# Patient Record
Sex: Male | Born: 1935 | Race: White | Hispanic: No | Marital: Married | Smoking: Former smoker
Health system: Southern US, Community
[De-identification: ages and names within clinical notes are randomized; demographics above are authoritative.]

## PROBLEM LIST (undated history)

## (undated) DIAGNOSIS — E538 Deficiency of other specified B group vitamins: Secondary | ICD-10-CM

## (undated) DIAGNOSIS — F419 Anxiety disorder, unspecified: Secondary | ICD-10-CM

## (undated) DIAGNOSIS — IMO0001 Reserved for inherently not codable concepts without codable children: Secondary | ICD-10-CM

## (undated) DIAGNOSIS — E785 Hyperlipidemia, unspecified: Secondary | ICD-10-CM

## (undated) DIAGNOSIS — Z992 Dependence on renal dialysis: Secondary | ICD-10-CM

## (undated) DIAGNOSIS — E669 Obesity, unspecified: Secondary | ICD-10-CM

## (undated) DIAGNOSIS — Z9289 Personal history of other medical treatment: Secondary | ICD-10-CM

## (undated) DIAGNOSIS — I10 Essential (primary) hypertension: Secondary | ICD-10-CM

## (undated) DIAGNOSIS — I2510 Atherosclerotic heart disease of native coronary artery without angina pectoris: Secondary | ICD-10-CM

## (undated) DIAGNOSIS — G4733 Obstructive sleep apnea (adult) (pediatric): Secondary | ICD-10-CM

## (undated) DIAGNOSIS — I255 Ischemic cardiomyopathy: Secondary | ICD-10-CM

## (undated) DIAGNOSIS — K219 Gastro-esophageal reflux disease without esophagitis: Secondary | ICD-10-CM

## (undated) DIAGNOSIS — M4800 Spinal stenosis, site unspecified: Secondary | ICD-10-CM

## (undated) DIAGNOSIS — D649 Anemia, unspecified: Secondary | ICD-10-CM

## (undated) DIAGNOSIS — N186 End stage renal disease: Secondary | ICD-10-CM

## (undated) DIAGNOSIS — R251 Tremor, unspecified: Secondary | ICD-10-CM

## (undated) DIAGNOSIS — N200 Calculus of kidney: Secondary | ICD-10-CM

## (undated) DIAGNOSIS — M1990 Unspecified osteoarthritis, unspecified site: Secondary | ICD-10-CM

## (undated) HISTORY — PX: CATARACT EXTRACTION: SUR2

## (undated) HISTORY — DX: Atherosclerotic heart disease of native coronary artery without angina pectoris: I25.10

## (undated) HISTORY — PX: CARDIAC CATHETERIZATION: SHX172

## (undated) HISTORY — PX: EYE SURGERY: SHX253

## (undated) HISTORY — DX: Essential (primary) hypertension: I10

## (undated) HISTORY — DX: Deficiency of other specified B group vitamins: E53.8

## (undated) HISTORY — DX: Dependence on renal dialysis: N18.6

## (undated) HISTORY — DX: Hyperlipidemia, unspecified: E78.5

## (undated) HISTORY — PX: BACK SURGERY: SHX140

## (undated) HISTORY — DX: Calculus of kidney: N20.0

## (undated) HISTORY — DX: Obstructive sleep apnea (adult) (pediatric): G47.33

## (undated) HISTORY — DX: Spinal stenosis, site unspecified: M48.00

## (undated) HISTORY — DX: Personal history of other medical treatment: Z92.89

## (undated) HISTORY — DX: Ischemic cardiomyopathy: I25.5

## (undated) HISTORY — PX: JOINT REPLACEMENT: SHX530

## (undated) HISTORY — DX: End stage renal disease: Z99.2

## (undated) HISTORY — DX: Obesity, unspecified: E66.9

## (undated) HISTORY — PX: INSERTION OF DIALYSIS CATHETER: SHX1324

---

## 1997-08-22 HISTORY — PX: CORONARY ARTERY BYPASS GRAFT: SHX141

## 2005-08-25 ENCOUNTER — Ambulatory Visit: Payer: Self-pay | Admitting: Gastroenterology

## 2005-09-10 ENCOUNTER — Emergency Department: Payer: Self-pay | Admitting: Emergency Medicine

## 2005-09-13 ENCOUNTER — Emergency Department: Payer: Self-pay | Admitting: Emergency Medicine

## 2005-09-14 ENCOUNTER — Ambulatory Visit: Payer: Self-pay | Admitting: Urology

## 2005-09-14 ENCOUNTER — Other Ambulatory Visit: Payer: Self-pay

## 2006-09-05 ENCOUNTER — Ambulatory Visit: Payer: Self-pay | Admitting: Internal Medicine

## 2008-12-20 HISTORY — PX: TOTAL KNEE ARTHROPLASTY: SHX125

## 2008-12-23 ENCOUNTER — Encounter: Payer: Self-pay | Admitting: Cardiology

## 2008-12-29 ENCOUNTER — Ambulatory Visit: Payer: Self-pay | Admitting: General Practice

## 2008-12-31 ENCOUNTER — Encounter: Payer: Self-pay | Admitting: Cardiology

## 2009-01-06 ENCOUNTER — Ambulatory Visit: Payer: Self-pay | Admitting: Internal Medicine

## 2009-01-06 ENCOUNTER — Encounter: Payer: Self-pay | Admitting: Cardiology

## 2009-01-08 ENCOUNTER — Ambulatory Visit: Payer: Self-pay | Admitting: Ophthalmology

## 2009-01-12 ENCOUNTER — Inpatient Hospital Stay: Payer: Self-pay | Admitting: General Practice

## 2009-02-17 ENCOUNTER — Encounter: Payer: Self-pay | Admitting: Cardiology

## 2009-03-11 ENCOUNTER — Encounter: Payer: Self-pay | Admitting: Cardiology

## 2009-03-12 ENCOUNTER — Encounter: Payer: Self-pay | Admitting: Cardiology

## 2009-03-31 ENCOUNTER — Ambulatory Visit: Payer: Self-pay | Admitting: Ophthalmology

## 2009-06-09 ENCOUNTER — Encounter: Payer: Self-pay | Admitting: Cardiology

## 2009-06-22 DIAGNOSIS — Z9289 Personal history of other medical treatment: Secondary | ICD-10-CM

## 2009-06-22 HISTORY — DX: Personal history of other medical treatment: Z92.89

## 2009-07-02 ENCOUNTER — Ambulatory Visit: Payer: Self-pay | Admitting: Cardiology

## 2009-07-02 DIAGNOSIS — E785 Hyperlipidemia, unspecified: Secondary | ICD-10-CM | POA: Insufficient documentation

## 2009-07-02 DIAGNOSIS — I25810 Atherosclerosis of coronary artery bypass graft(s) without angina pectoris: Secondary | ICD-10-CM | POA: Insufficient documentation

## 2009-07-02 DIAGNOSIS — I10 Essential (primary) hypertension: Secondary | ICD-10-CM | POA: Insufficient documentation

## 2009-07-08 LAB — CONVERTED CEMR LAB
BUN: 28 mg/dL — ABNORMAL HIGH (ref 6–23)
Chloride: 104 meq/L (ref 96–112)
Glucose, Bld: 109 mg/dL — ABNORMAL HIGH (ref 70–99)
Potassium: 4.9 meq/L (ref 3.5–5.3)
Sodium: 138 meq/L (ref 135–145)

## 2009-07-09 ENCOUNTER — Ambulatory Visit (HOSPITAL_COMMUNITY): Admission: RE | Admit: 2009-07-09 | Discharge: 2009-07-09 | Payer: Self-pay | Admitting: Cardiology

## 2009-07-09 ENCOUNTER — Ambulatory Visit: Payer: Self-pay | Admitting: Cardiology

## 2009-07-17 ENCOUNTER — Ambulatory Visit: Payer: Self-pay | Admitting: Cardiology

## 2009-07-17 DIAGNOSIS — R0609 Other forms of dyspnea: Secondary | ICD-10-CM

## 2009-07-17 DIAGNOSIS — R0602 Shortness of breath: Secondary | ICD-10-CM

## 2009-07-17 DIAGNOSIS — R0989 Other specified symptoms and signs involving the circulatory and respiratory systems: Secondary | ICD-10-CM

## 2009-07-31 ENCOUNTER — Ambulatory Visit: Payer: Self-pay | Admitting: Cardiology

## 2009-08-02 ENCOUNTER — Encounter: Payer: Self-pay | Admitting: Cardiology

## 2009-08-03 LAB — CONVERTED CEMR LAB
Albumin: 4.2 g/dL (ref 3.5–5.2)
Alkaline Phosphatase: 50 units/L (ref 39–117)
BUN: 24 mg/dL — ABNORMAL HIGH (ref 6–23)
CO2: 24 meq/L (ref 19–32)
Cholesterol: 142 mg/dL (ref 0–200)
Glucose, Bld: 159 mg/dL — ABNORMAL HIGH (ref 70–99)
HDL: 48 mg/dL (ref >39)
LDL Cholesterol: 69 mg/dL (ref 0–99)
Total Bilirubin: 0.5 mg/dL (ref 0.3–1.2)
Triglycerides: 125 mg/dL (ref <150)
VLDL: 25 mg/dL (ref 0–40)

## 2009-08-10 ENCOUNTER — Ambulatory Visit: Payer: Self-pay | Admitting: Cardiovascular Disease

## 2009-08-10 ENCOUNTER — Encounter: Payer: Self-pay | Admitting: Cardiology

## 2009-08-19 ENCOUNTER — Encounter: Payer: Self-pay | Admitting: Cardiology

## 2009-08-19 ENCOUNTER — Ambulatory Visit: Payer: Self-pay | Admitting: Cardiology

## 2009-08-19 ENCOUNTER — Telehealth: Payer: Self-pay | Admitting: Cardiology

## 2009-08-19 DIAGNOSIS — G4733 Obstructive sleep apnea (adult) (pediatric): Secondary | ICD-10-CM | POA: Insufficient documentation

## 2009-08-19 DIAGNOSIS — Z9989 Dependence on other enabling machines and devices: Secondary | ICD-10-CM

## 2009-08-24 LAB — CONVERTED CEMR LAB
BUN: 29 mg/dL — ABNORMAL HIGH (ref 6–23)
Calcium: 8.9 mg/dL (ref 8.4–10.5)
Glucose, Bld: 126 mg/dL — ABNORMAL HIGH (ref 70–99)
Potassium: 5.2 meq/L (ref 3.5–5.3)

## 2009-08-28 ENCOUNTER — Ambulatory Visit: Payer: Self-pay | Admitting: Cardiology

## 2009-09-11 ENCOUNTER — Encounter: Payer: Self-pay | Admitting: Cardiology

## 2009-09-11 ENCOUNTER — Ambulatory Visit: Payer: Self-pay | Admitting: Cardiovascular Disease

## 2009-09-14 LAB — CONVERTED CEMR LAB
Calcium: 8.8 mg/dL (ref 8.4–10.5)
Glucose, Bld: 200 mg/dL — ABNORMAL HIGH (ref 70–99)
Potassium: 5.2 meq/L (ref 3.5–5.3)
Sodium: 138 meq/L (ref 135–145)

## 2009-09-16 LAB — CONVERTED CEMR LAB
BUN: 29 mg/dL — ABNORMAL HIGH (ref 6–23)
Chloride: 100 meq/L (ref 96–112)
Creatinine, Ser: 1.15 mg/dL (ref 0.40–1.50)
Glucose, Bld: 122 mg/dL — ABNORMAL HIGH (ref 70–99)
Potassium: 5.6 meq/L — ABNORMAL HIGH (ref 3.5–5.3)

## 2009-09-25 ENCOUNTER — Ambulatory Visit: Payer: Self-pay | Admitting: Cardiovascular Disease

## 2009-09-25 ENCOUNTER — Encounter: Payer: Self-pay | Admitting: Cardiology

## 2009-10-08 ENCOUNTER — Encounter: Payer: Self-pay | Admitting: Cardiology

## 2009-10-09 ENCOUNTER — Telehealth (INDEPENDENT_AMBULATORY_CARE_PROVIDER_SITE_OTHER): Payer: Self-pay | Admitting: *Deleted

## 2009-10-14 LAB — CONVERTED CEMR LAB
CO2: 22 meq/L (ref 19–32)
Chloride: 106 meq/L (ref 96–112)
Glucose, Bld: 136 mg/dL — ABNORMAL HIGH (ref 70–99)
Potassium: 5.7 meq/L — ABNORMAL HIGH (ref 3.5–5.3)
Sodium: 138 meq/L (ref 135–145)

## 2009-10-16 ENCOUNTER — Ambulatory Visit: Payer: Self-pay | Admitting: Cardiovascular Disease

## 2009-10-16 ENCOUNTER — Encounter: Payer: Self-pay | Admitting: Cardiology

## 2009-10-19 LAB — CONVERTED CEMR LAB
BUN: 25 mg/dL — ABNORMAL HIGH (ref 6–23)
CO2: 21 meq/L (ref 19–32)
Calcium: 9.2 mg/dL (ref 8.4–10.5)
Chloride: 102 meq/L (ref 96–112)
Creatinine, Ser: 1.38 mg/dL (ref 0.40–1.50)
Glucose, Bld: 237 mg/dL — ABNORMAL HIGH (ref 70–99)

## 2010-09-21 NOTE — Assessment & Plan Note (Signed)
Summary: F6W/AMD   Visit Type:  Follow-up Referring Provider:  self Primary Provider:  Dr [facility]  CC:  no cp, some sob, no edema, and blood pressure been up a little.  History of Present Illness: 77 yo with history of CAD s/p CABG, mild ischemic cardiomyopathy, and HTN returns followup.  Patient had CABG in 1999 with LIMA-LAD and SVG-OM1.  Symptoms prior to CABG were fatigue and exertional shortness of breath, no chest pain.  Patient did well for a number of years, but for the last couple of years he has noted some dyspnea on exertion.  He was scheduled for knee replacement this summer, and prior to the procedure, he had a [place] myoview done.  This showed EF 45% with lateral hypokinesis and a mixture of ischemia and infarction in the inferolateral and inferoapical walls.  Left heart cath showed totally occluded SVG-OM1 with patent LIMA-LAD.  There was 99% proximal LAD stenosis and 80% ostial left main stenosis.  The circumflex system had only nonobstructive disease.  The patient had his knee replacement in May with no complications.    I reviewed the left heart cath from [facility].  There is 70-80% ostial left main stenosis that would probably be amenable to stenting.  The LAD is protected by a patent LIMA.  Cardiac MRI showed EF 48% and akinetic mid inferolateral and mid anterolateral walls as well as the apex.  There was > 50% thickness subendocardial delayed enhancement in those walls.  This likely corresponds to circumflex distribution.    Patient is active again after surgery.  He does not get chest pain but did not get chest pain before CABG.  Prior to CABG, he had severe DOE (worse than his current symptoms).   His exercise tolerance is getting better as he exercises more (post-knee replacement).  He has been doing yardwork and is able to cut wood with a chainsaw.  He can walk on flat ground without trouble but has dyspnea with stairs.  He is tired all day.  He falls  asleep easily and per his wife, he snores loudly.  Sleep study showed moderate OSA and he is going to pick up CPAP machine on Monday.   Labs (11/10): creatinine 1.25 Labs (12/10): LDL 69, HDL 48 Labs (1/11): creatinine 1.2, K 5.2  Current Medications (verified): 1)  Aspirin 81 Mg Tbec (Aspirin) .... Take One Tablet By Mouth Daily 2)  Lisinopril 40 Mg Tabs (Lisinopril) .... Take One Tablet By Mouth Daily 3)  Metformin Hcl 1000 Mg Tabs (Metformin Hcl) .Marland Kitchen.. 1 By Mouth Two Times A Day 4)  Metoprolol Succinate 50 Mg Xr24h-Tab (Metoprolol Succinate) .... Take One Tablet By Mouth Daily 5)  Glipizide 5 Mg Tabs (Glipizide) .Marland Kitchen.. 1 By Mouth Two Times A Day 6)  Lovastatin 20 Mg Tabs (Lovastatin) .... Take One Tablet By Mouth Daily At Bedtime 7)  Cyanocobalamin 1000 Mcg/ml Soln (Cyanocobalamin) .Marland Kitchen.. 1 By Mouth Once Daily 8)  Leg Cramps Otc .Marland Kitchen.. 1 By Mouth Once Daily As Needed 9)  Imdur 30 Mg Xr24h-Tab (Isosorbide Mononitrate) .Marland Kitchen.. 1 Tab By Mouth Daily  Allergies (verified): 1)  ! * Beef 2)  ! * Ivp Dye 3)  ! Iodine  Past History:  Past Medical History: 1. DM 2. Hypertension 3. Hyperlipidemia 4. CAD: CABG 1999 in [place] with LIMA-LAD and SVG-OM1.  Lexiscan myoview (5/10) was done pre-op knee surgery.  This showed EF 45%, hypokinetic apex and lateral wall, fixed perfusion defect involving the apex, inferoapex, and  inferolateral wall.  There also was some inferoapical ischemia.  LHC (5/10) showed patent LIMA-LAD and totally occluded SVG-OM1, 99% pLAD, 40% ostial CFX, 40% mCFX, 30% OM1, 20% pRCA.  Cardiac MRI (11/10): EF 48%, mid anterolateral and inferolateral walls, apical lateral wall, apical septal wall, and true apex were all thin and akinetic.  51-75% thickness subendocardial delayed enhancement in the mid anterolateral and inferolateral walls, full thickness enhancement in the apical lateral and apical septal walls and the true apex.   5. Nephrolithiasis 6. Spinal stenosis with history of  back surgery.  7. TKR 5/10 8. B12 deficiency 9. Obesity 10.  Moderate OSA  Family History: Reviewed history from 07/02/2009 and no changes required. Father: deceased 49; heart failure Mother: deceased 26; renal failure  Social History: Reviewed history from 07/02/2009 and no changes required. Tobacco Use - Former.  Alcohol Use - no Retired, lives in [place]  Married, 1 daughter in [place] and 3 grandchildren.  Daughter has a country music band.   Review of Systems       All systems reviewed and negative except as per HPI.   Vital Signs:  Patient profile:   77 year old male Height:      67 inches Weight:      198.50 pounds BMI:     31.20 Pulse rate:   88 / minute Pulse rhythm:   regular BP sitting:   147 / 70  (left arm) Cuff size:   regular  Vitals Entered By: Mercer Pod (August 28, 2009 9:39 AM)  Physical Exam  General:  Well developed, well nourished, in no acute distress. Obese.  Neck:  Neck supple, no JVD. No masses, thyromegaly or abnormal cervical nodes. Lungs:  Clear bilaterally to auscultation and percussion. Heart:  Non-displaced PMI, chest non-tender; regular rate and rhythm, S1, S2 without rubs or gallops. 1/6 HSM at apex.  Carotid upstroke normal, no bruit. 1+ PT pulses bilaterally. No edema, no varicosities. Abdomen:  Bowel sounds positive; abdomen soft and non-tender without masses, organomegaly, or hernias noted. No hepatosplenomegaly. Extremities:  No clubbing or cyanosis. Neurologic:  Alert and oriented x 3. Psych:  Normal affect.   Impression & Recommendations:  Problem # 1:  CORONARY ATHEROSCLEROSIS OF ARTERY BYPASS GRAFT (ICD-414.04) Kenneth [name] was found on recent left heart cath to have an occluded graft to OM1.  His LIMA-LAD was patent.  His circumflex system is now supplied by a left main with 80% stenosis.  It is certainly possible that he is ischemic in the circumflex territory and this could be causing symptoms.  Lexiscan myoview  from 5/10 showed primarily fixed defect but some reversibility in the inferolateral and inferoapical walls.  The possibility of intervention on the left main has been raised to the patient in the past.  Upon reviewing the catheterization, I think this would be feasible, especially as he has a protected LAD (LIMA).  Cardiac MRI showed EF 48% with > 50% thickness subendocardial delayed enhancement in the mid anterolateral, mid inferolateral, apical lateral, and apical septal segments.  These segments would probably be unlikely to show significant function recovery with revascularization.  I am somewhat surprised about the degree of infarction in the circumflex territory as there is good flow at rest down this vessel.  Kenneth [name] has been doing well lately and his exercise tolerance is improving gradually over time as he gets more active after knee surgery.  I am not convinced that he is having ischemic symptoms.  I would hold off  on any intervention at this time.  I think he will feel better after starting CPAP as well given his significant OSA.    Problem # 2:  CARDIOMYOPATHY, ISCHEMIC (ICD-414.8) EF 48% on cardiac MRI, mildly decreased.  He is on Toprol XL and ACEI.   Problem # 3:  SLEEP APNEA (ICD-780.57) Moderate OSA.  He will pick up his CPAP on Monday.  Hopefully this will improve his fatigue/tiredness.   Problem # 4:  HYPERTENSION, UNSPECIFIED (ICD-401.9) BP has been running high, both here and at home.  Add chlorthalidone 12.5 mg daily (will help lower his potassium level as well).  BP check and BMET in 2 weeks, can titrate up to 25 mg daily if BP still elevated.   Problem # 5:  HYPERLIPIDEMIA-MIXED (ICD-272.4) Lipids at goal (LDL < 70) when last checked.   Patient Instructions: 1)  Your physician recommends that you schedule a follow-up appointment in: 4 months 2)  Your physician recommends that you return for lab work in: 2 weeks (BMET) 3)  Your physician has recommended you make the  following change in your medication: start chlorthiadone 12.5 mg daily 4)  Your physician has requested that you regularly monitor and record your blood pressure readings at home.  Please use the same machine at the same time of day to check your readings and record them to bring to your lab visit. Prescriptions: CHLORTHALIDONE 25 MG TABS (CHLORTHALIDONE) Take 1/2 tablet by mouth daily  #30 x 6   Entered by:   Charlena Cross, RN, BSN   Authorized by:   Marca Ancona, MD   Signed by:   Charlena Cross, RN, BSN on 08/28/2009   Method used:   Electronically to        [facility] [phone]* (retail)       [address]       [place], [place]  [address]       Ph: [phone]       Fax: [phone]   RxID:   [id number]

## 2010-09-21 NOTE — Progress Notes (Signed)
  Recieved request form [facility] forwarded to [facility] for processing [facility]  October 09, 2009 8:22 AM

## 2011-03-10 ENCOUNTER — Encounter: Payer: Self-pay | Admitting: Cardiology

## 2011-08-02 ENCOUNTER — Emergency Department: Payer: Self-pay | Admitting: Emergency Medicine

## 2011-11-16 ENCOUNTER — Ambulatory Visit: Payer: Self-pay | Admitting: Urology

## 2011-11-16 LAB — POTASSIUM: Potassium: 4 mmol/L (ref 3.5–5.1)

## 2012-02-26 ENCOUNTER — Emergency Department: Payer: Self-pay | Admitting: Emergency Medicine

## 2012-02-26 LAB — LIPASE, BLOOD: Lipase: 166 U/L (ref 73–393)

## 2012-02-26 LAB — URINALYSIS, COMPLETE
Bilirubin,UR: NEGATIVE
Blood: NEGATIVE
Glucose,UR: 50 mg/dL (ref 0–75)
Specific Gravity: 1.024 (ref 1.003–1.030)
Squamous Epithelial: NONE SEEN

## 2012-02-26 LAB — CBC
HCT: 37.5 % — ABNORMAL LOW (ref 40.0–52.0)
HGB: 12.5 g/dL — ABNORMAL LOW (ref 13.0–18.0)
MCH: 31.5 pg (ref 26.0–34.0)
MCHC: 33.3 g/dL (ref 32.0–36.0)
MCV: 95 fL (ref 80–100)
RBC: 3.97 10*6/uL — ABNORMAL LOW (ref 4.40–5.90)

## 2012-02-26 LAB — COMPREHENSIVE METABOLIC PANEL
Alkaline Phosphatase: 74 U/L (ref 50–136)
BUN: 31 mg/dL — ABNORMAL HIGH (ref 7–18)
Bilirubin,Total: 0.5 mg/dL (ref 0.2–1.0)
Chloride: 105 mmol/L (ref 98–107)
Creatinine: 2.75 mg/dL — ABNORMAL HIGH (ref 0.60–1.30)
SGPT (ALT): 14 U/L
Total Protein: 7.4 g/dL (ref 6.4–8.2)

## 2012-02-28 ENCOUNTER — Ambulatory Visit: Payer: Self-pay | Admitting: Urology

## 2012-02-29 ENCOUNTER — Inpatient Hospital Stay: Payer: Self-pay | Admitting: Urology

## 2012-02-29 LAB — CBC
MCH: 31.2 pg (ref 26.0–34.0)
MCHC: 33 g/dL (ref 32.0–36.0)
Platelet: 221 10*3/uL (ref 150–440)

## 2012-02-29 LAB — BASIC METABOLIC PANEL
Calcium, Total: 8.3 mg/dL — ABNORMAL LOW (ref 8.5–10.1)
Co2: 28 mmol/L (ref 21–32)
Creatinine: 3.19 mg/dL — ABNORMAL HIGH (ref 0.60–1.30)
EGFR (Non-African Amer.): 17 — ABNORMAL LOW
Potassium: 5.4 mmol/L — ABNORMAL HIGH (ref 3.5–5.1)
Sodium: 135 mmol/L — ABNORMAL LOW (ref 136–145)

## 2012-03-01 LAB — BASIC METABOLIC PANEL WITH GFR
Anion Gap: 7
BUN: 30 mg/dL — ABNORMAL HIGH
Calcium, Total: 8 mg/dL — ABNORMAL LOW
Chloride: 104 mmol/L
Co2: 27 mmol/L
Creatinine: 2.74 mg/dL — ABNORMAL HIGH
EGFR (African American): 24 — ABNORMAL LOW
EGFR (Non-African Amer.): 21 — ABNORMAL LOW
Glucose: 140 mg/dL — ABNORMAL HIGH
Osmolality: 284
Potassium: 4.6 mmol/L
Sodium: 138 mmol/L

## 2012-03-02 LAB — CREATININE, SERUM: EGFR (African American): 41 — ABNORMAL LOW

## 2012-03-16 ENCOUNTER — Ambulatory Visit: Payer: Self-pay | Admitting: Urology

## 2012-03-19 DIAGNOSIS — Z85828 Personal history of other malignant neoplasm of skin: Secondary | ICD-10-CM | POA: Insufficient documentation

## 2012-04-25 ENCOUNTER — Ambulatory Visit: Payer: Self-pay | Admitting: Urology

## 2012-04-26 ENCOUNTER — Ambulatory Visit: Payer: Self-pay | Admitting: Urology

## 2012-04-30 ENCOUNTER — Ambulatory Visit: Payer: Self-pay | Admitting: Urology

## 2012-08-06 ENCOUNTER — Ambulatory Visit: Payer: Self-pay | Admitting: Urology

## 2014-02-10 ENCOUNTER — Emergency Department: Payer: Self-pay | Admitting: Emergency Medicine

## 2014-02-10 LAB — CBC
HCT: 38 % — AB (ref 40.0–52.0)
HGB: 12.5 g/dL — AB (ref 13.0–18.0)
MCH: 32.1 pg (ref 26.0–34.0)
MCHC: 32.9 g/dL (ref 32.0–36.0)
MCV: 97 fL (ref 80–100)
PLATELETS: 318 10*3/uL (ref 150–440)
RBC: 3.9 10*6/uL — AB (ref 4.40–5.90)
RDW: 13.7 % (ref 11.5–14.5)
WBC: 9 10*3/uL (ref 3.8–10.6)

## 2014-02-10 LAB — COMPREHENSIVE METABOLIC PANEL
ANION GAP: 9 (ref 7–16)
Albumin: 3.4 g/dL (ref 3.4–5.0)
Alkaline Phosphatase: 54 U/L
BUN: 32 mg/dL — ABNORMAL HIGH (ref 7–18)
Bilirubin,Total: 0.4 mg/dL (ref 0.2–1.0)
CALCIUM: 9.1 mg/dL (ref 8.5–10.1)
Chloride: 102 mmol/L (ref 98–107)
Co2: 25 mmol/L (ref 21–32)
Creatinine: 3.09 mg/dL — ABNORMAL HIGH (ref 0.60–1.30)
EGFR (African American): 20 — ABNORMAL LOW
EGFR (Non-African Amer.): 18 — ABNORMAL LOW
GLUCOSE: 157 mg/dL — AB (ref 65–99)
OSMOLALITY: 282 (ref 275–301)
Potassium: 4.8 mmol/L (ref 3.5–5.1)
SGOT(AST): 17 U/L (ref 15–37)
SGPT (ALT): 11 U/L — ABNORMAL LOW (ref 12–78)
Sodium: 136 mmol/L (ref 136–145)
TOTAL PROTEIN: 7.5 g/dL (ref 6.4–8.2)

## 2014-02-10 LAB — URINALYSIS, COMPLETE
Bacteria: NONE SEEN
Bilirubin,UR: NEGATIVE
KETONE: NEGATIVE
Leukocyte Esterase: NEGATIVE
Nitrite: NEGATIVE
PH: 5 (ref 4.5–8.0)
Specific Gravity: 1.019 (ref 1.003–1.030)
WBC UR: 4 /HPF (ref 0–5)

## 2014-02-10 LAB — BASIC METABOLIC PANEL
ANION GAP: 8 (ref 7–16)
BUN: 38 mg/dL — ABNORMAL HIGH (ref 7–18)
CALCIUM: 9.1 mg/dL (ref 8.5–10.1)
CREATININE: 3.07 mg/dL — AB (ref 0.60–1.30)
Chloride: 103 mmol/L (ref 98–107)
Co2: 25 mmol/L (ref 21–32)
EGFR (African American): 21 — ABNORMAL LOW
EGFR (Non-African Amer.): 18 — ABNORMAL LOW
GLUCOSE: 147 mg/dL — AB (ref 65–99)
OSMOLALITY: 284 (ref 275–301)
POTASSIUM: 5.3 mmol/L — AB (ref 3.5–5.1)
Sodium: 136 mmol/L (ref 136–145)

## 2014-02-22 ENCOUNTER — Emergency Department: Payer: Self-pay | Admitting: Emergency Medicine

## 2014-02-22 LAB — COMPREHENSIVE METABOLIC PANEL
ALT: 10 U/L — AB (ref 12–78)
Albumin: 2.8 g/dL — ABNORMAL LOW (ref 3.4–5.0)
Alkaline Phosphatase: 53 U/L
Anion Gap: 6 — ABNORMAL LOW (ref 7–16)
BUN: 31 mg/dL — AB (ref 7–18)
Bilirubin,Total: 0.3 mg/dL (ref 0.2–1.0)
CREATININE: 3.02 mg/dL — AB (ref 0.60–1.30)
Calcium, Total: 8.3 mg/dL — ABNORMAL LOW (ref 8.5–10.1)
Chloride: 105 mmol/L (ref 98–107)
Co2: 23 mmol/L (ref 21–32)
EGFR (African American): 21 — ABNORMAL LOW
EGFR (Non-African Amer.): 18 — ABNORMAL LOW
GLUCOSE: 159 mg/dL — AB (ref 65–99)
Osmolality: 278 (ref 275–301)
Potassium: 5.4 mmol/L — ABNORMAL HIGH (ref 3.5–5.1)
SGOT(AST): 24 U/L (ref 15–37)
SODIUM: 134 mmol/L — AB (ref 136–145)
TOTAL PROTEIN: 7.1 g/dL (ref 6.4–8.2)

## 2014-02-22 LAB — CBC WITH DIFFERENTIAL/PLATELET
BASOS PCT: 0.7 %
Basophil #: 0.1 10*3/uL (ref 0.0–0.1)
EOS ABS: 0.1 10*3/uL (ref 0.0–0.7)
Eosinophil %: 0.9 %
HCT: 32.1 % — AB (ref 40.0–52.0)
HGB: 10.8 g/dL — AB (ref 13.0–18.0)
LYMPHS PCT: 11 %
Lymphocyte #: 1 10*3/uL (ref 1.0–3.6)
MCH: 32.3 pg (ref 26.0–34.0)
MCHC: 33.6 g/dL (ref 32.0–36.0)
MCV: 96 fL (ref 80–100)
MONOS PCT: 8.5 %
Monocyte #: 0.8 x10 3/mm (ref 0.2–1.0)
NEUTROS PCT: 78.9 %
Neutrophil #: 7.1 10*3/uL — ABNORMAL HIGH (ref 1.4–6.5)
PLATELETS: 379 10*3/uL (ref 150–440)
RBC: 3.35 10*6/uL — AB (ref 4.40–5.90)
RDW: 13.6 % (ref 11.5–14.5)
WBC: 9 10*3/uL (ref 3.8–10.6)

## 2014-02-22 LAB — URINALYSIS, COMPLETE
BILIRUBIN, UR: NEGATIVE
Glucose,UR: 150 mg/dL (ref 0–75)
KETONE: NEGATIVE
LEUKOCYTE ESTERASE: NEGATIVE
NITRITE: NEGATIVE
PH: 5 (ref 4.5–8.0)
Protein: 30
Specific Gravity: 1.016 (ref 1.003–1.030)

## 2014-02-22 LAB — POTASSIUM: Potassium: 5 mmol/L (ref 3.5–5.1)

## 2014-02-22 LAB — TROPONIN I: TROPONIN-I: 0.02 ng/mL

## 2014-03-13 ENCOUNTER — Ambulatory Visit: Payer: Self-pay | Admitting: Gastroenterology

## 2014-03-14 LAB — PATHOLOGY REPORT

## 2014-05-26 DIAGNOSIS — E1122 Type 2 diabetes mellitus with diabetic chronic kidney disease: Secondary | ICD-10-CM | POA: Insufficient documentation

## 2014-10-06 DIAGNOSIS — N529 Male erectile dysfunction, unspecified: Secondary | ICD-10-CM | POA: Insufficient documentation

## 2014-12-09 NOTE — Op Note (Signed)
PATIENT NAME:  Kenneth [name], Cabot T MR#:  [id number] DATE OF BIRTH:  12-21-1927  DATE OF PROCEDURE:  04/26/2012  PREOPERATIVE DIAGNOSIS: Left renal calculi.   POSTOPERATIVE DIAGNOSIS: Left renal calculi.  PROCEDURE: Left ureteropyeloscopy with holmium laser lithotripsy.   SURGEON: Irineo AxonScott Zayed Griffie, MD  ASSISTANT: None.   ANESTHESIA: General.   INDICATIONS: The patient is an 81 year old male with a history of a left proximal ureteral calculi. He underwent prior attempt at ureteroscopic removal; however, the flexible ureteroscope was not able to access the proximal ureter. A ureteral stent was placed. After discussion of treatment options, he has elected an attempt at repeat ureteroscopy.   DESCRIPTION OF PROCEDURE: The patient was taken to the Operating Room where a general anesthetic was administered. He was placed in the low lithotomy position, and his external genitalia were prepped and draped in the usual fashion. A 21-French  cystoscope with 30 degrees lens was lubricated and passed per urethra. The urethra was normal in caliber without stricture. The prostate was remarkable for lateral lobe enlargement. The bladder mucosa was inspected, and no solid or papillary lesions were noted. There was mild mucosal edema at the bladder where the stent exited. A 0.035 guidewire was placed through the cystoscope and into the left ureter, passed up the left renal pelvis under fluoroscopic guidance. The cystoscope was removed and then repassed alongside the wire. The indwelling stent was grasped with forceps and brought out through the urethral meatus. A second 0.025 guidewire was placed through the stent and up into the renal pelvis and the stent was removed. An 8-French flexible digital ureteroscope was then placed over the 0.025 wire and advanced up into the left renal pelvis without difficulty. The ureter showed no stones on passage of the scope. In the left lower pole calyx, the left renal calculi were  identified. A 273 micron holmium laser fiber was placed through the ureteroscope. An initial setting of 0.2 joules and 50 hertz fragmentation of  stone was started. This was subsequently increased to 0.3 joules and 50 hertz. The stones were fragmented into multiple small fragments using the "popcorn settings." At the completion of the procedure, fragments were closely inspected, and no larger fragments were found, and it was felt that these should be passable. Retrograde pyelogram was performed, and no other large fragments were seen on fluoroscopy. The ureteroscope was withdrawn and no stones or larger fragments were seen in the ureter. A 6-French/22 cm Microvasive Contour ureteral stent was placed over the previously placed guidewire. There was good curl seen in the renal pelvis. The cystoscope was repassed, and the distal end of the stent was well positioned in the bladder. The stent was left attached to a string and will be removed at a later date. A B and O suppository was placed per rectum. The patient was taken to the PAC-U in stable condition. There were no complications. Estimated blood loss was minimal.  ____________________________ Verna CzechScott C. Lonna CobbStoioff, MD scs:cbb D: 04/26/2012 10:58:09 ET T: 04/26/2012 11:17:06 ET JOB#: [id number]  cc: Lorin PicketScott C. Lonna CobbStoioff, MD, <Dictator> Riki AltesSCOTT C Rubina Basinski MD ELECTRONICALLY SIGNED 04/27/2012 12:22

## 2014-12-14 NOTE — Op Note (Signed)
PATIENT NAME:  Kenneth Kenneth [name], Kenneth Kenneth [name] MR#:  [id number] DATE OF BIRTH:  04-04-1928  DATE OF PROCEDURE:  03/01/2012  PREOPERATIVE DIAGNOSES:  1. Left ureteral calculi.  2. Left renal colic.   POSTOPERATIVE DIAGNOSES:  1. Left ureteral calculi.  2. Left renal colic.   PROCEDURES:  1. Cystoscopy.  2. Left ureteroscopy.  3. Extraction of calculus within the bladder.  4. Placement of left ureteral stent.   SURGEON: Kenneth Kretz C. Kenneth CobbStoioff, MD   ASSISTANT: None.   ANESTHESIA: General.   INDICATION: The patient is an 81 year old male admitted July 10th with severe left renal colic. CT scan showed two stones in the proximal ureter measuring 4 to 5 mm and a 5 mm stone in the dependent portion of the renal pelvis. He also had a nonobstructing left lower pole stone. Stones could not be visualized on plain film with the exception of the lower pole renal calculus, so lithotripsy could not be performed. He desired an attempt at ureteroscopic removal.   DESCRIPTION OF PROCEDURE: The patient was taken to the Operating Room where a general anesthetic was administered. He was placed in the low lithotomy position, and his external genitalia were prepped and draped in the usual fashion. A timeout was performed per protocol. A 21-French cystoscope was lubricated and passed under direct vision. The urethra was normal in caliber without stricture. The prostate shows moderate lateral lobe enlargement and moderate bladder neck elevation. The bladder mucosa was closely inspected. There was mild trabeculation present. There are no bladder mucosal lesions noted, including solid or papillary lesions. The right ureteral orifice was normal-appearing and clear efflux was noted. No efflux was noted from the left ureter. A 0.035 guidewire was placed through the cystoscope and into the left ureteral orifice. A guidewire was easily passed up into the renal pelvis, and upon entry of the wire into the vicinity of the pelvis marked rush of  urine was noted from the left ureteral orifice which was clear with a small amount of sediment. The cystoscope was removed. A 6-French semirigid ureteroscope was lubricated and passed under direct vision. The left ureteral orifice was engaged without dilation. The scope was advanced proximally. It was placed in the lower proximal ureter and no stone was seen, and the limits of the ureteroscope were reached. The scope was removed, and a longer semirigid ureteroscope was attempted; however, it could not be advanced beyond the mid ureter. A second guidewire was placed and the semirigid ureteroscope was removed. A flexible digital ureteroscope was advanced over the wire in the distal ureter, where the scope would not be advanced beyond the distal ureter secondary to a narrowing. The ureteroscope was removed, and over the wire ureteral dilators were passed up to a 12-French. A ureteral access sheath or the ureteroscope could not be advanced beyond this area. The ureter would not accept a ureteral access sheath. It was elected at this point to place a left ureteral stent. A 6-French/24 cm Microvasive Contour ureteral stent was placed over the previously placed wire. There was good curl seen in the renal pelvis under fluoroscopy. The distal end of the stent was well positioned in the bladder. An approximately 3 mm stone was noted in the bladder upon cystoscope introduction, and this was removed with grasping forceps and will be sent for analysis. A B and S suppository was placed per rectum. He was taken to the PAC-U in stable condition.   ESTIMATED BLOOD LOSS: Minimal.   ____________________________ Kenneth CzechScott C. Ukiah Trawick, MD scs:cbb D:  03/01/2012 17:23:49 ET Kenneth [name]: 03/01/2012 18:36:28 ET JOB#: [id number]  cc: Kenneth Picket C. Kenneth Cobb, MD, <Dictator> Kenneth Altes MD ELECTRONICALLY SIGNED 03/05/2012 9:44

## 2014-12-14 NOTE — Op Note (Signed)
PATIENT NAME:  Kenneth [name], [name] MR#:  [id number] DATE OF BIRTH:  03-Nov-1927  DATE OF PROCEDURE:  11/16/2011  PREOPERATIVE DIAGNOSIS: Right hydrocele.   POSTOPERATIVE DIAGNOSIS: Right hydrocele.   PROCEDURE: Right hydrocelectomy.   SURGEON: Darling Cieslewicz C. Lonna CobbStoioff, MD  ASSISTANT: None.   ANESTHESIA: General.   INDICATIONS: 81 year old male with a long history of a minimally symptomatic right hydrocele. Over the past 12 months he has noted gradual enlargement and the hydrocele has become bothersome. After discussion of treatment option, he has elected to proceed with hydrocelectomy.   DESCRIPTION OF PROCEDURE: Patient was taken to the Operating Room where a general anesthetic was administered. He was placed in supine position and his external genitalia were prepped and draped sterilely. Surgical site was previously marked in the staging area. Timeout was performed. A transverse right hemi-scrotal incision was made, carried through dartos down to the hydrocele sac. Hydrocele sac was separated with blunt dissection and cautery from the dartos and delivered into the operative field. The hydrocele sac was incised anteriorly and approximately 200 mL of straw-colored fluid was aspirated. The hydrocele sac was opened anteriorly with cautery. Testis, epididymis and cord structures were normal in appearance. The hydrocele sac was excised with the Harmonic scalpel leaving approximately 1 cm rim around the testis and cord structures. No bleeding was noted. The testis was placed back into the right hemiscrotum in its anatomic position. A quarter-inch Penrose drain was placed through a separate stab incision in the dependent portion of the scrotum and secured with 3-0 nylon. Skin was anesthetized with 6 mL of 0.5% plain Sensorcaine. The dartos was closed with a running 3-0 chromic in a locking suture. Skin was closed with a 3-0 chromic running horizontal mattress suture. Dressing of Telfa, fluffs and scrotal support was  placed. He was taken to PAC-U in stable condition.   There were no complications. EBL minimal.   ____________________________ Verna CzechScott C. Lonna CobbStoioff, MD scs:cms D: 11/16/2011 11:20:28 ET T: 11/16/2011 12:07:40 ET JOB#: [id number]  cc: Lorin PicketScott C. Lonna CobbStoioff, MD, <Dictator> Riki AltesSCOTT C Leeasia Secrist MD ELECTRONICALLY SIGNED 11/23/2011 11:57

## 2015-08-24 ENCOUNTER — Emergency Department: Payer: Medicare Other

## 2015-08-24 ENCOUNTER — Encounter: Payer: Self-pay | Admitting: Emergency Medicine

## 2015-08-24 ENCOUNTER — Inpatient Hospital Stay
Admission: EM | Admit: 2015-08-24 | Discharge: 2015-09-04 | DRG: 311 | Disposition: A | Discharge status: Skilled Nursing Facility | Payer: Medicare Other | Attending: Internal Medicine | Admitting: Internal Medicine

## 2015-08-24 ENCOUNTER — Other Ambulatory Visit: Payer: Self-pay

## 2015-08-24 ENCOUNTER — Inpatient Hospital Stay (HOSPITAL_COMMUNITY)
Admit: 2015-08-24 | Discharge: 2015-08-24 | Disposition: A | Discharge status: Home / Self Care | Payer: Medicare Other | Attending: Cardiovascular Disease | Admitting: Cardiovascular Disease

## 2015-08-24 DIAGNOSIS — I248 Other forms of acute ischemic heart disease: Secondary | ICD-10-CM | POA: Insufficient documentation

## 2015-08-24 DIAGNOSIS — I25701 Atherosclerosis of coronary artery bypass graft(s), unspecified, with angina pectoris with documented spasm: Secondary | ICD-10-CM

## 2015-08-24 DIAGNOSIS — R0600 Dyspnea, unspecified: Secondary | ICD-10-CM

## 2015-08-24 DIAGNOSIS — J069 Acute upper respiratory infection, unspecified: Secondary | ICD-10-CM | POA: Diagnosis present

## 2015-08-24 DIAGNOSIS — M353 Polymyalgia rheumatica: Secondary | ICD-10-CM | POA: Diagnosis present

## 2015-08-24 DIAGNOSIS — G4733 Obstructive sleep apnea (adult) (pediatric): Secondary | ICD-10-CM | POA: Diagnosis present

## 2015-08-24 DIAGNOSIS — I255 Ischemic cardiomyopathy: Secondary | ICD-10-CM | POA: Diagnosis present

## 2015-08-24 DIAGNOSIS — R05 Cough: Secondary | ICD-10-CM

## 2015-08-24 DIAGNOSIS — R000 Tachycardia, unspecified: Secondary | ICD-10-CM

## 2015-08-24 DIAGNOSIS — E782 Mixed hyperlipidemia: Secondary | ICD-10-CM | POA: Insufficient documentation

## 2015-08-24 DIAGNOSIS — J449 Chronic obstructive pulmonary disease, unspecified: Secondary | ICD-10-CM | POA: Diagnosis present

## 2015-08-24 DIAGNOSIS — G9341 Metabolic encephalopathy: Secondary | ICD-10-CM | POA: Diagnosis present

## 2015-08-24 DIAGNOSIS — R0602 Shortness of breath: Secondary | ICD-10-CM | POA: Diagnosis not present

## 2015-08-24 DIAGNOSIS — I472 Ventricular tachycardia: Secondary | ICD-10-CM | POA: Diagnosis present

## 2015-08-24 DIAGNOSIS — I2510 Atherosclerotic heart disease of native coronary artery without angina pectoris: Secondary | ICD-10-CM | POA: Diagnosis present

## 2015-08-24 DIAGNOSIS — Z794 Long term (current) use of insulin: Secondary | ICD-10-CM

## 2015-08-24 DIAGNOSIS — F0390 Unspecified dementia without behavioral disturbance: Secondary | ICD-10-CM | POA: Diagnosis present

## 2015-08-24 DIAGNOSIS — I5032 Chronic diastolic (congestive) heart failure: Secondary | ICD-10-CM | POA: Diagnosis present

## 2015-08-24 DIAGNOSIS — R079 Chest pain, unspecified: Secondary | ICD-10-CM

## 2015-08-24 DIAGNOSIS — N184 Chronic kidney disease, stage 4 (severe): Secondary | ICD-10-CM

## 2015-08-24 DIAGNOSIS — Z87442 Personal history of urinary calculi: Secondary | ICD-10-CM | POA: Diagnosis not present

## 2015-08-24 DIAGNOSIS — I209 Angina pectoris, unspecified: Secondary | ICD-10-CM

## 2015-08-24 DIAGNOSIS — I130 Hypertensive heart and chronic kidney disease with heart failure and stage 1 through stage 4 chronic kidney disease, or unspecified chronic kidney disease: Secondary | ICD-10-CM | POA: Diagnosis present

## 2015-08-24 DIAGNOSIS — E669 Obesity, unspecified: Secondary | ICD-10-CM | POA: Diagnosis present

## 2015-08-24 DIAGNOSIS — J40 Bronchitis, not specified as acute or chronic: Secondary | ICD-10-CM | POA: Diagnosis not present

## 2015-08-24 DIAGNOSIS — E872 Acidosis: Secondary | ICD-10-CM | POA: Diagnosis present

## 2015-08-24 DIAGNOSIS — N170 Acute kidney failure with tubular necrosis: Secondary | ICD-10-CM | POA: Diagnosis not present

## 2015-08-24 DIAGNOSIS — Z96659 Presence of unspecified artificial knee joint: Secondary | ICD-10-CM | POA: Diagnosis present

## 2015-08-24 DIAGNOSIS — R509 Fever, unspecified: Secondary | ICD-10-CM

## 2015-08-24 DIAGNOSIS — R319 Hematuria, unspecified: Secondary | ICD-10-CM | POA: Diagnosis not present

## 2015-08-24 DIAGNOSIS — E7800 Pure hypercholesterolemia, unspecified: Secondary | ICD-10-CM | POA: Diagnosis present

## 2015-08-24 DIAGNOSIS — I214 Non-ST elevation (NSTEMI) myocardial infarction: Secondary | ICD-10-CM | POA: Diagnosis present

## 2015-08-24 DIAGNOSIS — Z888 Allergy status to other drugs, medicaments and biological substances status: Secondary | ICD-10-CM

## 2015-08-24 DIAGNOSIS — Z91041 Radiographic dye allergy status: Secondary | ICD-10-CM

## 2015-08-24 DIAGNOSIS — H5500 Unspecified nystagmus: Secondary | ICD-10-CM | POA: Diagnosis present

## 2015-08-24 DIAGNOSIS — N19 Unspecified kidney failure: Secondary | ICD-10-CM

## 2015-08-24 DIAGNOSIS — Z79899 Other long term (current) drug therapy: Secondary | ICD-10-CM | POA: Diagnosis not present

## 2015-08-24 DIAGNOSIS — Z87891 Personal history of nicotine dependence: Secondary | ICD-10-CM | POA: Diagnosis not present

## 2015-08-24 DIAGNOSIS — Z8546 Personal history of malignant neoplasm of prostate: Secondary | ICD-10-CM | POA: Diagnosis not present

## 2015-08-24 DIAGNOSIS — Z6829 Body mass index (BMI) 29.0-29.9, adult: Secondary | ICD-10-CM

## 2015-08-24 DIAGNOSIS — E1122 Type 2 diabetes mellitus with diabetic chronic kidney disease: Secondary | ICD-10-CM | POA: Diagnosis present

## 2015-08-24 DIAGNOSIS — E785 Hyperlipidemia, unspecified: Secondary | ICD-10-CM | POA: Diagnosis present

## 2015-08-24 DIAGNOSIS — I2489 Other forms of acute ischemic heart disease: Secondary | ICD-10-CM | POA: Insufficient documentation

## 2015-08-24 DIAGNOSIS — Z951 Presence of aortocoronary bypass graft: Secondary | ICD-10-CM

## 2015-08-24 DIAGNOSIS — J432 Centrilobular emphysema: Secondary | ICD-10-CM

## 2015-08-24 DIAGNOSIS — Z7984 Long term (current) use of oral hypoglycemic drugs: Secondary | ICD-10-CM | POA: Diagnosis not present

## 2015-08-24 DIAGNOSIS — I25810 Atherosclerosis of coronary artery bypass graft(s) without angina pectoris: Secondary | ICD-10-CM | POA: Insufficient documentation

## 2015-08-24 DIAGNOSIS — R059 Cough, unspecified: Secondary | ICD-10-CM

## 2015-08-24 DIAGNOSIS — H8112 Benign paroxysmal vertigo, left ear: Secondary | ICD-10-CM | POA: Diagnosis not present

## 2015-08-24 DIAGNOSIS — E538 Deficiency of other specified B group vitamins: Secondary | ICD-10-CM | POA: Diagnosis present

## 2015-08-24 DIAGNOSIS — R7989 Other specified abnormal findings of blood chemistry: Secondary | ICD-10-CM | POA: Diagnosis not present

## 2015-08-24 DIAGNOSIS — Z91018 Allergy to other foods: Secondary | ICD-10-CM

## 2015-08-24 DIAGNOSIS — Z7982 Long term (current) use of aspirin: Secondary | ICD-10-CM | POA: Diagnosis not present

## 2015-08-24 DIAGNOSIS — R270 Ataxia, unspecified: Secondary | ICD-10-CM

## 2015-08-24 DIAGNOSIS — D631 Anemia in chronic kidney disease: Secondary | ICD-10-CM | POA: Diagnosis present

## 2015-08-24 DIAGNOSIS — Z9989 Dependence on other enabling machines and devices: Secondary | ICD-10-CM

## 2015-08-24 LAB — APTT: APTT: 33 s (ref 24–36)

## 2015-08-24 LAB — URINALYSIS COMPLETE WITH MICROSCOPIC (ARMC ONLY)
Bilirubin Urine: NEGATIVE
GLUCOSE, UA: 50 mg/dL — AB
Ketones, ur: NEGATIVE mg/dL
Leukocytes, UA: NEGATIVE
Nitrite: NEGATIVE
Protein, ur: 100 mg/dL — AB
Specific Gravity, Urine: 1.018 (ref 1.005–1.030)
pH: 5 (ref 5.0–8.0)

## 2015-08-24 LAB — COMPREHENSIVE METABOLIC PANEL
ALT: 10 U/L — AB (ref 17–63)
AST: 23 U/L (ref 15–41)
Albumin: 3.7 g/dL (ref 3.5–5.0)
Alkaline Phosphatase: 62 U/L (ref 38–126)
Anion gap: 7 (ref 5–15)
BILIRUBIN TOTAL: 0.7 mg/dL (ref 0.3–1.2)
BUN: 36 mg/dL — AB (ref 6–20)
CALCIUM: 9 mg/dL (ref 8.9–10.3)
CO2: 26 mmol/L (ref 22–32)
CREATININE: 2.25 mg/dL — AB (ref 0.61–1.24)
Chloride: 104 mmol/L (ref 101–111)
GFR calc Af Amer: 29 mL/min — ABNORMAL LOW (ref >60)
GFR, EST NON AFRICAN AMERICAN: 25 mL/min — AB (ref >60)
Glucose, Bld: 177 mg/dL — ABNORMAL HIGH (ref 65–99)
Potassium: 4.6 mmol/L (ref 3.5–5.1)
Sodium: 137 mmol/L (ref 135–145)
TOTAL PROTEIN: 7.5 g/dL (ref 6.5–8.1)

## 2015-08-24 LAB — CBC
HEMATOCRIT: 36.5 % — AB (ref 40.0–52.0)
Hemoglobin: 12.1 g/dL — ABNORMAL LOW (ref 13.0–18.0)
MCH: 31.2 pg (ref 26.0–34.0)
MCHC: 33.2 g/dL (ref 32.0–36.0)
MCV: 93.8 fL (ref 80.0–100.0)
Platelets: 296 10*3/uL (ref 150–440)
RBC: 3.89 MIL/uL — AB (ref 4.40–5.90)
RDW: 13.6 % (ref 11.5–14.5)
WBC: 10.4 10*3/uL (ref 3.8–10.6)

## 2015-08-24 LAB — TROPONIN I
TROPONIN I: 0.18 ng/mL — AB (ref <0.031)
TROPONIN I: 0.17 ng/mL — AB (ref <0.031)

## 2015-08-24 LAB — GLUCOSE, CAPILLARY
GLUCOSE-CAPILLARY: 222 mg/dL — AB (ref 65–99)
Glucose-Capillary: 162 mg/dL — ABNORMAL HIGH (ref 65–99)

## 2015-08-24 LAB — LIPID PANEL
CHOLESTEROL: 113 mg/dL (ref 0–200)
HDL: 52 mg/dL (ref >40)
LDL CALC: 45 mg/dL (ref 0–99)
TRIGLYCERIDES: 81 mg/dL (ref <150)
Total CHOL/HDL Ratio: 2.2 RATIO
VLDL: 16 mg/dL (ref 0–40)

## 2015-08-24 LAB — PROTIME-INR
INR: 1.17
PROTHROMBIN TIME: 15.1 s — AB (ref 11.4–15.0)

## 2015-08-24 LAB — HEPARIN LEVEL (UNFRACTIONATED): Heparin Unfractionated: 0.19 IU/mL — ABNORMAL LOW (ref 0.30–0.70)

## 2015-08-24 MED ORDER — LISINOPRIL 20 MG PO TABS
20.0000 mg | ORAL_TABLET | Freq: Every day | ORAL | Status: DC
  Administered 2015-08-25 – 2015-08-26 (×2): 20 mg via ORAL
  Filled 2015-08-24 (×3): qty 1

## 2015-08-24 MED ORDER — SODIUM CHLORIDE 0.9 % IJ SOLN
3.0000 mL | Freq: Two times a day (BID) | INTRAMUSCULAR | Status: DC
  Administered 2015-08-25 – 2015-09-04 (×14): 3 mL via INTRAVENOUS

## 2015-08-24 MED ORDER — MORPHINE SULFATE (PF) 2 MG/ML IV SOLN: 1.0000 mg | INTRAVENOUS | Status: DC | PRN

## 2015-08-24 MED ORDER — HEPARIN BOLUS VIA INFUSION
2400.0000 [IU] | Freq: Once | INTRAVENOUS | Status: AC
  Administered 2015-08-25: 2400 [IU] via INTRAVENOUS
  Filled 2015-08-24: qty 2400

## 2015-08-24 MED ORDER — SODIUM CHLORIDE 0.9 % IJ SOLN
3.0000 mL | INTRAMUSCULAR | Status: DC | PRN
  Administered 2015-09-03: 3 mL via INTRAVENOUS
  Filled 2015-08-24: qty 10

## 2015-08-24 MED ORDER — ACETAMINOPHEN 325 MG PO TABS
650.0000 mg | ORAL_TABLET | Freq: Four times a day (QID) | ORAL | Status: DC | PRN
  Administered 2015-08-26 (×2): 650 mg via ORAL
  Filled 2015-08-24 (×2): qty 2

## 2015-08-24 MED ORDER — PANTOPRAZOLE SODIUM 40 MG PO TBEC
40.0000 mg | DELAYED_RELEASE_TABLET | Freq: Every day | ORAL | Status: DC
  Administered 2015-08-24 – 2015-09-04 (×11): 40 mg via ORAL
  Filled 2015-08-24 (×11): qty 1

## 2015-08-24 MED ORDER — ACETAMINOPHEN 650 MG RE SUPP: 650.0000 mg | Freq: Four times a day (QID) | RECTAL | Status: DC | PRN

## 2015-08-24 MED ORDER — SENNOSIDES-DOCUSATE SODIUM 8.6-50 MG PO TABS: 1.0000 | ORAL_TABLET | Freq: Every evening | ORAL | Status: DC | PRN

## 2015-08-24 MED ORDER — GLIPIZIDE 10 MG PO TABS
5.0000 mg | ORAL_TABLET | Freq: Every day | ORAL | Status: DC
  Administered 2015-08-25 – 2015-08-28 (×4): 5 mg via ORAL
  Filled 2015-08-24 (×5): qty 1

## 2015-08-24 MED ORDER — HEPARIN BOLUS VIA INFUSION
4000.0000 [IU] | Freq: Once | INTRAVENOUS | Status: AC
  Administered 2015-08-24: 4000 [IU] via INTRAVENOUS
  Filled 2015-08-24: qty 4000

## 2015-08-24 MED ORDER — SODIUM CHLORIDE 0.9 % IV SOLN
250.0000 mL | INTRAVENOUS | Status: DC | PRN
  Administered 2015-09-01: 1000 mL via INTRAVENOUS

## 2015-08-24 MED ORDER — METOPROLOL SUCCINATE ER 50 MG PO TB24
50.0000 mg | ORAL_TABLET | Freq: Every day | ORAL | Status: DC
  Administered 2015-08-25 – 2015-09-03 (×9): 50 mg via ORAL
  Filled 2015-08-24 (×10): qty 1

## 2015-08-24 MED ORDER — INSULIN ASPART 100 UNIT/ML ~~LOC~~ SOLN
0.0000 [IU] | Freq: Three times a day (TID) | SUBCUTANEOUS | Status: DC
  Administered 2015-08-24: 5 [IU] via SUBCUTANEOUS
  Administered 2015-08-25 – 2015-08-26 (×5): 3 [IU] via SUBCUTANEOUS
  Administered 2015-08-26: 5 [IU] via SUBCUTANEOUS
  Administered 2015-08-27: 3 [IU] via SUBCUTANEOUS
  Administered 2015-08-27 (×2): 2 [IU] via SUBCUTANEOUS
  Administered 2015-08-28 – 2015-08-30 (×8): 3 [IU] via SUBCUTANEOUS
  Administered 2015-08-30 – 2015-08-31 (×2): 2 [IU] via SUBCUTANEOUS
  Administered 2015-08-31: 3 [IU] via SUBCUTANEOUS
  Administered 2015-09-01: 5 [IU] via SUBCUTANEOUS
  Administered 2015-09-02 (×2): 3 [IU] via SUBCUTANEOUS
  Administered 2015-09-03: 5 [IU] via SUBCUTANEOUS
  Administered 2015-09-03: 3 [IU] via SUBCUTANEOUS
  Administered 2015-09-03: 2 [IU] via SUBCUTANEOUS
  Administered 2015-09-04: 3 [IU] via SUBCUTANEOUS
  Administered 2015-09-04: 2 [IU] via SUBCUTANEOUS
  Administered 2015-09-04: 5 [IU] via SUBCUTANEOUS
  Filled 2015-08-24 (×3): qty 3
  Filled 2015-08-24 (×3): qty 2
  Filled 2015-08-24: qty 5
  Filled 2015-08-24: qty 3
  Filled 2015-08-24: qty 5
  Filled 2015-08-24 (×4): qty 3
  Filled 2015-08-24: qty 2
  Filled 2015-08-24: qty 3
  Filled 2015-08-24 (×2): qty 5
  Filled 2015-08-24: qty 1
  Filled 2015-08-24: qty 2
  Filled 2015-08-24: qty 5
  Filled 2015-08-24 (×4): qty 3
  Filled 2015-08-24: qty 2
  Filled 2015-08-24 (×4): qty 3
  Filled 2015-08-24: qty 2

## 2015-08-24 MED ORDER — HEPARIN (PORCINE) IN NACL 100-0.45 UNIT/ML-% IJ SOLN
1250.0000 [IU]/h | INTRAMUSCULAR | Status: DC
  Administered 2015-08-25 (×2): 1250 [IU]/h via INTRAVENOUS
  Filled 2015-08-24 (×2): qty 250

## 2015-08-24 MED ORDER — PRAVASTATIN SODIUM 20 MG PO TABS
10.0000 mg | ORAL_TABLET | Freq: Every day | ORAL | Status: DC
  Administered 2015-08-24 – 2015-08-31 (×8): 10 mg via ORAL
  Filled 2015-08-24 (×9): qty 1

## 2015-08-24 MED ORDER — ASPIRIN EC 81 MG PO TBEC
81.0000 mg | DELAYED_RELEASE_TABLET | Freq: Every day | ORAL | Status: DC
  Administered 2015-08-25: 81 mg via ORAL
  Filled 2015-08-24 (×2): qty 1

## 2015-08-24 MED ORDER — INFLUENZA VAC SPLIT QUAD 0.5 ML IM SUSY
0.5000 mL | PREFILLED_SYRINGE | INTRAMUSCULAR | Status: DC
  Filled 2015-08-24: qty 0.5

## 2015-08-24 MED ORDER — HEPARIN (PORCINE) IN NACL 100-0.45 UNIT/ML-% IJ SOLN
12.0000 [IU]/kg/h | INTRAMUSCULAR | Status: DC
Start: 2015-08-24 — End: 2015-08-24
  Administered 2015-08-24: 12 [IU]/kg/h via INTRAVENOUS
  Filled 2015-08-24 (×2): qty 250

## 2015-08-24 NOTE — Progress Notes (Signed)
Patient alert and oriented x4. Oriented to room, unit, and call bell. Admission completed. No complaints at this time. Will cont to assess. Skin assessment verified by [name], RN. Telemetry box verified. Kenneth [name]

## 2015-08-24 NOTE — ED Provider Notes (Signed)
[facility] Emergency Department Provider Note  ____________________________________________  Time seen: On arrival  I have reviewed the triage vital signs and the nursing notes.   HISTORY  Chief Complaint Chest Pain    HPI Kenneth [name] is a 82 y.o. male who presents with complaints of chest tightness and weakness. He reports last night he felt chills and was shaking and felt so weak that he was unable to get out of bed. This morning he was able to ambulate with assistance but does describe some dizziness and nausea and continued chest tightness.. He has had a cough for a few days as well. He denies shortness of breath. No pleurisy. No recent travel. No calf pain or swelling.He does have a history of a CABG and has diabetes hypertension high cholesterol     Past Medical History  Diagnosis Date  . Diabetes mellitus   . Hyperlipidemia   . Hypertension   . Coronary artery disease     Lexiscan myoview (5/10) showed EF 45%, hypokinetic apex and lateral wall, fixed perfusion defect involving apex, inferoapex, and inferolateral wall, also some inferoapical ischemia; LHC (5/10) showed patent LIMA-LAD and totally occluded SVG-OM1, 99% pLAD, 40% ostial CFX, 40% mCFX, 30% OM1, 20% pRCA  . History of MRI of chest 11/10    Cardiac; EF 48%, mid anterolateral and inferolateral walls, apical lateral wall, apical septal wall, and true apex were all think and akinetic; 51-75% thickness subendocardial delayed enhancement in mid anterolateral and inferolateral walls, full thickness enhancement in apical laterla and apical septal walls and true apex  . Nephrolithiasis   . Spinal stenosis   . B12 deficiency   . Obesity   . OSA (obstructive sleep apnea)     Moderate    Patient Active Problem List   Diagnosis Date Noted  . SLEEP APNEA 08/19/2009  . DYSPNEA 07/17/2009  . SNORING 07/17/2009  . HYPERLIPIDEMIA-MIXED 07/02/2009  . HYPERTENSION, UNSPECIFIED 07/02/2009  .  CORONARY ATHEROSCLEROSIS OF ARTERY BYPASS GRAFT 07/02/2009  . CARDIOMYOPATHY, ISCHEMIC 07/02/2009    Past Surgical History  Procedure Laterality Date  . Back surgery      Multiple, with spinal stenosis  . Cataract extraction    . Coronary artery bypass graft  1999    In [place] with LIMA-LAD and SVG-OM1  . Total knee arthroplasty  5/10    Current Outpatient Rx  Name  Route  Sig  Dispense  Refill  . aspirin 81 MG EC tablet   Oral   Take 81 mg by mouth daily.           . chlorthalidone (HYGROTON) 25 MG tablet   Oral   Take 12.5 mg by mouth daily.           . cyanocobalamin (,VITAMIN B-12,) 1000 MCG/ML injection   Intramuscular   Inject 1,000 mcg into the muscle daily.           Marland Kitchen glipiZIDE (GLUCOTROL) 5 MG tablet   Oral   Take 5 mg by mouth 2 (two) times daily.           . Homeopathic Products ([employer] LEG CRAMPS PAIN RELIEF PO)   Oral   Take 1 tablet by mouth daily as needed.           . isosorbide mononitrate (IMDUR) 30 MG 24 hr tablet   Oral   Take 30 mg by mouth daily.           Marland Kitchen lisinopril (PRINIVIL,ZESTRIL) 40 MG tablet  Oral   Take 40 mg by mouth daily.           Marland Kitchen. lovastatin (MEVACOR) 20 MG tablet   Oral   Take 20 mg by mouth at bedtime.           . metFORMIN (GLUCOPHAGE) 1000 MG tablet   Oral   Take 1,000 mg by mouth 2 (two) times daily.           . metoprolol (TOPROL-XL) 50 MG 24 hr tablet   Oral   Take 50 mg by mouth daily.             Allergies Iodine and Ivp dye  Family History  Problem Relation Age of Onset  . Kidney failure Mother   . Heart failure Father     Social History Social History  Substance Use Topics  . Smoking status: Former Games developermoker  . Smokeless tobacco: None  . Alcohol Use: No    Review of Systems  Constitutional: Negative for fever. Positive for chills Eyes: Negative for visual changes. ENT: Negative for sore throat Cardiovascular: Positive for chest tightness Respiratory: Negative for  shortness of breath. Positive for cough Gastrointestinal: Negative for abdominal pain, vomiting and diarrhea. Genitourinary: Negative for dysuria. Musculoskeletal: Negative for back pain. Skin: Negative for rash. Neurological: Negative for headaches  Psychiatric: No anxiety    ____________________________________________   PHYSICAL EXAM:  VITAL SIGNS: ED Triage Vitals  Enc Vitals Group     BP 08/24/15 1038 124/55 mmHg     Pulse Rate 08/24/15 1038 91     Resp 08/24/15 1130 24     Temp 08/24/15 1038 97.5 F (36.4 C)     Temp Source 08/24/15 1038 Oral     SpO2 08/24/15 1038 93 %     Weight 08/24/15 1038 185 lb (83.915 kg)     Height 08/24/15 1038 5\' 6"  (1.676 m)     Head Cir --      Peak Flow --      Pain Score 08/24/15 1041 5     Pain Loc --      Pain Edu? --      Excl. in GC? --      Constitutional: Alert and oriented. Well appearing and in no distress. Eyes: Conjunctivae are normal.  ENT   Head: Normocephalic and atraumatic.   Mouth/Throat: Mucous membranes are moist. Cardiovascular: Normal rate, regular rhythm. Normal and symmetric distal pulses are present in all extremities. No murmurs Respiratory: Normal respiratory effort without tachypnea nor retractions. Breath sounds are clear and equal bilaterally.  Gastrointestinal: Soft and non-tender in all quadrants. No distention. There is no CVA tenderness. Genitourinary: deferred Musculoskeletal: Nontender with normal range of motion in all extremities. No lower extremity tenderness nor edema. Neurologic:  Normal speech and language. No gross focal neurologic deficits are appreciated. Skin:  Skin is warm, dry and intact. No rash noted. Psychiatric: Mood and affect are normal. Patient exhibits appropriate insight and judgment.  ____________________________________________    LABS (pertinent positives/negatives)  Labs Reviewed  CBC - Abnormal; Notable for the following:    RBC 3.89 (*)    Hemoglobin 12.1  (*)    HCT 36.5 (*)    All other components within normal limits  COMPREHENSIVE METABOLIC PANEL - Abnormal; Notable for the following:    Glucose, Bld 177 (*)    BUN 36 (*)    Creatinine, Ser 2.25 (*)    ALT 10 (*)    GFR calc non Af Amer 25 (*)  GFR calc Af Amer 29 (*)    All other components within normal limits  TROPONIN I - Abnormal; Notable for the following:    Troponin I 0.18 (*)    All other components within normal limits  URINALYSIS COMPLETEWITH MICROSCOPIC ([facility] ONLY) - Abnormal; Notable for the following:    Color, Urine YELLOW (*)    APPearance CLEAR (*)    Glucose, UA 50 (*)    Hgb urine dipstick 3+ (*)    Protein, ur 100 (*)    Bacteria, UA RARE (*)    Squamous Epithelial / LPF 0-5 (*)    All other components within normal limits    ____________________________________________   EKG  ED ECG REPORT I, Jene Every, the attending physician, personally viewed and interpreted this ECG.   Date: 08/24/2015  EKG Time: 10:45 AM  Rate: 92  Rhythm: normal sinus rhythm, 1st degree AV block  Axis: Normal axis  Intervals:none  ST&T Change: Nonspecific changes   ____________________________________________    RADIOLOGY I have personally reviewed any xrays that were ordered on this patient: Chest x-ray no acute distress  ____________________________________________   PROCEDURES  Procedure(s) performed: none  Critical Care performed: yes  CRITICAL CARE Performed by: Jene Every   Total critical care time: 30 minutes  Critical care time was exclusive of separately billable procedures and treating other patients.  Critical care was necessary to treat or prevent imminent or life-threatening deterioration.  Critical care was time spent personally by me on the following activities: development of treatment plan with patient and/or surrogate as well as nursing, discussions with consultants, evaluation of patient's response to treatment, examination  of patient, obtaining history from patient or surrogate, ordering and performing treatments and interventions, ordering and review of laboratory studies, ordering and review of radiographic studies, pulse oximetry and re-evaluation of patient's condition.   ____________________________________________   INITIAL IMPRESSION / ASSESSMENT AND PLAN / ED COURSE  Pertinent labs & imaging results that were available during my care of the patient were reviewed by me and considered in my medical decision making (see chart for details).  Patient presents with chest tightness, cough and apparently chills. Certainly there is concern for pneumonia although he also has a coronary history. We will check labs, chest x-ray and reevaluate.  Patient with elevated troponin of 0.18. I'll start heparin and admit him to the hospitalist service for concern for possible  NSTEMI  ____________________________________________   FINAL CLINICAL IMPRESSION(S) / ED DIAGNOSES  Final diagnoses:  NSTEMI (non-ST elevated myocardial infarction) (HCC)     Jene Every, MD 08/24/15 1325

## 2015-08-24 NOTE — ED Notes (Signed)
Pt to triage with 1 day hx of vomiting, fever, chills, cough and chest tightness. Also has vertego x 3 weeks and being treated by Dr. Jenne CampusMcQueen. Still dizzy.

## 2015-08-24 NOTE — ED Notes (Signed)
Per patient wife, yesterday patient had fever and chills, per wife for about 6 hours he "lost control of his body", patient could not walk or stand. Patient states that he has been experiencing vertigo like symptoms for the past 3 weeks, he has been seen twice for this not has not gotten any relief.

## 2015-08-24 NOTE — H&P (Signed)
[facility] [name] [facility]   PATIENT NAME: Kenneth [name]    MR#:  [id number]  DATE OF BIRTH:  06-04-1928  DATE OF ADMISSION:  08/24/2015  PRIMARY CARE PHYSICIAN: Barbette ReichmannHANDE,VISHWANATH, MD   REQUESTING/REFERRING PHYSICIAN: Dr Cyril LoosenKinner  CHIEF COMPLAINT:   Chest pain today HISTORY OF PRESENT ILLNESS:  Kenneth CalixHollan Wagar  is a [age] y.o. male with a known history of diabetes, hypertension, coronary disease status post CABG in the past comes to the emergency room with complaints of chest pain nonradiating with some nausea. Denies any diaphoresis or shortness of breath. Patient has been having some cough and congestion. Some chest tightness came to the emergency room was found to have a troponin of 0.18.  -Currently he is chest pain-free. He is being admitted for acute non-ST MI. He is being started on heparin drip given his chest pain and strong history of coronary artery disease in the past.  PAST MEDICAL HISTORY:   Past Medical History  Diagnosis Date  . Diabetes mellitus   . Hyperlipidemia   . Hypertension   . Coronary artery disease     Lexiscan myoview (5/10) showed EF 45%, hypokinetic apex and lateral wall, fixed perfusion defect involving apex, inferoapex, and inferolateral wall, also some inferoapical ischemia; LHC (5/10) showed patent LIMA-LAD and totally occluded SVG-OM1, 99% pLAD, 40% ostial CFX, 40% mCFX, 30% OM1, 20% pRCA  . History of MRI of chest 11/10    Cardiac; EF 48%, mid anterolateral and inferolateral walls, apical lateral wall, apical septal wall, and true apex were all think and akinetic; 51-75% thickness subendocardial delayed enhancement in mid anterolateral and inferolateral walls, full thickness enhancement in apical laterla and apical septal walls and true apex  . Nephrolithiasis   . Spinal stenosis   . B12 deficiency   . Obesity   . OSA (obstructive sleep apnea)     Moderate    PAST SURGICAL HISTOIRY:   Past Surgical History   Procedure Laterality Date  . Back surgery      Multiple, with spinal stenosis  . Cataract extraction    . Coronary artery bypass graft  1999    In [place] with LIMA-LAD and SVG-OM1  . Total knee arthroplasty  5/10    SOCIAL HISTORY:   Social History  Substance Use Topics  . Smoking status: Former Games developermoker  . Smokeless tobacco: Not on file  . Alcohol Use: No    FAMILY HISTORY:   Family History  Problem Relation Age of Onset  . Kidney failure Mother   . Heart failure Father     DRUG ALLERGIES:   Allergies  Allergen Reactions  . Beef-Derived Products Hives  . Iodine Hives  . Ivp Dye [Iodinated Diagnostic Agents] Hives    REVIEW OF SYSTEMS:  Review of Systems  Constitutional: Negative for fever, chills and weight loss.  HENT: Positive for congestion. Negative for ear discharge, ear pain and nosebleeds.   Eyes: Negative for blurred vision, pain and discharge.  Respiratory: Negative for sputum production, shortness of breath, wheezing and stridor.   Cardiovascular: Positive for chest pain. Negative for palpitations, orthopnea and PND.  Gastrointestinal: Negative for nausea, vomiting, abdominal pain and diarrhea.  Genitourinary: Negative for urgency and frequency.  Musculoskeletal: Negative for back pain and joint pain.  Neurological: Positive for dizziness. Negative for sensory change, speech change, focal weakness and weakness.  Psychiatric/Behavioral: Negative for depression and hallucinations. The patient is not nervous/anxious.   All other systems reviewed and are negative.  MEDICATIONS AT HOME:   Prior to Admission medications   Medication Sig Start Date End Date Taking? Authorizing Provider  aspirin 81 MG EC tablet Take 81 mg by mouth daily.     Yes Historical Provider, MD  glipiZIDE (GLUCOTROL) 5 MG tablet Take 5 mg by mouth daily.    Yes Historical Provider, MD  lisinopril (PRINIVIL,ZESTRIL) 20 MG tablet Take 20 mg by mouth daily.   Yes Historical  Provider, MD  lovastatin (MEVACOR) 20 MG tablet Take 20 mg by mouth at bedtime.     Yes Historical Provider, MD  metFORMIN (GLUCOPHAGE) 1000 MG tablet Take 1,000 mg by mouth 2 (two) times daily.     Yes Historical Provider, MD  metoprolol (TOPROL-XL) 50 MG 24 hr tablet Take 50 mg by mouth daily.     Yes Historical Provider, MD      VITAL SIGNS:  Blood pressure 134/62, pulse 88, temperature 97.5 F (36.4 C), temperature source Oral, resp. rate 27, height 5\' 6"  (1.676 m), weight 83.915 kg (185 lb), SpO2 96 %.  PHYSICAL EXAMINATION:  GENERAL:  82 y.o.-year-old patient lying in the bed with no acute distress. Obese EYES: Pupils equal, round, reactive to light and accommodation. No scleral icterus. Extraocular muscles intact.  HEENT: Head atraumatic, normocephalic. Oropharynx and nasopharynx clear.  NECK:  Supple, no jugular venous distention. No thyroid enlargement, no tenderness.  LUNGS: Normal breath sounds bilaterally, no wheezing, rales,rhonchi or crepitation. No use of accessory muscles of respiration.  CARDIOVASCULAR: S1, S2 normal. No murmurs, rubs, or gallops.  ABDOMEN: Soft, nontender, nondistended. Bowel sounds present. No organomegaly or mass.  EXTREMITIES: No pedal edema, cyanosis, or clubbing.  NEUROLOGIC: Cranial nerves II through XII are intact. Muscle strength 5/5 in all extremities. Sensation intact. Gait not checked.  PSYCHIATRIC: The patient is alert and oriented x 3.  SKIN: No obvious rash, lesion, or ulcer.   LABORATORY PANEL:   CBC  Recent Labs Lab 08/24/15 1144  WBC 10.4  HGB 12.1*  HCT 36.5*  PLT 296   ------------------------------------------------------------------------------------------------------------------  Chemistries   Recent Labs Lab 08/24/15 1144  NA 137  K 4.6  CL 104  CO2 26  GLUCOSE 177*  BUN 36*  CREATININE 2.25*  CALCIUM 9.0  AST 23  ALT 10*  ALKPHOS 62  BILITOT 0.7    ------------------------------------------------------------------------------------------------------------------  Cardiac Enzymes  Recent Labs Lab 08/24/15 1144  TROPONINI 0.18*   ------------------------------------------------------------------------------------------------------------------  RADIOLOGY:  Dg Chest Portable 1 View  08/24/2015  CLINICAL DATA:  82 year old male with cough, fever and chills for 1 day. EXAM: PORTABLE CHEST 1 VIEW COMPARISON:  None. FINDINGS: Cardiomegaly and CABG changes identified. There is no evidence of focal airspace disease, pulmonary edema, suspicious pulmonary nodule/mass, pleural effusion, or pneumothorax. No acute bony abnormalities are identified. IMPRESSION: Cardiomegaly without acute cardiopulmonary disease. Electronically Signed   By: Harmon Pier M.D.   On: 08/24/2015 12:25    EKG:   NSR. No acute ST elevation or depression IMPRESSION AND PLAN:  Kord Monette  is a 82 y.o. male with a known history of diabetes, hypertension, coronary disease status post CABG in the past comes to the emergency room with complaints of chest pain nonradiating with some nausea.  1. Acute NST EMI with history of coronary artery disease status post CABG in the remote past. Admit to telemetry IV heparin drip Cardiology consultation Cycle cardiac enzymes 3 Continue aspirin, nitroglycerin, Beta blockers and Ace inhibitors  2. CKD-IV Avoid nephrotoxins Patient advised to follow up with nephrology as  outpatient  3. Hypertension continue lisinopril  4. Type 2 diabetes We'll continue glipizide. I'll hold off on metformin just in case patient needs cardiac catheter which is less likely given his chronic kidney disease. Once decision is made with resume his metformin  5. Sinus congestion with symptoms of dizziness and mild vertigo Consider when necessary meclizine  6. DVT prophylaxis already on IV heparin drip   D/w dr Mariah Milling  All the records are  reviewed and case discussed with ED provider. Management plans discussed with the patient, family and they are in agreement.  CODE STATUS: full  TOTAL TIME TAKING CARE OF THIS PATIENT:45minutes.    [name] M.D on 08/24/2015 at 2:33 PM  Between 7am to 6pm - Pager - [phone]  After 6pm go to [email or web address] - password [email or web address] [facility]  [facility]  Office  [phone]  CC: Primary care physician; Barbette Reichmann, MD

## 2015-08-24 NOTE — ED Notes (Signed)
Pt presents with nausea, vomiting, dizziness x 2 days. Pt has been treated for vertigo, and states it is not helping. Family concerned that he is not getting better after treatment for vertigo. Pt has hx of diabetes, htn, high cholesterol. NAD noted.

## 2015-08-24 NOTE — Consult Note (Addendum)
.   Cardiology Consultation Note  Patient ID: Kenneth [name] T Grimes, MRN: [id number], DOB/AGE: 11/30/1927 82 y.o. Admit date: 08/24/2015   Date of Consult: 08/24/2015 Primary Physician: Barbette ReichmannHANDE,VISHWANATH, MD Primary Cardiologist: Mclean,dalton  Chief Complaint: SOB, malaise, vomiting, weakness, dizzy Reason for Consult: elevated cardiac enz, known CAD  HPI: 82 y.o. male with  history of CAD s/p CABG, mild ischemic cardiomyopathy, COPD, long smoking hx, OSA, and HTN, who presents with symptoms of weakness, vomiting, malaise, vertigo , chest tightness .   Vertigo started approximately 3 weeks ago, seen by ENT, started on meclizine, has had vestibular therapy with no improvement.  In the past day or so with profound weakness, particularly in the legs, wife found him on the ground in the bedroom, felt very cold, cough. This morning with some chest tightness. 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 Walks only short distances, no regular exercise.  Lost to follow-up for approximately 5 years, has not seen cardiology. Denies any significant symptoms during that time, managed by primary care Previous stents to the ostial left main was not performed as he was not particularly symptomatic.  Past medical history   CABG in 1999 with LIMA-LAD and SVG-OM1. Symptoms prior to CABG were fatigue and exertional shortness of breath, no chest pain.    Lexiscan myoview   showed EF 45% with lateral hypokinesis and a mixture of ischemia and infarction in the inferolateral and inferoapical walls.   Left heart cath showed totally occluded SVG-OM1 with patent LIMA-LAD. There was 99% proximal LAD stenosis and 80% ostial left main stenosis. The circumflex system had only nonobstructive disease. The patient had his knee replacement in May 2010 with no complications.    Cardiac MRI showed EF 48% and  akinetic mid inferolateral and mid anterolateral walls as well as the apex. There was > 50% thickness subendocardial delayed enhancement in those walls. This likely corresponds to circumflex distribution.    Past Medical History  Diagnosis Date  . Diabetes mellitus   . Hyperlipidemia   . Hypertension   . Coronary artery disease     Lexiscan myoview (5/10) showed EF 45%, hypokinetic apex and lateral wall, fixed perfusion defect involving apex, inferoapex, and inferolateral wall, also some inferoapical ischemia; LHC (5/10) showed patent LIMA-LAD and totally occluded SVG-OM1, 99% pLAD, 40% ostial CFX, 40% mCFX, 30% OM1, 20% pRCA  . History of MRI of chest 11/10    Cardiac; EF 48%, mid anterolateral and inferolateral walls, apical lateral wall, apical septal wall, and true apex were all think and akinetic; 51-75% thickness subendocardial delayed enhancement in mid anterolateral and inferolateral walls, full thickness enhancement in apical laterla and apical septal walls and true apex  . Nephrolithiasis   . Spinal stenosis   . B12 deficiency   . Obesity   . OSA (obstructive sleep apnea)     Moderate      Most Recent Cardiac Studies: Echo pending   Surgical History:  Past Surgical History  Procedure Laterality Date  . Back surgery      Multiple, with spinal stenosis  . Cataract extraction    . Coronary artery bypass graft  1999    In [place] with LIMA-LAD and SVG-OM1  . Total knee arthroplasty  5/10     Home Meds: Prior to Admission medications   Medication Sig Start Date End Date Taking? Authorizing Provider  aspirin 81 MG EC tablet  Take 81 mg by mouth daily.     Yes Historical Provider, MD  glipiZIDE (GLUCOTROL) 5 MG tablet Take 5 mg by mouth daily.    Yes Historical Provider, MD  lisinopril (PRINIVIL,ZESTRIL) 20 MG tablet Take 20 mg by mouth daily.   Yes Historical Provider, MD  lovastatin (MEVACOR) 20 MG tablet Take 20 mg by mouth at bedtime.     Yes Historical Provider,  MD  metFORMIN (GLUCOPHAGE) 1000 MG tablet Take 1,000 mg by mouth 2 (two) times daily.     Yes Historical Provider, MD  metoprolol (TOPROL-XL) 50 MG 24 hr tablet Take 50 mg by mouth daily.     Yes Historical Provider, MD    Inpatient Medications:    . heparin 12 Units/kg/hr (08/24/15 1356)    Allergies:  Allergies  Allergen Reactions  . Beef-Derived Products Hives  . Iodine Hives  . Ivp Dye [Iodinated Diagnostic Agents] Hives    Social History   Social History  . Marital Status: Married    Spouse Name: N/A  . Number of Children: 1  . Years of Education: N/A   Occupational History  . Retired    Social History Main Topics  . Smoking status: Former Games developer  . Smokeless tobacco: Not on file  . Alcohol Use: No  . Drug Use: Not on file  . Sexual Activity: Not on file   Other Topics Concern  . Not on file   Social History Narrative   Lives in [place].   Has 1 daughter in [place] and 3 grandchildren.   Daughter has a country music band.     Family History  Problem Relation Age of Onset  . Kidney failure Mother   . Heart failure Father      Review of Systems: Review of Systems  Constitutional: Positive for malaise/fatigue.  Respiratory: Positive for shortness of breath.   Cardiovascular: Negative.   Gastrointestinal: Positive for vomiting.  Musculoskeletal: Negative.   Neurological: Positive for dizziness and weakness.  Psychiatric/Behavioral: Negative.   All other systems reviewed and are negative.  Labs  Recent Labs  08/24/15 1144  TROPONINI 0.18*   Lab Results  Component Value Date   WBC 10.4 08/24/2015   HGB 12.1* 08/24/2015   HCT 36.5* 08/24/2015   MCV 93.8 08/24/2015   PLT 296 08/24/2015    Recent Labs Lab 08/24/15 1144  NA 137  K 4.6  CL 104  CO2 26  BUN 36*  CREATININE 2.25*  CALCIUM 9.0  PROT 7.5  BILITOT 0.7  ALKPHOS 62  ALT 10*  AST 23  GLUCOSE 177*   Lab Results  Component Value Date   CHOL 113 08/24/2015   HDL 52  08/24/2015   LDLCALC 45 08/24/2015   TRIG 81 08/24/2015   No results found for: DDIMER  Radiology/Studies:  Dg Chest Portable 1 View  08/24/2015  CLINICAL DATA:  82 year old male with cough, fever and chills for 1 day. EXAM: PORTABLE CHEST 1 VIEW COMPARISON:  None. FINDINGS: Cardiomegaly and CABG changes identified. There is no evidence of focal airspace disease, pulmonary edema, suspicious pulmonary nodule/mass, pleural effusion, or pneumothorax. No acute bony abnormalities are identified. IMPRESSION: Cardiomegaly without acute cardiopulmonary disease. Electronically Signed   By: Harmon Pier M.D.   On: 08/24/2015 12:25    EKG: Normal sinus rhythm with nonspecific ST and T-wave abnormality  Weights: Filed Weights   08/24/15 1038  Weight: 185 lb (83.915 kg)     Physical Exam:tele with nsr Blood pressure 137/60, pulse  97, temperature 97.5 F (36.4 C), temperature source Oral, resp. rate 27, height 5\' 6"  (1.676 m), weight 185 lb (83.915 kg), SpO2 91 %. Body mass index is 29.87 kg/(m^2). General: Well developed, well nourished, in no acute distress. Head: Normocephalic, atraumatic, sclera non-icteric, no xanthomas, nares are without discharge.  Neck: Negative for carotid bruits. JVD not elevated. Lungs: Clear bilaterally to auscultation without wheezes, rales, or rhonchi. Breathing is unlabored. Heart: RRR with S1 S2. No murmurs, rubs, or gallops appreciated. Abdomen: Soft, non-tender, non-distended with normoactive bowel sounds. No hepatomegaly. No rebound/guarding. No obvious abdominal masses. Msk:  Strength and tone appear normal for age. Extremities: No clubbing or cyanosis. No edema.  Distal pedal pulses are 2+ and equal bilaterally. Neuro: Alert and oriented X 3. No facial asymmetry. No focal deficit. Moves all extremities spontaneously. Psych:  Responds to questions appropriately with a normal affect.    Assessment and Plan:  1) chest pain/elevated cardiac enzymes Atypical in  nature, in the setting of recent cough, chills, weakness Vomiting yesterday, black material per the wife Suspect demand ischemia, wife reports he fell down on the ground, possibly from hypotension in the setting of illness Feeling somewhat better now Poor candidate for cardiac catheterization given severe kidney disease For now we'll continue to trend cardiac enzymes, Known severe coronary disease, Last seen more than 5 years ago with patent lima graft to the LAD, occluded vein graft to the OM, severe ostial left main disease, decision made to treat this medically --- Currently no plans for intervention If cardiac enzymes trend up significantly, may need to reconsider course of treatment --=--Could discontinue heparin once enzymes 3 are stable  2) vertigo Has been seen by ENT, symptoms over the past 3 weeks Taking meclizine at home  3) malaise, weakness, vomiting Suspect possible URI, Concerning for GI bleed? Has wife reported he vomited black emesis yesterday Continue to monitor closely as he is on heparin  4) cough, shortness of breath Unable to exclude chronic diastolic CHF. Echocardiogram pending to evaluate ejection fraction, right heart pressures  5)CAD, cabg Severe ostial left main disease in 2010, [facility] to [facility] for left main stenting. Scheduled follow-up in clinic every 4 months or so, has not been seen in 5 years. Denies any symptoms concerning for angina. We'll treat conservatively at this time Aspirin, statin, beta blocker  6) chronic renal insufficiency Previous creatinine of 3,  now more than 2 not a good  candidate for cardiac catheterization at this time  7) OSA: Will ask if he is on CPAP  8) COPD: Decreased lung sounds at baseline May benefit from inhalers  9) Long smoking hx 40+ years, stopped   Signed, Dossie Arbour, MD [facility] 08/24/2015, 3:41 PM

## 2015-08-24 NOTE — Progress Notes (Signed)
ANTICOAGULATION CONSULT NOTE - Initial Consult  Pharmacy Consult for Heparin Drip Indication: chest pain/ACS  Allergies  Allergen Reactions  . Beef-Derived Products Hives  . Iodine Hives  . Ivp Dye [Iodinated Diagnostic Agents] Hives    Patient Measurements: Height: 5\' 6"  (167.6 cm) Weight: 185 lb (83.915 kg) IBW/kg (Calculated) : 63.8 Heparin Dosing Weight: 81 kg  Vital Signs: Temp: 97.5 F (36.4 C) (01/02 1038) Temp Source: Oral (01/02 1038) BP: 134/62 mmHg (01/02 1200) Pulse Rate: 88 (01/02 1200)  Labs:  Recent Labs  08/24/15 1144  HGB 12.1*  HCT 36.5*  PLT 296  APTT 33  LABPROT 15.1*  INR 1.17  CREATININE 2.25*  TROPONINI 0.18*    Estimated Creatinine Clearance: 24.4 mL/min (by C-G formula based on Cr of 2.25).   Medical History: Past Medical History  Diagnosis Date  . Diabetes mellitus   . Hyperlipidemia   . Hypertension   . Coronary artery disease     Lexiscan myoview (5/10) showed EF 45%, hypokinetic apex and lateral wall, fixed perfusion defect involving apex, inferoapex, and inferolateral wall, also some inferoapical ischemia; LHC (5/10) showed patent LIMA-LAD and totally occluded SVG-OM1, 99% pLAD, 40% ostial CFX, 40% mCFX, 30% OM1, 20% pRCA  . History of MRI of chest 11/10    Cardiac; EF 48%, mid anterolateral and inferolateral walls, apical lateral wall, apical septal wall, and true apex were all think and akinetic; 51-75% thickness subendocardial delayed enhancement in mid anterolateral and inferolateral walls, full thickness enhancement in apical laterla and apical septal walls and true apex  . Nephrolithiasis   . Spinal stenosis   . B12 deficiency   . Obesity   . OSA (obstructive sleep apnea)     Moderate    Medications:  Scheduled:   Infusions:  . heparin 12 Units/kg/hr (08/24/15 1356)    Assessment: Pharmacy consulted to dose and titrate heparin drip in an 82 yo male with acute NSTEMI.    Est CrCl~24 mL/min,   Baseline labs:  INR: 1.17, Hgb: 12.1, Plts: 296, aPTT: 33  Goal of Therapy:  Heparin level 0.3-0.7 units/ml Monitor platelets by anticoagulation protocol: Yes   Plan:  Give 4000 units bolus x 1 Start heparin infusion at 1000 units/hr Check anti-Xa level in 8 hours and daily while on heparin Continue to monitor H&H and platelets  Clarisa Schoolsrystal Necie Wilcoxson, PharmD Clinical Pharmacist 08/24/2015

## 2015-08-24 NOTE — Progress Notes (Signed)
*  PRELIMINARY RESULTS* Echocardiogram 2D Echocardiogram has been performed.  Kenneth [name] 08/24/2015, 7:14 PM

## 2015-08-25 ENCOUNTER — Inpatient Hospital Stay: Payer: Medicare Other

## 2015-08-25 DIAGNOSIS — I248 Other forms of acute ischemic heart disease: Secondary | ICD-10-CM | POA: Insufficient documentation

## 2015-08-25 DIAGNOSIS — R7989 Other specified abnormal findings of blood chemistry: Secondary | ICD-10-CM

## 2015-08-25 DIAGNOSIS — I25810 Atherosclerosis of coronary artery bypass graft(s) without angina pectoris: Secondary | ICD-10-CM

## 2015-08-25 DIAGNOSIS — I209 Angina pectoris, unspecified: Secondary | ICD-10-CM | POA: Insufficient documentation

## 2015-08-25 LAB — BASIC METABOLIC PANEL
ANION GAP: 10 (ref 5–15)
BUN: 37 mg/dL — ABNORMAL HIGH (ref 6–20)
CHLORIDE: 105 mmol/L (ref 101–111)
CO2: 22 mmol/L (ref 22–32)
CREATININE: 2.14 mg/dL — AB (ref 0.61–1.24)
Calcium: 8.5 mg/dL — ABNORMAL LOW (ref 8.9–10.3)
GFR calc non Af Amer: 26 mL/min — ABNORMAL LOW (ref >60)
GFR, EST AFRICAN AMERICAN: 31 mL/min — AB (ref >60)
GLUCOSE: 203 mg/dL — AB (ref 65–99)
Potassium: 4.4 mmol/L (ref 3.5–5.1)
Sodium: 137 mmol/L (ref 135–145)

## 2015-08-25 LAB — CBC
HEMATOCRIT: 31.5 % — AB (ref 40.0–52.0)
Hemoglobin: 10.6 g/dL — ABNORMAL LOW (ref 13.0–18.0)
MCH: 31.1 pg (ref 26.0–34.0)
MCHC: 33.6 g/dL (ref 32.0–36.0)
MCV: 92.5 fL (ref 80.0–100.0)
PLATELETS: 253 10*3/uL (ref 150–440)
RBC: 3.4 MIL/uL — ABNORMAL LOW (ref 4.40–5.90)
RDW: 13.6 % (ref 11.5–14.5)
WBC: 8.1 10*3/uL (ref 3.8–10.6)

## 2015-08-25 LAB — GLUCOSE, CAPILLARY
GLUCOSE-CAPILLARY: 185 mg/dL — AB (ref 65–99)
Glucose-Capillary: 165 mg/dL — ABNORMAL HIGH (ref 65–99)
Glucose-Capillary: 143 mg/dL — ABNORMAL HIGH (ref 65–99)
Glucose-Capillary: 216 mg/dL — ABNORMAL HIGH (ref 65–99)

## 2015-08-25 LAB — TROPONIN I
TROPONIN I: 0.17 ng/mL — AB (ref <0.031)
Troponin I: 0.15 ng/mL — ABNORMAL HIGH (ref <0.031)

## 2015-08-25 LAB — HEMOGLOBIN A1C: Hgb A1c MFr Bld: 7.6 % — ABNORMAL HIGH (ref 4.0–6.0)

## 2015-08-25 MED ORDER — IPRATROPIUM-ALBUTEROL 0.5-2.5 (3) MG/3ML IN SOLN
3.0000 mL | RESPIRATORY_TRACT | Status: DC
  Administered 2015-08-25 – 2015-08-26 (×7): 3 mL via RESPIRATORY_TRACT
  Filled 2015-08-25 (×9): qty 3

## 2015-08-25 MED ORDER — MECLIZINE HCL 25 MG PO TABS: 25.0000 mg | ORAL_TABLET | Freq: Three times a day (TID) | ORAL | Status: DC | PRN

## 2015-08-25 MED ORDER — DIAZEPAM 2 MG PO TABS
2.0000 mg | ORAL_TABLET | Freq: Three times a day (TID) | ORAL | Status: DC
  Administered 2015-08-25 (×2): 2 mg via ORAL
  Filled 2015-08-25 (×3): qty 1

## 2015-08-25 MED ORDER — HEPARIN SODIUM (PORCINE) 5000 UNIT/ML IJ SOLN
5000.0000 [IU] | Freq: Three times a day (TID) | INTRAMUSCULAR | Status: DC
  Administered 2015-08-25 – 2015-09-04 (×30): 5000 [IU] via SUBCUTANEOUS
  Filled 2015-08-25 (×30): qty 1

## 2015-08-25 NOTE — NC FL2 (Signed)
[place] MEDICAID [name] LEVEL OF CARE SCREENING TOOL     IDENTIFICATION  Patient Name: Kenneth [name] Birthdate: 1928/03/21 Sex: male Admission Date (Current Location): 08/24/2015  [place] Number:  Chiropodist and Address:  [facility], [address] [place], [place] [address]      Provider Number: [id number]  Attending Physician Name and Address:  Enid Baas, MD  Relative Name and Phone Number:       Current Level of Care: Hospital Recommended Level of Care: Skilled Nursing Facility Prior Approval Number:    Date Approved/Denied:   PASRR Number:  [id number])  Discharge Plan: SNF    Current Diagnoses: Patient Active Problem List   Diagnosis Date Noted  . Elevated troponin   . Coronary artery disease involving coronary bypass graft of native heart without angina pectoris   . Ischemic chest pain (HCC)   . Acute non-ST elevation myocardial infarction (NSTEMI) (HCC) 08/24/2015  . NSTEMI (non-ST elevated myocardial infarction) (HCC)   . Angina pectoris (HCC)   . SOB (shortness of breath)   . Chronic diastolic CHF (congestive heart failure) (HCC)   . Centrilobular emphysema (HCC)   . Coronary artery disease involving coronary bypass graft of native heart with angina pectoris with documented spasm (HCC)   . Hyperlipidemia   . SLEEP APNEA 08/19/2009  . DYSPNEA 07/17/2009  . SNORING 07/17/2009  . HYPERLIPIDEMIA-MIXED 07/02/2009  . HYPERTENSION, UNSPECIFIED 07/02/2009  . CORONARY ATHEROSCLEROSIS OF ARTERY BYPASS GRAFT 07/02/2009  . CARDIOMYOPATHY, ISCHEMIC 07/02/2009    Orientation RESPIRATION BLADDER Height & Weight    Self, Time, Situation, Place  O2 (2 Liters Oxygen ) Continent 5\' 6"  (167.6 cm) 178 lbs.  BEHAVIORAL SYMPTOMS/MOOD NEUROLOGICAL BOWEL NUTRITION STATUS   (none )  (none ) Continent Diet (Diet: Heart Healthy/ Carb Modified. )  AMBULATORY STATUS COMMUNICATION OF NEEDS Skin   Extensive Assist  Verbally Normal                       Personal Care Assistance Level of Assistance  Bathing, Feeding, Dressing Bathing Assistance: Limited assistance Feeding assistance: Independent Dressing Assistance: Limited assistance     Functional Limitations Info  Sight, Hearing, Speech Sight Info: Adequate Hearing Info: Adequate Speech Info: Adequate    SPECIAL CARE FACTORS FREQUENCY  PT (By licensed PT)     PT Frequency:  (5)              Contractures      Additional Factors Info  Code Status, Allergies Code Status Info:  (Full Code. ) Allergies Info:  (Beef-derived Products, Iodine, Ivp Dye Iodinated Diagnostic Agents)           Current Medications (08/25/2015):  This is the current hospital active medication list Current Facility-Administered Medications  Medication Dose Route Frequency Provider Last Rate Last Dose  . 0.9 %  sodium chloride infusion  250 mL Intravenous PRN Enedina Finner, MD      . acetaminophen (TYLENOL) tablet 650 mg  650 mg Oral Q6H PRN Enedina Finner, MD       Or  . acetaminophen (TYLENOL) suppository 650 mg  650 mg Rectal Q6H PRN Enedina Finner, MD      . aspirin EC tablet 81 mg  81 mg Oral Daily Enedina Finner, MD   81 mg at 08/25/15 0753  . glipiZIDE (GLUCOTROL) tablet 5 mg  5 mg Oral Daily Enedina Finner, MD   5 mg at 08/25/15 0754  . Influenza  vac split quadrivalent PF (FLUARIX) injection 0.5 mL  0.5 mL Intramuscular Tomorrow-1000 Enedina FinnerSona Patel, MD      . insulin aspart (novoLOG) injection 0-15 Units  0-15 Units Subcutaneous TID WC Enedina FinnerSona Patel, MD   3 Units at 08/25/15 1136  . ipratropium-albuterol (DUONEB) 0.5-2.5 (3) MG/3ML nebulizer solution 3 mL  3 mL Nebulization Q4H Enid Baasadhika Kalisetti, MD   3 mL at 08/25/15 1125  . lisinopril (PRINIVIL,ZESTRIL) tablet 20 mg  20 mg Oral Daily Enedina FinnerSona Patel, MD   20 mg at 08/25/15 0750  . metoprolol succinate (TOPROL-XL) 24 hr tablet 50 mg  50 mg Oral Daily Enedina FinnerSona Patel, MD   50 mg at 08/25/15 0754  . morphine 2 MG/ML injection 1 mg   1 mg Intravenous Q4H PRN Enedina FinnerSona Patel, MD      . pantoprazole (PROTONIX) EC tablet 40 mg  40 mg Oral Daily Enedina FinnerSona Patel, MD   40 mg at 08/25/15 0750  . pravastatin (PRAVACHOL) tablet 10 mg  10 mg Oral q1800 Enedina FinnerSona Patel, MD   10 mg at 08/24/15 1722  . senna-docusate (Senokot-S) tablet 1 tablet  1 tablet Oral QHS PRN Enedina FinnerSona Patel, MD      . sodium chloride 0.9 % injection 3 mL  3 mL Intravenous Q12H Enedina FinnerSona Patel, MD   3 mL at 08/24/15 2200  . sodium chloride 0.9 % injection 3 mL  3 mL Intravenous PRN Enedina FinnerSona Patel, MD         Discharge Medications: Please see discharge summary for a list of discharge medications.  Relevant Imaging Results:  Relevant Lab Results:   Additional Information  (SSN: [id number])  Haig ProphetMorgan, Bailey G, LCSW

## 2015-08-25 NOTE — Progress Notes (Signed)
Initial Nutrition Assessment    INTERVENTION:   Meals and Snacks: Cater to patient preferences Education: pt verbalizes understanding of heart healthy/carb modified diet, briefly discussed basics with pt and family; declines further education at this time   NUTRITION DIAGNOSIS:   Reassess on follow-up  GOAL:   Patient will meet greater than or equal to 90% of their needs  MONITOR:    (Energy Intake, Anthropometrics, Digestive System, Electrolyte/Renal Profile)  REASON FOR ASSESSMENT:   Consult  (poor dietary habits)  ASSESSMENT:    Pt admitted with acute NSTEMI  Past Medical History  Diagnosis Date  . Diabetes mellitus   . Hyperlipidemia   . Hypertension   . Coronary artery disease     Lexiscan myoview (5/10) showed EF 45%, hypokinetic apex and lateral wall, fixed perfusion defect involving apex, inferoapex, and inferolateral wall, also some inferoapical ischemia; LHC (5/10) showed patent LIMA-LAD and totally occluded SVG-OM1, 99% pLAD, 40% ostial CFX, 40% mCFX, 30% OM1, 20% pRCA  . History of MRI of chest 11/10    Cardiac; EF 48%, mid anterolateral and inferolateral walls, apical lateral wall, apical septal wall, and true apex were all think and akinetic; 51-75% thickness subendocardial delayed enhancement in mid anterolateral and inferolateral walls, full thickness enhancement in apical laterla and apical septal walls and true apex  . Nephrolithiasis   . Spinal stenosis   . B12 deficiency   . Obesity   . OSA (obstructive sleep apnea)     Moderate     Diet Order:  Diet heart healthy/carb modified Room service appropriate?: Yes; Fluid consistency:: Thin   Energy Intake: recorded po intake 25% at breakfast, ate some lunch today but [place] was too tough; Clinical research associatewriter called down for tilapia and coleslaw for pt   Food and Nutrition Related History: pt reports good appetite at home  Electrolyte and Renal Profile:  Recent Labs Lab 08/24/15 1144 08/25/15 0412  BUN 36*  37*  CREATININE 2.25* 2.14*  NA 137 137  K 4.6 4.4   Glucose Profile:   Recent Labs  08/24/15 2113 08/25/15 0723 08/25/15 1304  GLUCAP 162* 165* 143*   Meds: ss novolog, glucotrol  Height:   Ht Readings from Last 1 Encounters:  08/24/15 5\' 6"  (1.676 m)    Weight:   Wt Readings from Last 1 Encounters:  08/24/15 178 lb 8 oz (80.967 kg)    Filed Weights   08/24/15 1038 08/24/15 1547  Weight: 185 lb (83.915 kg) 178 lb 8 oz (80.967 kg)    BMI:  Body mass index is 28.82 kg/(m^2).  LOW Care Level  Romelle StarcherCate Twanna Resh MS, [place]RD, LDN [phone] Pager  [phone] Weekend/On-Call Pager

## 2015-08-25 NOTE — Evaluation (Signed)
Physical Therapy Evaluation Patient Details Name: Kenneth [name] MRN: [id number] DOB: 08-Mar-1928 Today's Date: 08/25/2015   History of Present Illness  presented to ER secondary to chest pain, nausea, LE weakness status post fall in home environment; admitted with possible NSTEMI (troponin peak 0.17), initially managed with heparin drip (now discontinued).  Of note, patient recently being treated for vertigo by ENT (x3 weeks) without improvement in symptoms.  Clinical Impression  Upon evaluation, patient alert and oriented to basic information; inconsistently follows simple commands (75%), but requires increased time for processing and task initiation.  Bilat UE/LE strength and ROM grossly symmetrical and at least 4/5; mild tremor L > R UE noted with finger to nose.  Denies sensory deficit.  Attempted gross visual screening, but patient with difficulty following tasks for accurate assessment.  Questionable deficit in saccades, but no significant resting nystagmus noted.  Currently requiring mod/max assist for all bed mobility; mod assist for unsupported sitting balance with very poor righting reactions noted.  Absent functional reach, as patient unable to maintain static sitting indep at this time.  As a result, unable to complete any upright mobility without extensive physical assist (mod assist) +2.  Patient very high fall risk and unsafe to return home with wife at this time due to risk of fall/injury. Question benefit of neuro consult at this time given marked deficits in sitting/standing balance and overall functional ability; RN informed/aware. Would benefit from skilled PT to address above deficits and promote optimal return to PLOF; recommend transition to STR upon discharge from acute hospitalization.     Follow Up Recommendations SNF    Equipment Recommendations       Recommendations for Other Services  (Neuro consult)     Precautions / Restrictions Precautions Precautions:  Fall Restrictions Weight Bearing Restrictions: No      Mobility  Bed Mobility Overal bed mobility: Needs Assistance Bed Mobility: Supine to Sit     Supine to sit: Mod assist;Max assist     General bed mobility comments: constant min/mod assist +1 for sitting balance once upright (due to posterior and R lateral trunk lean)  Transfers Overall transfer level: Needs assistance Equipment used: Rolling walker (2 wheeled) Transfers: Sit to/from Stand Sit to Stand: Mod assist            Ambulation/Gait Ambulation/Gait assistance: Mod assist;+2 physical assistance Ambulation Distance (Feet): 5 Feet Assistive device: Rolling walker (2 wheeled)       General Gait Details: very short, shuffling (near festinating-like) gait pattern.  Poor balance; very high fall risk.  Stairs            Wheelchair Mobility    Modified Rankin (Stroke Patients Only)       Balance Overall balance assessment: Needs assistance Sitting-balance support: No upper extremity supported;Feet supported Sitting balance-Leahy Scale: Poor Sitting balance - Comments: consistent R lateral and posterior trunk lean with absent spontaneous righting reactions; constant min/mod assist from therapist for correction   Standing balance support: Bilateral upper extremity supported Standing balance-Leahy Scale: Poor Standing balance comment: mod assist +2; however, decreased R lateral lean in standing vs. sitting                             Pertinent Vitals/Pain Pain Assessment: No/denies pain    Home Living Family/patient expects to be discharged to:: Private residence Living Arrangements: Spouse/significant other Available Help at Discharge: Family Type of Home: House Home Access: Level entry  Home Layout: One level        Prior Function Level of Independence: Independent with assistive device(s)         Comments: Intermittent use of SPC at times; patient/wife deny recent  fall history (outside of this event)     Hand Dominance        Extremity/Trunk Assessment   Upper Extremity Assessment: Overall WFL for tasks assessed (grossly at least 4/5 bilat.  Mild tremor with open chain activities.  Denies sensory deficit)           Lower Extremity Assessment: Overall WFL for tasks assessed (grossly at least 4/5 bilat.  Denies sensory deficit.  No clonus or babinski noted.)         Communication   Communication: HOH  Cognition Arousal/Alertness: Awake/alert Behavior During Therapy: WFL for tasks assessed/performed Overall Cognitive Status:  (inconsistent ability to follow simple commands (75%), worsened with fatigue; delayed processing)                      General Comments      Exercises Other Exercises Other Exercises: Unsuportted sitting edge of bed, participated with L UE dynamic reaching to promote L anterior/lateral weight shift, min/mod assist throughout      Assessment/Plan    PT Assessment Patient needs continued PT services  PT Diagnosis Difficulty walking;Abnormality of gait;Generalized weakness   PT Problem List Decreased strength;Decreased range of motion;Decreased activity tolerance;Decreased balance;Decreased mobility;Decreased coordination;Decreased cognition;Decreased knowledge of use of DME;Decreased safety awareness;Decreased knowledge of precautions  PT Treatment Interventions DME instruction;Gait training;Stair training;Functional mobility training;Therapeutic activities;Therapeutic exercise;Balance training;Neuromuscular re-education;Patient/family education;Cognitive remediation   PT Goals (Current goals can be found in the Care Plan section) Acute Rehab PT Goals Patient Stated Goal: patient unable to verbalize; per wife, to consider rehab prior to return home PT Goal Formulation: With patient/family Time For Goal Achievement: 09/08/15 Potential to Achieve Goals: Good    Frequency Min 2X/week   Barriers to  discharge Decreased caregiver support      Co-evaluation               End of Session Equipment Utilized During Treatment: Gait belt Activity Tolerance: Patient tolerated treatment well Patient left: in chair;with call bell/phone within reach;with chair alarm set;with family/visitor present Nurse Communication: Mobility status         Time: 1349-1425 PT Time Calculation (min) (ACUTE ONLY): 36 min   Charges:   PT Evaluation $PT Eval Moderate Complexity: 1 Procedure PT Treatments $Neuromuscular Re-education: 8-22 mins   PT G Codes:        Kristen H. Manson PasseyBrown, PT, DPT, NCS 08/25/2015, 4:58 PM [phone]

## 2015-08-25 NOTE — Progress Notes (Signed)
Patient: Kenneth [name] / Admit Date: 08/24/2015 / Date of Encounter: 08/25/2015, 10:00 AM   Subjective: Feels better, did not sleep well, Family reports that he is not walking well, May need PT (home?) Weakness and malaise is recent, possible infection  Review of Systems: Review of Systems  Respiratory: Negative.   Cardiovascular: Negative.   Gastrointestinal: Negative.   Musculoskeletal:       Leg weakness  Neurological: Positive for weakness.  Psychiatric/Behavioral: Negative.   All other systems reviewed and are negative.   Objective: Telemetry: tele with NSR Physical Exam: Blood pressure 136/65, pulse 108, temperature 97.9 F (36.6 C), temperature source Oral, resp. rate 18, height 5\' 6"  (1.676 m), weight 178 lb 8 oz (80.967 kg), SpO2 98 %. Body mass index is 28.82 kg/(m^2). General: Well developed, well nourished, in no acute distress. Head: Normocephalic, atraumatic, sclera non-icteric, no xanthomas, nares are without discharge. Neck: Negative for carotid bruits. JVP not elevated. Lungs: Clear bilaterally to auscultation without wheezes, rales, or rhonchi. Breathing is unlabored. Heart: RRR S1 S2 without murmurs, rubs, or gallops.  Abdomen: Soft, non-tender, non-distended with normoactive bowel sounds. No rebound/guarding. Extremities: No clubbing or cyanosis. No edema. Distal pedal pulses are 2+ and equal bilaterally. Neuro: Alert and oriented X 3. Moves all extremities spontaneously. Psych:  Responds to questions appropriately with a normal affect.   Intake/Output Summary (Last 24 hours) at 08/25/15 1000 Last data filed at 08/25/15 0958  Gross per 24 hour  Intake 170.17 ml  Output   1225 ml  Net -1054.83 ml    Inpatient Medications:  . aspirin EC  81 mg Oral Daily  . glipiZIDE  5 mg Oral Daily  . Influenza vac split quadrivalent PF  0.5 mL Intramuscular Tomorrow-1000  . insulin aspart  0-15 Units Subcutaneous TID WC  . ipratropium-albuterol  3 mL  Nebulization Q4H  . lisinopril  20 mg Oral Daily  . metoprolol succinate  50 mg Oral Daily  . pantoprazole  40 mg Oral Daily  . pravastatin  10 mg Oral q1800  . sodium chloride  3 mL Intravenous Q12H   Infusions:    Labs:  Recent Labs  08/24/15 1144  NA 137  K 4.6  CL 104  CO2 26  GLUCOSE 177*  BUN 36*  CREATININE 2.25*  CALCIUM 9.0    Recent Labs  08/24/15 1144  AST 23  ALT 10*  ALKPHOS 62  BILITOT 0.7  PROT 7.5  ALBUMIN 3.7    Recent Labs  08/24/15 1144 08/25/15 0412  WBC 10.4 8.1  HGB 12.1* 10.6*  HCT 36.5* 31.5*  MCV 93.8 92.5  PLT 296 253    Recent Labs  08/24/15 1144 08/24/15 1725 08/24/15 2242 08/25/15 0412  TROPONINI 0.18* 0.17* 0.17* 0.15*   Invalid input(s): POCBNP  Recent Labs  08/24/15 1725  HGBA1C 7.6*     Weights: Filed Weights   08/24/15 1038 08/24/15 1547  Weight: 185 lb (83.915 kg) 178 lb 8 oz (80.967 kg)     Radiology/Studies:  Dg Chest Portable 1 View  08/24/2015  CLINICAL DATA:  82 year old male with cough, fever and chills for 1 day. EXAM: PORTABLE CHEST 1 VIEW COMPARISON:  None. FINDINGS: Cardiomegaly and CABG changes identified. There is no evidence of focal airspace disease, pulmonary edema, suspicious pulmonary nodule/mass, pleural effusion, or pneumothorax. No acute bony abnormalities are identified. IMPRESSION: Cardiomegaly without acute cardiopulmonary disease. Electronically Signed   By: Harmon PierJeffrey  Hu M.D.   On: 08/24/2015 12:25  Assessment and Plan  82 y.o. male  1) chest pain/elevated cardiac enzymes Suspect demand ischemiain the setting of recent cough, chills, weakness, Vomiting  Feeling better  Known severe coronary disease, Last seen more than 5 years ago with patent lima graft to the LAD, occluded vein graft to the OM, severe ostial left main disease, decision made to treat this medically --no plans for inpt stress or cath Follow up as an outpt  2) vertigo Has been seen by ENT, symptoms over the  past 3 weeks Taking meclizine at home  3) malaise, weakness, vomiting Suspect possible URI, Concerning for GI bleed? Has wife reported he vomited black emesis yesterday Continue to monitor closely as he is on heparin  4) cough, shortness of breath Unable to exclude chronic diastolic CHF. Echocardiogram with normal EF and normal RVSP May need lasix 20 mg to take only PRN for SOB  5)CAD, cabg Severe ostial left main disease in 2010, Sent to [facility] for evaluation for left main stenting. Scheduled follow-up in clinic every 4 months or so, has not been seen in 5 years. Denies any symptoms concerning for angina. We'll treat conservatively at this time Aspirin, statin, beta blocker  6) chronic renal insufficiency Previous creatinine of 3,  now more than 2 not a good candidate for cardiac catheterization at this time  7) OSA:  on CPAP  8) COPD: Decreased lung sounds at baseline May benefit from inhalers  9) Long smoking hx 40+ years, stopped   Signed, Dossie Arbour, MD [facility] 08/25/2015, 10:00 AM

## 2015-08-25 NOTE — Clinical Social Work Note (Signed)
Clinical Social Work Assessment  Patient Details  Name: Kenneth [name] MRN: [id number] Date of Birth: [birth year]  Date of referral:  08/25/15               Reason for consult:  Facility Placement                Permission sought to share information with:  Chartered certified accountant granted to share information::  Yes, Verbal Permission Granted  Name::      Kenn[name]::   [name]   Relationship::     Contact Information:     Housing/Transportation Living arrangements for the past 2 months:  [name] of Information:  Patient, Spouse Patient Interpreter Needed:  None Criminal Activity/Legal Involvement Pertinent to Current Situation/Hospitalization:  No - Comment as needed Significant Relationships:  Adult Children, Spouse Lives with:  Spouse, Pets Do you feel safe going back to the place where you live?  Yes Need for family participation in patient care:  Yes (Comment)  Care giving concerns: Patient lives in [name] with his wife [name].    Social Worker assessment / plan: [name] received verbal consult from PT that recommendation is SNF. [name] met with patient and his wife Kenneth [name] was at bedside. Patient was alert and oriented and sitting up in the bed. [name] introduced self and explained role of [name] department. Patient reported that he lives in [name] with his wife [name] and his dog. Wife reported that patient has a C-pap machine of his own that he wears at night and does not have oxygen at home. Patient's nephew Kenneth [name] walked in room during assessment. Wife and patient reported that we could proceed with assessment with nephew present. [name] explained that PT is recommending SNF. Wife and patient are agreeable to SNF search and prefer [facility] [name] or [name].   [name] complete and faxed out.   Employment status:  Retired Nurse, adult PT Recommendations:  [name] / Referral to community resources:  [name]  Patient/Family's Response to care: Patient and wife are agreeable to [name].   Patient/Family's Understanding of and Emotional Response to Diagnosis, Current Treatment, and Prognosis:  Patient and wife were pleasant throughout assessment.   Emotional Assessment Appearance:  Appears stated age Attitude/Demeanor/Rapport:    Affect (typically observed):  Accepting, Adaptable, Pleasant Orientation:  Oriented to Self, Oriented to Place, Oriented to Situation, Oriented to  Time Alcohol / Substance use:  Not Applicable Psych involvement (Current and /or in the community):  No (Comment)  Discharge Needs  Concerns to be addressed:  Discharge Planning Concerns Readmission within the last 30 days:  No Current discharge risk:  Dependent with Mobility Barriers to Discharge:  Continued Medical Work up   Kenn[place] [name] 08/25/2015, 3:48 PM

## 2015-08-25 NOTE — Clinical Social Work Placement (Signed)
   CLINICAL SOCIAL WORK PLACEMENT  NOTE  Date:  08/25/2015  Patient Details  Name: Kenneth [name] MRN: [id number] Date of Birth: 12-Jan-1928  Clinical Social Work is seeking post-discharge placement for this patient at the Skilled  Nursing Facility level of care (*[name] will initial, date and re-position this form in  chart as items are completed):  Yes   Patient/family provided with [place] Clinical Social Work Department's list of facilities offering this level of care within the geographic area requested by the patient (or if unable, by the patient's family).  Yes   Patient/family informed of their freedom to choose among providers that offer the needed level of care, that participate in Medicare, Medicaid or managed care program needed by the patient, have an available bed and are willing to accept the patient.  Yes   Patient/family informed of [facility] ownership interest [facility] and [facility], as well as of the fact that they are under no obligation to receive care at these facilities.  PASRR submitted to EDS on 08/25/15     PASRR number received on 08/25/15     Existing PASRR number confirmed on       [name] transmitted to all facilities in geographic area requested by pt/family on 08/25/15     [name] transmitted to all facilities within larger geographic area on       Patient informed that his/her managed care company has contracts with or will negotiate with certain facilities, including the following:            Patient/family informed of bed offers received.  Patient chooses bed at       Physician recommends and patient chooses bed at      Patient to be transferred to   on  .  Patient to be transferred to facility by       Patient family notified on   of transfer.  Name of family member notified:        PHYSICIAN       Additional Comment:    _______________________________________________ Haig ProphetMorgan, Dorothey Oetken G, LCSW 08/25/2015, 3:47 PM

## 2015-08-25 NOTE — Progress Notes (Signed)
[facility] Physicians - [place] at [facility]   PATIENT NAME: Kenneth [name]    MR#:  [id number]  DATE OF BIRTH:  April 09, 1928  SUBJECTIVE:  CHIEF COMPLAINT:   Chief Complaint  Patient presents with  . Chest Pain   -Patient with recent worsening of his dizziness. Also noted to have chest pain and weakness generalized yesterday. -Troponins have been stabilized. Off heparin drip today.  REVIEW OF SYSTEMS:  Review of Systems  Constitutional: Negative for fever and chills.  HENT: Negative for ear pain, nosebleeds and tinnitus.   Eyes: Negative for blurred vision.  Respiratory: Negative for cough, shortness of breath and wheezing.   Cardiovascular: Negative for chest pain and palpitations.  Gastrointestinal: Negative for nausea, vomiting, abdominal pain, diarrhea and constipation.  Genitourinary: Negative for dysuria, urgency and frequency.  Musculoskeletal: Negative for myalgias, back pain and neck pain.  Neurological: Positive for dizziness and weakness. Negative for tingling, tremors, sensory change, focal weakness, seizures and headaches.    DRUG ALLERGIES:   Allergies  Allergen Reactions  . Beef-Derived Products Hives  . Iodine Hives  . Ivp Dye [Iodinated Diagnostic Agents] Hives    VITALS:  Blood pressure 142/72, pulse 75, temperature 97.8 F (36.6 C), temperature source Oral, resp. rate 19, height 5\' 6"  (1.676 m), weight 80.967 kg (178 lb 8 oz), SpO2 95 %.  PHYSICAL EXAMINATION:  Physical Exam  GENERAL:  82 y.o.-year-old patient lying in the bed with no acute distress.  EYES: Pupils equal, round, reactive to light and accommodation. No scleral icterus. Extraocular muscles intact.  HEENT: Head atraumatic, normocephalic. Oropharynx and nasopharynx clear.  NECK:  Supple, no jugular venous distention. No thyroid enlargement, no tenderness.  LUNGS: Normal breath sounds bilaterally, no wheezing, rales,rhonchi or crepitation. No use of accessory muscles of  respiration. Degrees Y basilar breath sounds. CARDIOVASCULAR: S1, S2 normal. No rubs, or gallops. 3/6 systolic murmur is present ABDOMEN: Soft, nontender, nondistended. Bowel sounds present. No organomegaly or mass.  EXTREMITIES: No pedal edema, cyanosis, or clubbing.  NEUROLOGIC: Cranial nerves II through XII are intact. Muscle strength 5/5 in all extremities. Sensation intact. Gait not checked.  PSYCHIATRIC: The patient is alert and oriented x 3.  SKIN: No obvious rash, lesion, or ulcer.    LABORATORY PANEL:   CBC  Recent Labs Lab 08/25/15 0412  WBC 8.1  HGB 10.6*  HCT 31.5*  PLT 253   ------------------------------------------------------------------------------------------------------------------  Chemistries   Recent Labs Lab 08/24/15 1144 08/25/15 0412  NA 137 137  K 4.6 4.4  CL 104 105  CO2 26 22  GLUCOSE 177* 203*  BUN 36* 37*  CREATININE 2.25* 2.14*  CALCIUM 9.0 8.5*  AST 23  --   ALT 10*  --   ALKPHOS 62  --   BILITOT 0.7  --    ------------------------------------------------------------------------------------------------------------------  Cardiac Enzymes  Recent Labs Lab 08/25/15 0412  TROPONINI 0.15*   ------------------------------------------------------------------------------------------------------------------  RADIOLOGY:  Mr Maxine Glenn Head Wo Contrast  08/25/2015  CLINICAL DATA:  Ataxia EXAM: MRA HEAD WITHOUT CONTRAST TECHNIQUE: Angiographic images of the Circle of Willis were obtained using MRA technique without intravenous contrast. COMPARISON:  MR head 08/25/2015 FINDINGS: Mild to moderate stenosis distal left vertebral artery. Right vertebral artery widely patent. Basilar widely patent. Superior cerebellar and posterior cerebral arteries patent bilaterally. Internal carotid artery widely patent bilaterally without significant stenosis. Hypoplastic right A1 segment. Both anterior cerebral arteries are patent and supplied from the left. Both  middle cerebral arteries patent without significant stenosis. Negative for aneurysm  or vascular malformation. IMPRESSION: Mild to moderate stenosis distal left vertebral artery. Otherwise no significant intracranial stenosis. Electronically Signed   By: Marlan Palau M.D.   On: 08/25/2015 13:58   Mr Angiogram Neck Wo Contrast  08/25/2015  CLINICAL DATA:  Ataxia EXAM: MRA NECK WITHOUT CONTRAST TECHNIQUE: Angiographic images of the neck were obtained using MRA technique without intravenous contrast. Carotid stenosis measurements (when applicable) are obtained utilizing NASCET criteria, using the distal internal carotid diameter as the denominator. COMPARISON:  MR head 08/25/2015 FINDINGS: MRA NECK FINDINGS Image quality degraded by mild motion and lack of intravenous contrast. Carotid bifurcation widely patent bilaterally. Tortuosity of the cervical internal carotid artery bilaterally. Negative for stenosis or dissection Both vertebral arteries patent to the basilar. Mild stenosis distal left vertebral artery. IMPRESSION: No significant carotid stenosis. Mild stenosis distal left vertebral artery. Electronically Signed   By: Marlan Palau M.D.   On: 08/25/2015 13:15   Mr Brain Wo Contrast  08/25/2015  CLINICAL DATA:  Ataxia EXAM: MRI HEAD WITHOUT CONTRAST TECHNIQUE: Multiplanar, multiecho pulse sequences of the brain and surrounding structures were obtained without intravenous contrast. COMPARISON:  CT 08/02/2011 FINDINGS: Negative for acute infarct Moderate atrophy. Moderate chronic microvascular ischemic change in the cerebral white matter. Mild chronic ischemia in the basal ganglia and pons. Chronic infarct high left frontal lobe. Negative for intracranial hemorrhage.  Negative for fluid collection Negative for mass or edema.  No shift of the midline structures. Paranasal sinuses clear.  Negative orbit.  Pituitary not enlarged. IMPRESSION: Atrophy and chronic ischemia.  No acute abnormality. Electronically  Signed   By: Marlan Palau M.D.   On: 08/25/2015 12:56   Dg Chest Portable 1 View  08/24/2015  CLINICAL DATA:  82 year old male with cough, fever and chills for 1 day. EXAM: PORTABLE CHEST 1 VIEW COMPARISON:  None. FINDINGS: Cardiomegaly and CABG changes identified. There is no evidence of focal airspace disease, pulmonary edema, suspicious pulmonary nodule/mass, pleural effusion, or pneumothorax. No acute bony abnormalities are identified. IMPRESSION: Cardiomegaly without acute cardiopulmonary disease. Electronically Signed   By: Harmon Pier M.D.   On: 08/24/2015 12:25    EKG:   Orders placed or performed during the hospital encounter of 08/24/15  . ED EKG  . ED EKG    ASSESSMENT AND PLAN:   Kenneth [name] is a 83 y.o. male with a known history of diabetes, hypertension, coronary disease status post CABG in the past comes to the emergency room with complaints of chest pain nonradiating with some nausea.  1. Elevated troponin with history of coronary artery disease status post CABG in the remote past. Likely demand ischemia. Troponins are stable. -Discontinue IV heparin drip. Appreciate Cardiology consultation Continue cardiac medications.  2. Dizziness-subacute. Seen by ENT as an outpatient. Started on meclizine with minimal help. -We'll check orthostatic hypotension changes. -Physical therapy consult. MRI and MRA of the brain to rule out any intracranial pathology. -Started meclizine when necessary. Also started on Valium 3 times a day  3. CKD-IV Avoid nephrotoxins Patient advised to follow up with nephrology as outpatient  4. Hypertension continue lisinopril  5. Type 2 diabetes We'll continue glipizide. -Hold metformin given his CKD. On SSI  6. DVT prophylaxis-heparin drip discontinued. Start DVT prophylaxis with subcutaneous heparin    All the records are reviewed and case discussed with Care Management/Social Workerr. Management plans discussed with the patient, family  and they are in agreement.  CODE STATUS: Full code  TOTAL TIME TAKING CARE OF THIS PATIENT: 37  minutes.   POSSIBLE D/C IN 1-2 DAYS, DEPENDING ON CLINICAL CONDITION.   Enid BaasKALISETTI,Wyatt Galvan M.D on 08/25/2015 at 4:41 PM  Between 7am to 6pm - Pager - [phone]  After 6pm go to [email or web address] - password [email or web address] [facility]  [facility]  Office  [phone]  CC: Primary care physician; Barbette ReichmannHANDE,VISHWANATH, MD

## 2015-08-25 NOTE — Progress Notes (Signed)
ANTICOAGULATION CONSULT NOTE - Initial Consult  Pharmacy Consult for Heparin Drip Indication: chest pain/ACS  Allergies  Allergen Reactions  . Beef-Derived Products Hives  . Iodine Hives  . Ivp Dye [Iodinated Diagnostic Agents] Hives    Patient Measurements: Height: 5\' 6"  (167.6 cm) Weight: 178 lb 8 oz (80.967 kg) IBW/kg (Calculated) : 63.8 Heparin Dosing Weight: 81 kg  Vital Signs: Temp: 98.3 F (36.8 C) (01/02 2112) Temp Source: Oral (01/02 2112) BP: 150/62 mmHg (01/02 2112) Pulse Rate: 110 (01/02 2147)  Labs:  Recent Labs  08/24/15 1144 08/24/15 1725 08/24/15 2242  HGB 12.1*  --   --   HCT 36.5*  --   --   PLT 296  --   --   APTT 33  --   --   LABPROT 15.1*  --   --   INR 1.17  --   --   HEPARINUNFRC  --   --  0.19*  CREATININE 2.25*  --   --   TROPONINI 0.18* 0.17*  --     Estimated Creatinine Clearance: 24 mL/min (by C-G formula based on Cr of 2.25).   Medical History: Past Medical History  Diagnosis Date  . Diabetes mellitus   . Hyperlipidemia   . Hypertension   . Coronary artery disease     Lexiscan myoview (5/10) showed EF 45%, hypokinetic apex and lateral wall, fixed perfusion defect involving apex, inferoapex, and inferolateral wall, also some inferoapical ischemia; LHC (5/10) showed patent LIMA-LAD and totally occluded SVG-OM1, 99% pLAD, 40% ostial CFX, 40% mCFX, 30% OM1, 20% pRCA  . History of MRI of chest 11/10    Cardiac; EF 48%, mid anterolateral and inferolateral walls, apical lateral wall, apical septal wall, and true apex were all think and akinetic; 51-75% thickness subendocardial delayed enhancement in mid anterolateral and inferolateral walls, full thickness enhancement in apical laterla and apical septal walls and true apex  . Nephrolithiasis   . Spinal stenosis   . B12 deficiency   . Obesity   . OSA (obstructive sleep apnea)     Moderate    Medications:  Scheduled:  . aspirin EC  81 mg Oral Daily  . glipiZIDE  5 mg Oral Daily   . heparin  2,400 Units Intravenous Once  . Influenza vac split quadrivalent PF  0.5 mL Intramuscular Tomorrow-1000  . insulin aspart  0-15 Units Subcutaneous TID WC  . lisinopril  20 mg Oral Daily  . metoprolol succinate  50 mg Oral Daily  . pantoprazole  40 mg Oral Daily  . pravastatin  10 mg Oral q1800  . sodium chloride  3 mL Intravenous Q12H   Infusions:  . heparin      Assessment: Pharmacy consulted to dose and titrate heparin drip in an 82 yo male with acute NSTEMI.    Est CrCl~24 mL/min,   Baseline labs: INR: 1.17, Hgb: 12.1, Plts: 296, aPTT: 33  Goal of Therapy:  Heparin level 0.3-0.7 units/ml Monitor platelets by anticoagulation protocol: Yes   Plan:  Heparin level subtherapeutic, bolus 2400 units IV x 1 and increase rate to 1250 units/hr. Will recheck level in 8 hours.  Jalaila Caradonna A. Vanleerookson, VermontPharm.D., BCPS Clinical Pharmacist 08/25/2015

## 2015-08-25 NOTE — Progress Notes (Signed)
Pt in bed AAOX3 no distress noted. PT in room

## 2015-08-26 ENCOUNTER — Encounter: Payer: Self-pay | Admitting: Student

## 2015-08-26 ENCOUNTER — Ambulatory Visit: Payer: Medicare Other

## 2015-08-26 ENCOUNTER — Inpatient Hospital Stay: Payer: Medicare Other

## 2015-08-26 DIAGNOSIS — I248 Other forms of acute ischemic heart disease: Principal | ICD-10-CM

## 2015-08-26 DIAGNOSIS — N184 Chronic kidney disease, stage 4 (severe): Secondary | ICD-10-CM

## 2015-08-26 DIAGNOSIS — N189 Chronic kidney disease, unspecified: Secondary | ICD-10-CM

## 2015-08-26 DIAGNOSIS — N289 Disorder of kidney and ureter, unspecified: Secondary | ICD-10-CM

## 2015-08-26 DIAGNOSIS — D631 Anemia in chronic kidney disease: Secondary | ICD-10-CM

## 2015-08-26 DIAGNOSIS — H8112 Benign paroxysmal vertigo, left ear: Secondary | ICD-10-CM

## 2015-08-26 DIAGNOSIS — R000 Tachycardia, unspecified: Secondary | ICD-10-CM

## 2015-08-26 LAB — BASIC METABOLIC PANEL
ANION GAP: 9 (ref 5–15)
BUN: 43 mg/dL — ABNORMAL HIGH (ref 6–20)
CO2: 25 mmol/L (ref 22–32)
Calcium: 8.3 mg/dL — ABNORMAL LOW (ref 8.9–10.3)
Chloride: 102 mmol/L (ref 101–111)
Creatinine, Ser: 2.7 mg/dL — ABNORMAL HIGH (ref 0.61–1.24)
GFR calc Af Amer: 23 mL/min — ABNORMAL LOW (ref >60)
GFR calc non Af Amer: 20 mL/min — ABNORMAL LOW (ref >60)
GLUCOSE: 227 mg/dL — AB (ref 65–99)
POTASSIUM: 4.4 mmol/L (ref 3.5–5.1)
Sodium: 136 mmol/L (ref 135–145)

## 2015-08-26 LAB — GLUCOSE, CAPILLARY
GLUCOSE-CAPILLARY: 228 mg/dL — AB (ref 65–99)
GLUCOSE-CAPILLARY: 187 mg/dL — AB (ref 65–99)
GLUCOSE-CAPILLARY: 197 mg/dL — AB (ref 65–99)
Glucose-Capillary: 262 mg/dL — ABNORMAL HIGH (ref 65–99)

## 2015-08-26 LAB — FIBRIN DERIVATIVES D-DIMER (ARMC ONLY): Fibrin derivatives D-dimer (ARMC): 1918.85 — ABNORMAL HIGH (ref 0–499)

## 2015-08-26 LAB — CBC
HCT: 31.3 % — ABNORMAL LOW (ref 40.0–52.0)
HEMOGLOBIN: 10.4 g/dL — AB (ref 13.0–18.0)
MCH: 30.8 pg (ref 26.0–34.0)
MCHC: 33.3 g/dL (ref 32.0–36.0)
MCV: 92.6 fL (ref 80.0–100.0)
PLATELETS: 271 10*3/uL (ref 150–440)
RBC: 3.38 MIL/uL — AB (ref 4.40–5.90)
RDW: 13.5 % (ref 11.5–14.5)
WBC: 5.3 10*3/uL (ref 3.8–10.6)

## 2015-08-26 LAB — SEDIMENTATION RATE

## 2015-08-26 LAB — TSH: TSH: 1.19 u[IU]/mL (ref 0.350–4.500)

## 2015-08-26 LAB — VITAMIN B12: Vitamin B-12: 327 pg/mL (ref 180–914)

## 2015-08-26 MED ORDER — INSULIN GLARGINE 100 UNIT/ML ~~LOC~~ SOLN
10.0000 [IU] | Freq: Every day | SUBCUTANEOUS | Status: DC
  Administered 2015-08-26 – 2015-09-01 (×7): 10 [IU] via SUBCUTANEOUS
  Filled 2015-08-26 (×8): qty 0.1

## 2015-08-26 MED ORDER — POLYETHYLENE GLYCOL 3350 17 G PO PACK
17.0000 g | PACK | Freq: Every day | ORAL | Status: DC | PRN
  Filled 2015-08-26: qty 1

## 2015-08-26 MED ORDER — SENNOSIDES-DOCUSATE SODIUM 8.6-50 MG PO TABS
2.0000 | ORAL_TABLET | Freq: Every day | ORAL | Status: DC
  Administered 2015-08-26 – 2015-09-04 (×9): 2 via ORAL
  Filled 2015-08-26 (×10): qty 2

## 2015-08-26 MED ORDER — TECHNETIUM TC 99M DIETHYLENETRIAME-PENTAACETIC ACID
31.9200 | Freq: Once | INTRAVENOUS | Status: AC | PRN
  Administered 2015-08-26: 31.92 via INTRAVENOUS

## 2015-08-26 MED ORDER — INSULIN GLARGINE 100 UNIT/ML ~~LOC~~ SOLN
12.0000 [IU] | Freq: Every day | SUBCUTANEOUS | Status: DC
  Filled 2015-08-26: qty 0.12

## 2015-08-26 MED ORDER — SODIUM CHLORIDE 0.9 % IV SOLN
INTRAVENOUS | Status: DC
  Administered 2015-08-26 – 2015-08-29 (×5): via INTRAVENOUS

## 2015-08-26 MED ORDER — VANCOMYCIN HCL IN DEXTROSE 1-5 GM/200ML-% IV SOLN
1000.0000 mg | INTRAVENOUS | Status: DC
  Administered 2015-08-27: 1000 mg via INTRAVENOUS
  Filled 2015-08-26: qty 200

## 2015-08-26 MED ORDER — ASPIRIN EC 81 MG PO TBEC: 81.0000 mg | DELAYED_RELEASE_TABLET | Freq: Every day | ORAL | Status: DC

## 2015-08-26 MED ORDER — TECHNETIUM TO 99M ALBUMIN AGGREGATED
4.3400 | Freq: Once | INTRAVENOUS | Status: AC | PRN
  Administered 2015-08-26: 4.34 via INTRAVENOUS

## 2015-08-26 MED ORDER — PIPERACILLIN-TAZOBACTAM 3.375 G IVPB
3.3750 g | Freq: Three times a day (TID) | INTRAVENOUS | Status: DC
  Administered 2015-08-26 – 2015-08-27 (×3): 3.375 g via INTRAVENOUS
  Filled 2015-08-26 (×5): qty 50

## 2015-08-26 MED ORDER — MECLIZINE HCL 25 MG PO TABS
12.5000 mg | ORAL_TABLET | Freq: Three times a day (TID) | ORAL | Status: DC
  Administered 2015-08-26 – 2015-09-04 (×27): 12.5 mg via ORAL
  Filled 2015-08-26 (×28): qty 1

## 2015-08-26 MED ORDER — VANCOMYCIN HCL IN DEXTROSE 1-5 GM/200ML-% IV SOLN
1000.0000 mg | Freq: Once | INTRAVENOUS | Status: AC
Start: 2015-08-26 — End: 2015-08-26
  Administered 2015-08-26: 1000 mg via INTRAVENOUS
  Filled 2015-08-26: qty 200

## 2015-08-26 MED ORDER — SODIUM CHLORIDE 0.9 % IV BOLUS (SEPSIS)
250.0000 mL | Freq: Once | INTRAVENOUS | Status: AC
  Administered 2015-08-26: 250 mL via INTRAVENOUS

## 2015-08-26 MED ORDER — ASPIRIN EC 81 MG PO TBEC
81.0000 mg | DELAYED_RELEASE_TABLET | Freq: Every day | ORAL | Status: DC
  Administered 2015-08-26 – 2015-09-04 (×9): 81 mg via ORAL
  Filled 2015-08-26 (×9): qty 1

## 2015-08-26 MED ORDER — IPRATROPIUM-ALBUTEROL 0.5-2.5 (3) MG/3ML IN SOLN
3.0000 mL | RESPIRATORY_TRACT | Status: DC | PRN
  Administered 2015-08-28 – 2015-08-31 (×3): 3 mL via RESPIRATORY_TRACT
  Filled 2015-08-26 (×3): qty 3

## 2015-08-26 NOTE — Progress Notes (Signed)
Clinical Child psychotherapistocial Worker ([name]) presented bed offers to patient, who was alone at bedside. Patient reported that he would discuss offers [facility]. [name] left bed offers in room. [name] will check back after wife has arrived to get SNF choice.  Jetta LoutBailey Morgan, LCSWA [phone]

## 2015-08-26 NOTE — Progress Notes (Signed)
[facility] Physicians - [place] at [facility]   PATIENT NAME: Kenneth [name]    MR#:  [id number]  DATE OF BIRTH:  1928/07/22  SUBJECTIVE:  CHIEF COMPLAINT:   Chief Complaint  Patient presents with  . Chest Pain   - dizziness and weakness persists. MRI of the brain and MRA without any acute findings - appears tachypneic today, remains on 2l o2, no chest pain - HR elevated -Heparin drip discontinued last evening. -Wife at bedside  REVIEW OF SYSTEMS:  Review of Systems  Constitutional: Negative for fever and chills.  HENT: Negative for ear pain, nosebleeds and tinnitus.   Eyes: Negative for blurred vision.  Respiratory: Positive for cough and shortness of breath. Negative for wheezing.   Cardiovascular: Negative for chest pain and palpitations.  Gastrointestinal: Negative for nausea, vomiting, abdominal pain, diarrhea and constipation.  Genitourinary: Negative for dysuria, urgency and frequency.  Musculoskeletal: Negative for myalgias, back pain and neck pain.  Neurological: Positive for dizziness and weakness. Negative for tingling, tremors, sensory change, focal weakness, seizures and headaches.    DRUG ALLERGIES:   Allergies  Allergen Reactions  . Beef-Derived Products Hives  . Iodine Hives  . Ivp Dye [Iodinated Diagnostic Agents] Hives    VITALS:  Blood pressure 141/61, pulse 117, temperature 98.8 F (37.1 C), temperature source Oral, resp. rate 18, height 5\' 6"  (1.676 m), weight 80.967 kg (178 lb 8 oz), SpO2 94 %.  PHYSICAL EXAMINATION:  Physical Exam  GENERAL:  82 y.o.-year-old patient lying in the bed and appears to be in mild respiratory distress.  EYES: Pupils equal, round, reactive to light and accommodation. No scleral icterus. Extraocular muscles intact.  HEENT: Head atraumatic, normocephalic. Oropharynx and nasopharynx clear.  NECK:  Supple, no jugular venous distention. No thyroid enlargement, no tenderness.  LUNGS: Normal breath sounds  bilaterally, no wheezing, rales,rhonchi or crepitation. Minimal use of accessory muscles on exertion. Decreased basilar breath sounds. Scattered rhonchi heard at the bases posteriorly CARDIOVASCULAR: S1, S2 normal. No rubs, or gallops. 3/6 systolic murmur is present ABDOMEN: Soft, nontender, nondistended. Bowel sounds present. No organomegaly or mass.  EXTREMITIES: No pedal edema, cyanosis, or clubbing.  NEUROLOGIC: Cranial nerves II through XII are intact. Muscle strength 4/5 in all extremities. Her lites weakness is present Sensation intact. Gait not checked.  PSYCHIATRIC: The patient is alert and oriented x 3. Slow to respond and also process information SKIN: No obvious rash, lesion, or ulcer.    LABORATORY PANEL:   CBC  Recent Labs Lab 08/26/15 0908  WBC 5.3  HGB 10.4*  HCT 31.3*  PLT 271   ------------------------------------------------------------------------------------------------------------------  Chemistries   Recent Labs Lab 08/24/15 1144  08/26/15 0438  NA 137  < > 136  K 4.6  < > 4.4  CL 104  < > 102  CO2 26  < > 25  GLUCOSE 177*  < > 227*  BUN 36*  < > 43*  CREATININE 2.25*  < > 2.70*  CALCIUM 9.0  < > 8.3*  AST 23  --   --   ALT 10*  --   --   ALKPHOS 62  --   --   BILITOT 0.7  --   --   < > = values in this interval not displayed. ------------------------------------------------------------------------------------------------------------------  Cardiac Enzymes  Recent Labs Lab 08/25/15 0412  TROPONINI 0.15*   ------------------------------------------------------------------------------------------------------------------  RADIOLOGY:  Mr Shirlee Latch Wo Contrast  08/25/2015  CLINICAL DATA:  Ataxia EXAM: MRA HEAD WITHOUT CONTRAST TECHNIQUE: Angiographic  images of the Circle of Willis were obtained using MRA technique without intravenous contrast. COMPARISON:  MR head 08/25/2015 FINDINGS: Mild to moderate stenosis distal left vertebral artery. Right  vertebral artery widely patent. Basilar widely patent. Superior cerebellar and posterior cerebral arteries patent bilaterally. Internal carotid artery widely patent bilaterally without significant stenosis. Hypoplastic right A1 segment. Both anterior cerebral arteries are patent and supplied from the left. Both middle cerebral arteries patent without significant stenosis. Negative for aneurysm or vascular malformation. IMPRESSION: Mild to moderate stenosis distal left vertebral artery. Otherwise no significant intracranial stenosis. Electronically Signed   By: Marlan Palauharles  Clark M.D.   On: 08/25/2015 13:58   Mr Angiogram Neck Wo Contrast  08/25/2015  CLINICAL DATA:  Ataxia EXAM: MRA NECK WITHOUT CONTRAST TECHNIQUE: Angiographic images of the neck were obtained using MRA technique without intravenous contrast. Carotid stenosis measurements (when applicable) are obtained utilizing NASCET criteria, using the distal internal carotid diameter as the denominator. COMPARISON:  MR head 08/25/2015 FINDINGS: MRA NECK FINDINGS Image quality degraded by mild motion and lack of intravenous contrast. Carotid bifurcation widely patent bilaterally. Tortuosity of the cervical internal carotid artery bilaterally. Negative for stenosis or dissection Both vertebral arteries patent to the basilar. Mild stenosis distal left vertebral artery. IMPRESSION: No significant carotid stenosis. Mild stenosis distal left vertebral artery. Electronically Signed   By: Marlan Palauharles  Clark M.D.   On: 08/25/2015 13:15   Mr Brain Wo Contrast  08/25/2015  CLINICAL DATA:  Ataxia EXAM: MRI HEAD WITHOUT CONTRAST TECHNIQUE: Multiplanar, multiecho pulse sequences of the brain and surrounding structures were obtained without intravenous contrast. COMPARISON:  CT 08/02/2011 FINDINGS: Negative for acute infarct Moderate atrophy. Moderate chronic microvascular ischemic change in the cerebral white matter. Mild chronic ischemia in the basal ganglia and pons. Chronic  infarct high left frontal lobe. Negative for intracranial hemorrhage.  Negative for fluid collection Negative for mass or edema.  No shift of the midline structures. Paranasal sinuses clear.  Negative orbit.  Pituitary not enlarged. IMPRESSION: Atrophy and chronic ischemia.  No acute abnormality. Electronically Signed   By: Marlan Palauharles  Clark M.D.   On: 08/25/2015 12:56    EKG:   Orders placed or performed during the hospital encounter of 08/24/15  . ED EKG  . ED EKG    ASSESSMENT AND PLAN:   Arlington CalixHollan Freas is a 82 y.o. male with a known history of diabetes, hypertension, coronary disease status post CABG in the past comes to the emergency room with complaints of chest pain nonradiating with some nausea.  1. Elevated troponin with history of coronary artery disease status post CABG in the remote past. Likely demand ischemia. Troponins are stable. -Discontinue IV heparin drip. Appreciate Cardiology consultation Continue cardiac medications.  2. Altered mental status-likely metabolic encephalopathy. Discontinue Valium. -Fever this afternoon. Sent for blood cultures. Likely could be a respiratory source is also noted to have tachypnea and cough. -Check a chest x-ray. -Empirically on Vanco and Zosyn. -For his tachypnea, elevated d-dimer so cardiology ordered a VQ scan. However patient was just off of heparin drip since yesterday and also started on DVT prophylaxis.  3. Dizziness-subacute. Seen by ENT as an outpatient. Started on meclizine with minimal help. -Discontinue Valium due to mental status changes from it. -MRI of the brain and MRA of the head and neck without any acute findings -Physical therapy consult appreciated. Appreciate neurology consult. -exercises and therapy recommended. -Continue to monitor orthostatic blood pressure changes  4. CKD-IV: Baseline creatinine around 2.,  Avoid nephrotoxins-hold lisinopril -Gentle  hydration today Patient advised to follow up with  nephrology as outpatient  5. Hypertension- hold lisinopril for now given renal failure  6. Type 2 diabetes- sugars are elevated -continue glipizide. -Hold metformin given his CKD. On SSI -Added Lantus for tonight  7. DVT Prophylaxis- on subcutaneous heparin    All the records are reviewed and case discussed with Care Management/Social Workerr. Management plans discussed with the patient, family and they are in agreement.  CODE STATUS: Full code  TOTAL CRITICAL CARE TIME spent in TAKING CARE OF THIS PATIENT: 40 minutes.   POSSIBLE D/C IN 1-2 DAYS, DEPENDING ON CLINICAL CONDITION.   Enid Baas M.D on 08/26/2015 at 1:25 PM  Between 7am to 6pm - Pager - [phone]  After 6pm go to [email or web address] - password [email or web address] [facility]  [place] [facility]  Office  [phone]  CC: Primary care physician; Barbette Reichmann, MD

## 2015-08-26 NOTE — Care Management (Signed)
[facility]

## 2015-08-26 NOTE — Progress Notes (Signed)
PT Cancellation Note  Patient Details Name: Kenneth [name] MRN: [id number] DOB: November 20, 1927   Cancelled Treatment:    Reason Eval/Treat Not Completed: Medical issues which prohibited therapy (Patient noted with order for VQ scan to rule out PE; will hold at this time until results received and patient cleared for activity.)   Johan Creveling H. Manson PasseyBrown, PT, DPT, NCS 08/26/2015, 1:36 PM [phone]

## 2015-08-26 NOTE — Progress Notes (Signed)
Patient Name: Kenneth [name] Date of Encounter: 08/26/2015  Active Problems:   OSA on CPAP   Demand ischemia (HCC)   Coronary artery disease involving coronary bypass graft of native heart without angina pectoris   Ischemic chest pain (HCC)   CKD (chronic kidney disease) stage 4, GFR 15-29 ml/min (HCC)   Sinus tachycardia (HCC)    Primary Cardiologist: Dr. Shirlee Latch (Back in 2010) Patient Profile: 82 y.o. male w/ PMH of CAD (s/p CABG in 1999 w/ LIMA-LAD and SVG-OM1, SVG-OM1 known to be occluded), mild ischemic cardiomyopathy, COPD, OSA, and HTN, who presented with weakness, vomiting, malaise, vertigo and chest tightness on 08/24/2015.  SUBJECTIVE: Reports his breathing is worse (on 2L [place]). Denies any chest pain or palpitations. HR gradually increasing this admission, from 80-90's to 120's.   OBJECTIVE Filed Vitals:   08/26/15 0036 08/26/15 0350 08/26/15 0459 08/26/15 0843  BP:   135/78 134/78  Pulse:   111 122  Temp:   97.7 F (36.5 C)   TempSrc:   Oral   Resp:   20   Height:      Weight:      SpO2: 95% 96% 100% 95%    Intake/Output Summary (Last 24 hours) at 08/26/15 0911 Last data filed at 08/26/15 0616  Gross per 24 hour  Intake    360 ml  Output    900 ml  Net   -540 ml   Filed Weights   08/24/15 1038 08/24/15 1547  Weight: 185 lb (83.915 kg) 178 lb 8 oz (80.967 kg)    PHYSICAL EXAM General: Well developed, well nourished, male in no acute distress. On 2L . Head: Normocephalic, atraumatic.  Neck: Supple without bruits, JVD not elevated. Lungs:  Resp regular and unlabored, CTA without wheezing or rales. Heart: Regular rhythm, tachycardiac rate, S1, S2, no S3, S4, or murmur; no rub. Abdomen: Soft, non-tender, non-distended with normoactive bowel sounds. No hepatomegaly. No rebound/guarding. No obvious abdominal masses. Extremities: No clubbing, cyanosis, or edema. Distal pedal pulses are 2+ bilaterally. Neuro: Alert and oriented X 3. Moves all extremities  spontaneously. Psych: Normal affect.   LABS: CBC:  Recent Labs  08/24/15 1144 08/25/15 0412  WBC 10.4 8.1  HGB 12.1* 10.6*  HCT 36.5* 31.5*  MCV 93.8 92.5  PLT 296 253   INR:  Recent Labs  08/24/15 1144  INR 1.17   Basic Metabolic Panel:  Recent Labs  16/10/96 0412 08/26/15 0438  NA 137 136  K 4.4 4.4  CL 105 102  CO2 22 25  GLUCOSE 203* 227*  BUN 37* 43*  CREATININE 2.14* 2.70*  CALCIUM 8.5* 8.3*   Liver Function Tests:  Recent Labs  08/24/15 1144  AST 23  ALT 10*  ALKPHOS 62  BILITOT 0.7  PROT 7.5  ALBUMIN 3.7   Cardiac Enzymes:  Recent Labs  08/24/15 1725 08/24/15 2242 08/25/15 0412  TROPONINI 0.17* 0.17* 0.15*   Hemoglobin A1C:  Recent Labs  08/24/15 1725  HGBA1C 7.6*   Fasting Lipid Panel:  Recent Labs  08/24/15 1202  CHOL 113  HDL 52  LDLCALC 45  TRIG 81  CHOLHDL 2.2    TELE:   Sinus tachycardia with rate in 110's - 120's. 3 beats asymptomatic NSVT overnight.      ECHO: 08/24/2015 Study Conclusions - Left ventricle: The cavity size was normal. Systolic function was normal. The estimated ejection fraction was in the range of 55% to 60%. Regional wall motion abnormalities cannot be excluded.  Left ventricular diastolic function parameters were normal. - Left atrium: The atrium was mildly dilated. - Right ventricle: Systolic function was normal. - Pulmonary arteries: Systolic pressure was within the normal range. - Inferior vena cava: The vessel was normal in size. The respirophasic diameter changes were in the normal range (>= 50%), consistent with normal central venous pressure.  Impressions: - Challenging image quality.  Radiology/Studies: Mr Shirlee Latch OZ Contrast: 08/25/2015  CLINICAL DATA:  Ataxia EXAM: MRA HEAD WITHOUT CONTRAST TECHNIQUE: Angiographic images of the Circle of Willis were obtained using MRA technique without intravenous contrast. COMPARISON:  MR head 08/25/2015 FINDINGS: Mild to moderate  stenosis distal left vertebral artery. Right vertebral artery widely patent. Basilar widely patent. Superior cerebellar and posterior cerebral arteries patent bilaterally. Internal carotid artery widely patent bilaterally without significant stenosis. Hypoplastic right A1 segment. Both anterior cerebral arteries are patent and supplied from the left. Both middle cerebral arteries patent without significant stenosis. Negative for aneurysm or vascular malformation. IMPRESSION: Mild to moderate stenosis distal left vertebral artery. Otherwise no significant intracranial stenosis. Electronically Signed   By: Marlan Palau M.D.   On: 08/25/2015 13:58   Mr Angiogram Neck Wo Contrast: 08/25/2015  CLINICAL DATA:  Ataxia EXAM: MRA NECK WITHOUT CONTRAST TECHNIQUE: Angiographic images of the neck were obtained using MRA technique without intravenous contrast. Carotid stenosis measurements (when applicable) are obtained utilizing NASCET criteria, using the distal internal carotid diameter as the denominator. COMPARISON:  MR head 08/25/2015 FINDINGS: MRA NECK FINDINGS Image quality degraded by mild motion and lack of intravenous contrast. Carotid bifurcation widely patent bilaterally. Tortuosity of the cervical internal carotid artery bilaterally. Negative for stenosis or dissection Both vertebral arteries patent to the basilar. Mild stenosis distal left vertebral artery. IMPRESSION: No significant carotid stenosis. Mild stenosis distal left vertebral artery. Electronically Signed   By: Marlan Palau M.D.   On: 08/25/2015 13:15   Mr Brain Wo Contrast: 08/25/2015  CLINICAL DATA:  Ataxia EXAM: MRI HEAD WITHOUT CONTRAST TECHNIQUE: Multiplanar, multiecho pulse sequences of the brain and surrounding structures were obtained without intravenous contrast. COMPARISON:  CT 08/02/2011 FINDINGS: Negative for acute infarct Moderate atrophy. Moderate chronic microvascular ischemic change in the cerebral white matter. Mild chronic ischemia  in the basal ganglia and pons. Chronic infarct high left frontal lobe. Negative for intracranial hemorrhage.  Negative for fluid collection Negative for mass or edema.  No shift of the midline structures. Paranasal sinuses clear.  Negative orbit.  Pituitary not enlarged. IMPRESSION: Atrophy and chronic ischemia.  No acute abnormality. Electronically Signed   By: Marlan Palau M.D.   On: 08/25/2015 12:56   Dg Chest Portable 1 View: 08/24/2015  CLINICAL DATA:  82 year old male with cough, fever and chills for 1 day. EXAM: PORTABLE CHEST 1 VIEW COMPARISON:  None. FINDINGS: Cardiomegaly and CABG changes identified. There is no evidence of focal airspace disease, pulmonary edema, suspicious pulmonary nodule/mass, pleural effusion, or pneumothorax. No acute bony abnormalities are identified. IMPRESSION: Cardiomegaly without acute cardiopulmonary disease. Electronically Signed   By: Harmon Pier M.D.   On: 08/24/2015 12:25     Current Medications:  . aspirin EC  81 mg Oral Daily  . diazepam  2 mg Oral TID  . glipiZIDE  5 mg Oral Daily  . heparin subcutaneous  5,000 Units Subcutaneous 3 times per day  . Influenza vac split quadrivalent PF  0.5 mL Intramuscular Tomorrow-1000  . insulin aspart  0-15 Units Subcutaneous TID WC  . ipratropium-albuterol  3 mL Nebulization Q4H  .  lisinopril  20 mg Oral Daily  . metoprolol succinate  50 mg Oral Daily  . pantoprazole  40 mg Oral Daily  . pravastatin  10 mg Oral q1800  . sodium chloride  250 mL Intravenous Once  . sodium chloride  3 mL Intravenous Q12H      ASSESSMENT AND PLAN:  1. Atypical Chest pain/ Demand Ischemia - history of CAD (s/p CABG in 1999 w/ LIMA-LAD and SVG-OM1, SVG-OM1 known to be occluded by cath in 2010). Has not followed up in 5+ years. - Suspect demand ischemia in the setting of recent cough, chills, weakness, and vomiting. - peaked at 0.18. Trending down to 0.15. - no plans for inpatient NST or cath. Will follow-up as an outpatient -  continue statin and BB.Will restart PTA 81mg  ASA. Discontinue ACE-I in setting of CKD.  2. Vertigo - Has been seen by ENT, symptoms over the past 3 weeks - Taking meclizine at home - per IM  3. Malaise, Weakness, Vomiting, Cough - Suspect possible URI. Reports improvement in his symptoms. - per IM  4. Cough, shortness of breath - Per Dr. Mariah Milling, unable to exclude chronic diastolic CHF, echo with preserved EF. - Consider addition of Lasix 20 mg daily PRN for SOB  5. Stage 4 CKD - baseline 2.7 - 3.0 - creatinine of 2.70 on 08/25/2014, increased from 2.14 yesterday. - possibly dehydrated. Will administer NS bolus over 2 hours.  6. OSA - on CPAP  7. COPD/ History of tobacco abuse - 40+ pack year history - per IM  8. Sinus tachycardia  - has gradually increased from 80-90's to consistent 120's. - does report being anxious this morning (patient's nurse reports it has been worse since being started on Diazepam yesterday) - checking d-dimer with increased worsening shortness of breath. If elevated, will need CTA to rule out PE.  - checking CBC with worsening anemia (down on 10.6 on 08/25/2015)  Signed, Ellsworth Lennox , PA-C 9:11 AM 08/26/2015 Pager: [phone]   I have seen, examined and evaluated the patient this morning along with Randall An, PA-C.  After reviewing all the available data and chart,  I agree with her findings, examination as well as impression recommendations.  Very pleasant 82 year old gentleman with long-standing COPD. He is known occlusion of his vein graft to OM with left main disease. Doesn't patent LIMA to LAD but for the last 5 years and treated medically. Now presents with main concern of vertigo and dyspnea. He is felt to likely have the potential of diastolic heart failure Has his EF is preserved.  His blood pressure is relatively well-controlled, however his heart rate this morning is in the 120s. He does note a little bit more dyspneic  today than yesterday. He is also very anxious. At this point we need to exclude PE, infection, pain, anxiety as etiologies. He also could be dehydrated as he is not out 1 L and renal function is worsening. Could consider gentle rehydration with IV normal saline. We are checking a d-dimer to exclude PE. Must also consider anemia with hemoglobin dropping based on recent labs. Will check hemoglobin this morning as well.  Relatively flat troponin levels with worsening renal function would make me less concerned with possible non-STEMI. Probably most consistent with demand ischemia with baseline coronary disease. Would not have any plans for invasive or noninvasive evaluation at this point as he is relatively stable. With worsening renal function, Would not be an option at this time.  We'll  continue to follow. Defer to primary team, but consider holding ACE inhibitor with worsening renal function. He within needs something else for blood pressure.  Plan focus now is to find the underlying cause and treat. He is very anxious about vertigo which may be driving some tachycardia.   Marykay LexHARDING, [place] Branch W, M.D., M.S. Interventional Cardiologist   Pager # [phone]

## 2015-08-26 NOTE — Progress Notes (Addendum)
Clinical Child psychotherapistocial Worker ([name]) followed up with patient's wife Corrie DandyMary to get SNF choice. [facility]. [name] is aware of accepted bed offer. Per MD patient is not medically stable for D/C today. [name] is aware of above.   Jetta LoutBailey Morgan, LCSWA [phone]

## 2015-08-26 NOTE — Progress Notes (Signed)
Patient found to have axillary temp, warm and flushed. Dr. Mack HookKallisetti notified, ordered to give tylenol. No other complaints at this time. Spoke with family about patient condition and need for antibiotics and blood draws. No other questions at this time. Trudee KusterBrandi R [place]

## 2015-08-26 NOTE — Consult Note (Signed)
Reason for Consult:Vertigo Referring Physician: Kalisetti  CC: Dizziness  HPI: Kenneth [name] is an 82 y.o. male with complaints of dizziness.  History obtained from the wife.  It appears that the patient has had vertigo for the past 3 weeks. He has had associated nausea but no vomiting.  He is unable to transfer and walk unassisted due to the dizziness.  Has has several Epley maneuvers performed without improvement.  Has per the wife's report been on Meclizine as well without improvement.   Wife also reports problems with memory that have worsened recently.    Past Medical History  Diagnosis Date  . Diabetes mellitus   . Hyperlipidemia   . Hypertension   . Coronary artery disease     Lexiscan myoview (5/10) showed EF 45%, hypokinetic apex and lateral wall, fixed perfusion defect involving apex, inferoapex, and inferolateral wall, also some inferoapical ischemia; LHC (5/10) showed patent LIMA-LAD and totally occluded SVG-OM1, 99% pLAD, 40% ostial CFX, 40% mCFX, 30% OM1, 20% pRCA  . History of MRI of chest 11/10    Cardiac; EF 48%, mid anterolateral and inferolateral walls, apical lateral wall, apical septal wall, and true apex were all think and akinetic; 51-75% thickness subendocardial delayed enhancement in mid anterolateral and inferolateral walls, full thickness enhancement in apical laterla and apical septal walls and true apex  . Nephrolithiasis   . Spinal stenosis   . B12 deficiency   . Obesity   . OSA (obstructive sleep apnea)     Moderate    Past Surgical History  Procedure Laterality Date  . Back surgery      Multiple, with spinal stenosis  . Cataract extraction    . Coronary artery bypass graft  1999    In [place] with LIMA-LAD and SVG-OM1  . Total knee arthroplasty  5/10    Family History  Problem Relation Age of Onset  . Kidney failure Mother   . Heart failure Father     Social History:  reports that he has quit smoking. He does not have any smokeless  tobacco history on file. He reports that he does not drink alcohol. His drug history is not on file.  Allergies  Allergen Reactions  . Beef-Derived Products Hives  . Iodine Hives  . Ivp Dye [Iodinated Diagnostic Agents] Hives    Medications:  I have reviewed the patient's current medications. Prior to Admission:  Prescriptions prior to admission  Medication Sig Dispense Refill Last Dose  . aspirin 81 MG EC tablet Take 81 mg by mouth daily.     unknown at unknown  . glipiZIDE (GLUCOTROL) 5 MG tablet Take 5 mg by mouth daily.    unknown at unknown  . lisinopril (PRINIVIL,ZESTRIL) 20 MG tablet Take 20 mg by mouth daily.   unknown at unknown  . lovastatin (MEVACOR) 20 MG tablet Take 20 mg by mouth at bedtime.     unknown at unknown  . metFORMIN (GLUCOPHAGE) 1000 MG tablet Take 1,000 mg by mouth 2 (two) times daily.     unknown at unknown  . metoprolol (TOPROL-XL) 50 MG 24 hr tablet Take 50 mg by mouth daily.     unknown at unknown   Scheduled: . aspirin EC  81 mg Oral Daily  . glipiZIDE  5 mg Oral Daily  . heparin subcutaneous  5,000 Units Subcutaneous 3 times per day  . Influenza vac split quadrivalent PF  0.5 mL Intramuscular Tomorrow-1000  . insulin aspart  0-15 Units Subcutaneous TID WC  .   metoprolol succinate  50 mg Oral Daily  . pantoprazole  40 mg Oral Daily  . pravastatin  10 mg Oral q1800  . sodium chloride  3 mL Intravenous Q12H    ROS: History obtained from the patient  General ROS: negative for - chills, fatigue, fever, night sweats, weight gain or weight loss Psychological ROS: as noted in HPI Ophthalmic ROS: negative for - blurry vision, double vision, eye pain or loss of vision ENT ROS: as noted in HPI Allergy and Immunology ROS: negative for - hives or itchy/watery eyes Hematological and Lymphatic ROS: negative for - bleeding problems, bruising or swollen lymph nodes Endocrine ROS: negative for - galactorrhea, hair pattern changes, polydipsia/polyuria or  temperature intolerance Respiratory ROS: negative for - cough, hemoptysis, shortness of breath or wheezing Cardiovascular ROS: negative for - chest pain, dyspnea on exertion, edema or irregular heartbeat Gastrointestinal ROS: negative for - abdominal pain, diarrhea, hematemesis, nausea/vomiting or stool incontinence Genito-Urinary ROS: negative for - dysuria, hematuria, incontinence or urinary frequency/urgency Musculoskeletal ROS: negative for - joint swelling or muscular weakness Neurological ROS: as noted in HPI Dermatological ROS: negative for rash and skin lesion changes  Physical Examination: Blood pressure 134/78, pulse 122, temperature 97.7 F (36.5 C), temperature source Oral, resp. rate 20, height 5' 6" (1.676 m), weight 80.967 kg (178 lb 8 oz), SpO2 95 %.  GEN: NAD HEENT-  Normocephalic, no lesions, without obvious abnormality.  Normal external eye and conjunctiva.  Normal TM's bilaterally.  Normal auditory canals and external ears. Normal external nose, mucus membranes and septum.  Normal pharynx. Cardiovascular- S1, S2 normal, pulses palpable throughout   Lungs- chest clear, no wheezing, rales, normal symmetric air entry Abdomen- soft, non-tender; bowel sounds normal; no masses,  no organomegaly Extremities- no edema Lymph-no adenopathy palpable Musculoskeletal-no joint tenderness, deformity or swelling Skin-warm and dry, no hyperpigmentation, vitiligo, or suspicious lesions  Neurological Examination Mental Status: Alert, oriented to person, place and time.  Knows current Software engineer.  Reports that Tawni Pummel is the Medical illustrator.  Speech fluent without evidence of aphasia.  Requires reinforcement for 3 step commands. Cranial Nerves: II: Discs flat bilaterally; Visual fields grossly normal, pupils equal, round, reactive to light and accommodation III,IV, VI: ptosis not present, extra-ocular motions intact with nystagmus on left lateral gaze.  Patient symptomatic at that time as  well.   V,VII: smile symmetric, facial light touch sensation normal bilaterally VIII: hearing normal bilaterally IX,X: gag reflex present XI: bilateral shoulder shrug XII: midline tongue extension Motor: Right : Upper extremity   5/5    Left:     Upper extremity   5/5  Lower extremity   5/5     Lower extremity   5/5 Tone and bulk:normal tone throughout; no atrophy noted Sensory: Pinprick and light touch exam inconsistent Deep Tendon Reflexes: 2+ and symmetric with absent AJ's bilaterally Plantars: Right: downgoing   Left: downgoing Cerebellar: normal finger-to-nose and normal heel-to-shin testing bilaterally with UE tremor noted Gait: not tested due to safety concerns   Laboratory Studies:   Basic Metabolic Panel:  Recent Labs Lab 08/24/15 1144 08/25/15 0412 08/26/15 0438  NA 137 137 136  K 4.6 4.4 4.4  CL 104 105 102  CO2 _0 GLUCOSE 177* 203* 227*  BUN 36* 37* 43*  CREATININE 2.25* 2.14* 2.70*  CALCIUM 9.0 8.5* 8.3*    Liver Function Tests:  Recent Labs Lab 08/24/15 1144  AST 23  ALT 10*  ALKPHOS 62  BILITOT 0.7  PROT 7.5  ALBUMIN 3.7   No results for input(s): LIPASE, AMYLASE in the last 168 hours. No results for input(s): AMMONIA in the last 168 hours.  CBC:  Recent Labs Lab 08/24/15 1144 08/25/15 0412 08/26/15 0908  WBC 10.4 8.1 5.3  HGB 12.1* 10.6* 10.4*  HCT 36.5* 31.5* 31.3*  MCV 93.8 92.5 92.6  PLT 296 253 271    Cardiac Enzymes:  Recent Labs Lab 08/24/15 1144 08/24/15 1725 08/24/15 2242 08/25/15 0412  TROPONINI 0.18* 0.17* 0.17* 0.15*    BNP: Invalid input(s): POCBNP  CBG:  Recent Labs Lab 08/24/15 2113 08/25/15 0723 08/25/15 1304 08/25/15 1613 08/25/15 2053  GLUCAP 162* 165* 143* 185* 216*    Microbiology: No results found for this or any previous visit.  Coagulation Studies:  Recent Labs  08/24/15 1144  LABPROT 15.1*  INR 1.17    Urinalysis:  Recent Labs Lab 08/24/15 1144  COLORURINE YELLOW*   LABSPEC 1.018  PHURINE 5.0  GLUCOSEU 50*  HGBUR 3+*  BILIRUBINUR NEGATIVE  KETONESUR NEGATIVE  PROTEINUR 100*  NITRITE NEGATIVE  LEUKOCYTESUR NEGATIVE    Lipid Panel:     Component Value Date/Time   CHOL 113 08/24/2015 1202   TRIG 81 08/24/2015 1202   HDL 52 08/24/2015 1202   CHOLHDL 2.2 08/24/2015 1202   VLDL 16 08/24/2015 1202   LDLCALC 45 08/24/2015 1202    HgbA1C:  Lab Results  Component Value Date   HGBA1C 7.6* 08/24/2015    Urine Drug Screen:  No results found for: LABOPIA, COCAINSCRNUR, LABBENZ, AMPHETMU, THCU, LABBARB  Alcohol Level: No results for input(s): ETH in the last 168 hours.  Other results: EKG: sinus rhythm at 92 bpm, 1st degree AV block.  Imaging: Mr Mra Head Wo Contrast  08/25/2015  CLINICAL DATA:  Ataxia EXAM: MRA HEAD WITHOUT CONTRAST TECHNIQUE: Angiographic images of the Circle of Willis were obtained using MRA technique without intravenous contrast. COMPARISON:  MR head 08/25/2015 FINDINGS: Mild to moderate stenosis distal left vertebral artery. Right vertebral artery widely patent. Basilar widely patent. Superior cerebellar and posterior cerebral arteries patent bilaterally. Internal carotid artery widely patent bilaterally without significant stenosis. Hypoplastic right A1 segment. Both anterior cerebral arteries are patent and supplied from the left. Both middle cerebral arteries patent without significant stenosis. Negative for aneurysm or vascular malformation. IMPRESSION: Mild to moderate stenosis distal left vertebral artery. Otherwise no significant intracranial stenosis. Electronically Signed   By: Charles  Clark M.D.   On: 08/25/2015 13:58   Mr Angiogram Neck Wo Contrast  08/25/2015  CLINICAL DATA:  Ataxia EXAM: MRA NECK WITHOUT CONTRAST TECHNIQUE: Angiographic images of the neck were obtained using MRA technique without intravenous contrast. Carotid stenosis measurements (when applicable) are obtained utilizing NASCET criteria, using the  distal internal carotid diameter as the denominator. COMPARISON:  MR head 08/25/2015 FINDINGS: MRA NECK FINDINGS Image quality degraded by mild motion and lack of intravenous contrast. Carotid bifurcation widely patent bilaterally. Tortuosity of the cervical internal carotid artery bilaterally. Negative for stenosis or dissection Both vertebral arteries patent to the basilar. Mild stenosis distal left vertebral artery. IMPRESSION: No significant carotid stenosis. Mild stenosis distal left vertebral artery. Electronically Signed   By: Charles  Clark M.D.   On: 08/25/2015 13:15   Mr Brain Wo Contrast  08/25/2015  CLINICAL DATA:  Ataxia EXAM: MRI HEAD WITHOUT CONTRAST TECHNIQUE: Multiplanar, multiecho pulse sequences of the brain and surrounding structures were obtained without intravenous contrast. COMPARISON:  CT 08/02/2011 FINDINGS: Negative for acute infarct Moderate atrophy. Moderate chronic microvascular ischemic   change in the cerebral white matter. Mild chronic ischemia in the basal ganglia and pons. Chronic infarct high left frontal lobe. Negative for intracranial hemorrhage.  Negative for fluid collection Negative for mass or edema.  No shift of the midline structures. Paranasal sinuses clear.  Negative orbit.  Pituitary not enlarged. IMPRESSION: Atrophy and chronic ischemia.  No acute abnormality. Electronically Signed   By: Charles  Clark M.D.   On: 08/25/2015 12:56   Dg Chest Portable 1 View  08/24/2015  CLINICAL DATA:  82-year-old male with cough, fever and chills for 1 day. EXAM: PORTABLE CHEST 1 VIEW COMPARISON:  None. FINDINGS: Cardiomegaly and CABG changes identified. There is no evidence of focal airspace disease, pulmonary edema, suspicious pulmonary nodule/mass, pleural effusion, or pneumothorax. No acute bony abnormalities are identified. IMPRESSION: Cardiomegaly without acute cardiopulmonary disease. Electronically Signed   By: Jeffrey  Hu M.D.   On: 08/24/2015 12:25      Assessment/Plan: 82 year old male with complaints of persistent vertigo.  Neurological examination only significant for some cognitive issues and tremor, along with positional nystagmus.  Dure to persistence of symptoms, MRI of the brain has been performed. This has been personally reviewed and shows no acute changes.  There is significant periventricular small vessel disease.  Some mild distal left vertebra disease is noted as well.  Although patient's with extensive small vessel disease can have intractable vertigo, would not have expected this acute onset.  No evidence of acute CVA.  Unclear what patient was on at home.  Has not had the chance to use Meclizine here in the hospital.  Do suspect that symptoms may be exacerbated by worsening renal function and elevated blood sugars.    Recommendations: 1.  Agree with addressing blood sugars and renal function 2.  Vestibular exercises explained to wife and patient, to be performed 3 times per day. 3.  Change Meclizine to TID dosing and not prn.  Would start at 12.5 and increase to 25mg TID if tolerated.  If not responsive would try Valium at small doses.   4.  TSH, ESR, RPR, B12, ANA 5.  Continue ASA daily   , MD [facility] [phone] 08/26/2015, 11:54 AM       

## 2015-08-26 NOTE — Progress Notes (Signed)
ANTIBIOTIC CONSULT NOTE - INITIAL  Pharmacy Consult for Zosyn/Vancomycin Dosing Indication: rule out sepsis  Allergies  Allergen Reactions  . Beef-Derived Products Hives  . Iodine Hives  . Ivp Dye [Iodinated Diagnostic Agents] Hives    Patient Measurements: Height: 5\' 6"  (167.6 cm) Weight: 178 lb 8 oz (80.967 kg) IBW/kg (Calculated) : 63.8 Adjusted Body Weight: 70.7 kg  Vital Signs: Temp: 98.8 F (37.1 C) (01/04 1204) Temp Source: Oral (01/04 1204) BP: 141/61 mmHg (01/04 1203) Pulse Rate: 117 (01/04 1204) Intake/Output from previous day: 01/03 0701 - 01/04 0700 In: 360 [P.O.:360] Out: 900 [Urine:900] Intake/Output from this shift: Total I/O In: 240 [P.O.:240] Out: 420 [Urine:420]  Labs:  Recent Labs  08/24/15 1144 08/25/15 0412 08/26/15 0438 08/26/15 0908  WBC 10.4 8.1  --  5.3  HGB 12.1* 10.6*  --  10.4*  PLT 296 253  --  271  CREATININE 2.25* 2.14* 2.70*  --    Estimated Creatinine Clearance: 20 mL/min (by C-G formula based on Cr of 2.7). No results for input(s): VANCOTROUGH, VANCOPEAK, VANCORANDOM, GENTTROUGH, GENTPEAK, GENTRANDOM, TOBRATROUGH, TOBRAPEAK, TOBRARND, AMIKACINPEAK, AMIKACINTROU, AMIKACIN in the last 72 hours.   Microbiology: No results found for this or any previous visit (from the past 720 hour(s)).  Medical History: Past Medical History  Diagnosis Date  . Diabetes mellitus   . Hyperlipidemia   . Hypertension   . Coronary artery disease     Lexiscan myoview (5/10) showed EF 45%, hypokinetic apex and lateral wall, fixed perfusion defect involving apex, inferoapex, and inferolateral wall, also some inferoapical ischemia; LHC (5/10) showed patent LIMA-LAD and totally occluded SVG-OM1, 99% pLAD, 40% ostial CFX, 40% mCFX, 30% OM1, 20% pRCA  . History of MRI of chest 11/10    Cardiac; EF 48%, mid anterolateral and inferolateral walls, apical lateral wall, apical septal wall, and true apex were all think and akinetic; 51-75% thickness  subendocardial delayed enhancement in mid anterolateral and inferolateral walls, full thickness enhancement in apical laterla and apical septal walls and true apex  . Nephrolithiasis   . Spinal stenosis   . B12 deficiency   . Obesity   . OSA (obstructive sleep apnea)     Moderate    Medications:  Scheduled:  . aspirin EC  81 mg Oral Daily  . glipiZIDE  5 mg Oral Daily  . heparin subcutaneous  5,000 Units Subcutaneous 3 times per day  . Influenza vac split quadrivalent PF  0.5 mL Intramuscular Tomorrow-1000  . insulin aspart  0-15 Units Subcutaneous TID WC  . insulin glargine  10 Units Subcutaneous QHS  . meclizine  12.5 mg Oral TID  . metoprolol succinate  50 mg Oral Daily  . pantoprazole  40 mg Oral Daily  . piperacillin-tazobactam (ZOSYN)  IV  3.375 g Intravenous 3 times per day  . pravastatin  10 mg Oral q1800  . senna-docusate  2 tablet Oral Daily  . sodium chloride  3 mL Intravenous Q12H  . vancomycin  1,000 mg Intravenous Once  . [START ON 08/27/2015] vancomycin  1,000 mg Intravenous Q36H   Infusions:  . sodium chloride     Assessment: Pharmacy consulted to dose Vancomycin and Zosyn in an 82 yo male for empiric treatment of sepsis.    SCr: 2.7, est CrCl~20 mL/min, ke: 0.021, t1/2: 33 h, Vd: 49.5 L  Goal of Therapy:  Vancomycin trough level 15-20 mcg/ml  Plan:  1.  Will start patient on Vancomycin 1 gm IV once with stacked dose of 1 gm ~  16 hours later.  Will then start maintenance dosing of Vancomycin 1 gm IV q36h based on kinetic parameters. Will check trough prior to dose on 1/8 at 0600 (not at steady state).   2.  Will start patient on Zosyn 3.375 gm IV q8h per EI protocol.  If CrCl falls below 20 mL/min, will need to transition to q12h dosing.  Pharmacy will continue to follow.   Linh Johannes G 08/26/2015,3:15 PM

## 2015-08-26 NOTE — Care Management (Signed)
patient to transfer to skilled nursing facility when medically stable

## 2015-08-26 NOTE — Care Management Important Message (Signed)
Important Message  Patient Details  Name: Kenneth [name] T Liby MRN: [id number] Date of Birth: 01-10-1928   Medicare Important Message Given:  Yes    Olegario MessierKathy A Dhyan Noah 08/26/2015, 9:07 AM

## 2015-08-27 ENCOUNTER — Other Ambulatory Visit: Payer: Medicare Other

## 2015-08-27 LAB — BASIC METABOLIC PANEL
ANION GAP: 7 (ref 5–15)
BUN: 52 mg/dL — ABNORMAL HIGH (ref 6–20)
CHLORIDE: 105 mmol/L (ref 101–111)
CO2: 25 mmol/L (ref 22–32)
Calcium: 8.2 mg/dL — ABNORMAL LOW (ref 8.9–10.3)
Creatinine, Ser: 3.75 mg/dL — ABNORMAL HIGH (ref 0.61–1.24)
GFR calc Af Amer: 16 mL/min — ABNORMAL LOW (ref >60)
GFR, EST NON AFRICAN AMERICAN: 13 mL/min — AB (ref >60)
GLUCOSE: 170 mg/dL — AB (ref 65–99)
POTASSIUM: 4.7 mmol/L (ref 3.5–5.1)
SODIUM: 137 mmol/L (ref 135–145)

## 2015-08-27 LAB — GLUCOSE, CAPILLARY
GLUCOSE-CAPILLARY: 134 mg/dL — AB (ref 65–99)
GLUCOSE-CAPILLARY: 141 mg/dL — AB (ref 65–99)
GLUCOSE-CAPILLARY: 141 mg/dL — AB (ref 65–99)
Glucose-Capillary: 124 mg/dL — ABNORMAL HIGH (ref 65–99)

## 2015-08-27 LAB — CBC
HCT: 29.8 % — ABNORMAL LOW (ref 40.0–52.0)
HEMOGLOBIN: 10.1 g/dL — AB (ref 13.0–18.0)
MCH: 32 pg (ref 26.0–34.0)
MCHC: 34 g/dL (ref 32.0–36.0)
MCV: 94.1 fL (ref 80.0–100.0)
PLATELETS: 251 10*3/uL (ref 150–440)
RBC: 3.17 MIL/uL — AB (ref 4.40–5.90)
RDW: 13.7 % (ref 11.5–14.5)
WBC: 5.2 10*3/uL (ref 3.8–10.6)

## 2015-08-27 LAB — RPR: RPR Ser Ql: NONREACTIVE

## 2015-08-27 LAB — ANTINUCLEAR ANTIBODIES, IFA: ANA Ab, IFA: NEGATIVE

## 2015-08-27 MED ORDER — VANCOMYCIN HCL IN DEXTROSE 1-5 GM/200ML-% IV SOLN: 1000.0000 mg | INTRAVENOUS | Status: DC

## 2015-08-27 MED ORDER — PIPERACILLIN-TAZOBACTAM 3.375 G IVPB
3.3750 g | Freq: Two times a day (BID) | INTRAVENOUS | Status: DC
  Administered 2015-08-27 – 2015-08-28 (×2): 3.375 g via INTRAVENOUS
  Filled 2015-08-27 (×3): qty 50

## 2015-08-27 NOTE — Progress Notes (Signed)
   08/27/15 1130  Clinical Encounter Type  Visited With Patient and family together  Visit Type Spiritual support  Referral From Nurse  Consult/Referral To Chaplain  Spiritual Encounters  Spiritual Needs Emotional;Prayer  Stress Factors  Patient Stress Factors Family relationships  I received a page for a rapid response to this patient's room. Upon arriving, I discovered it was for the patient's wife who was unresponsive. Wife was taken to ED. I spent time with patient, praying and calming him. He was very emotional. Chaplain Cordelia PocheEmily Chauncey Sciulli

## 2015-08-27 NOTE — Progress Notes (Signed)
SATURATION QUALIFICATIONS: (This note is used to comply with regulatory documentation for home oxygen)  Patient Saturations on Room Air at Rest = 91%  Patient Saturations on Room Air while Ambulating = 90%  Patient Saturations on NA Liters of oxygen while Ambulating = NA  Principal FinancialBrandi R Mansfield

## 2015-08-27 NOTE — Progress Notes (Signed)
[facility] - [place] at [facility]   PATIENT NAME: Kenneth [name]    MR#:  [id number]  DATE OF BIRTH:  [birth year]  SUBJECTIVE:  CHIEF COMPLAINT:   Chief Complaint  Patient presents with  . Chest Pain   Doing better today. Talkative and comfortable this morning. Unfortunately his wife fell this morning in his room and is now in the emergency room here.  REVIEW OF SYSTEMS:  Review of Systems  Constitutional: Negative for fever and chills.  HENT: Negative for ear pain, nosebleeds and tinnitus.   Eyes: Negative for blurred vision.  Respiratory: Positive for cough and shortness of breath. Negative for wheezing.   Cardiovascular: Negative for chest pain and palpitations.  Gastrointestinal: Negative for nausea, vomiting, abdominal pain, diarrhea and constipation.  Genitourinary: Negative for dysuria, urgency and frequency.  Musculoskeletal: Negative for myalgias, back pain and neck pain.  Neurological: Positive for dizziness and weakness. Negative for tingling, tremors, sensory change, focal weakness, seizures and headaches.    DRUG ALLERGIES:   Allergies  Allergen Reactions  . Beef-Derived Products Hives  . Iodine Hives  . Ivp Dye [Iodinated Diagnostic Agents] Hives    VITALS:  Blood pressure 121/61, pulse 95, temperature 98.5 F (36.9 C), temperature source Oral, resp. rate 18, height 5\' 6"  (1.676 m), weight 80.967 kg (178 lb 8 oz), SpO2 89 %.  PHYSICAL EXAMINATION:  Physical Exam  GENERAL:  82 y.o.-year-old patient lying in the bed in no distress  EYES: Pupils equal, round, reactive to light and accommodation. No scleral icterus. Extraocular muscles intact.  HEENT: Head atraumatic, normocephalic. Oropharynx and nasopharynx clear.  NECK:  Supple, no jugular venous distention. No thyroid enlargement, no tenderness.  LUNGS: Normal breath sounds bilaterally, no wheezing, rales,rhonchi or crepitation. Minimal use of accessory muscles on exertion.  Decreased basilar breath sounds. Scattered rhonchi heard at the bases posteriorly CARDIOVASCULAR: S1, S2 normal. No rubs, or gallops. 3/6 systolic murmur is present ABDOMEN: Soft, nontender, nondistended. Bowel sounds present. No organomegaly or mass.  EXTREMITIES: No pedal edema, cyanosis, or clubbing.  NEUROLOGIC: Cranial nerves II through XII are intact. Muscle strength 4/5 in all extremities. Her lites weakness is present Sensation intact. Gait not checked.  PSYCHIATRIC: The patient is alert and oriented x 3.  SKIN: No obvious rash, lesion, or ulcer.    LABORATORY PANEL:   CBC  Recent Labs Lab 08/27/15 0520  WBC 5.2  HGB 10.1*  HCT 29.8*  PLT 251   ------------------------------------------------------------------------------------------------------------------  Chemistries   Recent Labs Lab 08/24/15 1144  08/27/15 0520  NA 137  < > 137  K 4.6  < > 4.7  CL 104  < > 105  CO2 26  < > 25  GLUCOSE 177*  < > 170*  BUN 36*  < > 52*  CREATININE 2.25*  < > 3.75*  CALCIUM 9.0  < > 8.2*  AST 23  --   --   ALT 10*  --   --   ALKPHOS 62  --   --   BILITOT 0.7  --   --   < > = values in this interval not displayed. ------------------------------------------------------------------------------------------------------------------  Cardiac Enzymes  Recent Labs Lab 08/25/15 0412  TROPONINI 0.15*   ------------------------------------------------------------------------------------------------------------------  RADIOLOGY:  Dg Chest 2 View  08/26/2015  CLINICAL DATA:  Cough, chest pain, fever EXAM: CHEST  2 VIEW COMPARISON:  08/24/2015 FINDINGS: Cardiomediastinal silhouette is stable. Status post median sternotomy. No acute infiltrate or pleural effusion. No pulmonary edema. Mild left  basilar atelectasis. IMPRESSION: No infiltrate or pulmonary edema. Cardiomegaly again noted. Mild left basilar atelectasis. Electronically Signed   By: Natasha MeadLiviu  Pop M.D.   On: 08/26/2015 16:12    Nm Pulmonary Perf And Vent  08/26/2015  CLINICAL DATA:  Tachycardia, asthma, COPD, bronchitis. EXAM: NUCLEAR MEDICINE VENTILATION - PERFUSION LUNG SCAN TECHNIQUE: Ventilation images were obtained in multiple projections using inhaled aerosol Tc-3788m DTPA. Perfusion images were obtained in multiple projections after intravenous injection of Tc-6488m MAA. RADIOPHARMACEUTICALS:  Thirty-two Technetium-6788m DTPA aerosol inhalation and 4.3 Technetium-3888m MAA IV COMPARISON:  08/24/2015 FINDINGS: Ventilation: There multiple ventilation defects. There is overall heterogeneous ventilation. Perfusion: There is a perfusion defect within the LEFT lower lobe which matches a ventilation defect. Seen on LPO projection. IMPRESSION: No evidence of pulmonary embolism. Matched ventilation-perfusion defects related to COPD. Electronically Signed   By: Genevive BiStewart  Edmunds M.D.   On: 08/26/2015 17:27    EKG:   Orders placed or performed during the hospital encounter of 08/24/15  . ED EKG  . ED EKG    ASSESSMENT AND PLAN:   Kenneth [name] is a 82 y.o. male with a known history of diabetes, hypertension, coronary disease status post CABG in the past comes to the emergency room with complaints of chest pain nonradiating with some nausea.  1. Elevated troponin with history of coronary artery disease status post CABG in the remote past. Likely demand ischemia. Troponins are stable.  Appreciate Cardiology consultation Continue cardiac medications.  2. Altered mental status-improved  likely metabolic encephalopathy. Discontinue Valium. -Fever1/4, isolated. Sent for blood cultures. Likely could be a respiratory source is also noted to have tachypnea and cough. Estee x-ray with no significant findings -Empirically on Vanco and Zosyn.Seems to be doing much better VQ scan negative   3. Dizziness-subacute.  improved  Seen by ENT as an outpatient. Started on meclizine with minimal help. -Discontinue Valium due to mental  status changes from it. -MRI of the brain and MRA of the head and neck without any acute findings -Physical therapy consult appreciated. Appreciate neurology consult. -exercises and therapy recommended. -Continue to monitor orthostatic blood pressure changes  4. CKD-IV: Baseline creatinine around 2.,  Avoid nephrotoxins-hold lisinopril -Gentle hydration today Patient advised to follow up with nephrology as outpatient  5. Hypertension- hold lisinopril for now given renal failure  6. Type 2 diabetes- sugars are elevated -continue glipizide. -Hold metformin given his CKD. On SSI -Added Lantus   7. DVT Prophylaxis- on subcutaneous heparin  All the records are reviewed and case discussed with Care Management/Social Workerr. Management plans discussed with the patient, family and they are in agreement.Discussed care plan with multiple family members today.  CODE STATUS: Full code  TOTAL CRITICAL CARE TIME spent in TAKING CARE OF THIS PATIENT: 40 minutes.   POSSIBLE D/C IN 1-2 DAYS, DEPENDING ON CLINICAL CONDITION.   Elby ShowersWALSH, Kiing Deakin M.D on 08/27/2015 at 5:20 PM  Between 7am to 6pm - Pager - [phone]  After 6pm go to [email or web address] - password [email or web address] [facility]  [facility]  Office  [phone]  CC: Primary care physician; Barbette ReichmannHANDE,VISHWANATH, MD

## 2015-08-27 NOTE — Progress Notes (Signed)
Pt doesn't wish to wear CPAP for sleep 

## 2015-08-27 NOTE — Plan of Care (Signed)
Problem: Physical Regulation: Goal: Ability to maintain clinical measurements within normal limits will improve Outcome: Progressing Patient is ambulating in the hallway without distress. No complaints of pain. No dyspnea noted. Pt is able to sit on the side of the bed without assistance. Patient has no complaints of dizziness. Goal: Will remain free from infection No elevated temperature this shift.  Problem: Fluid Volume: Goal: Ability to maintain a balanced intake and output will improve Patient has decreased urine ouput. Pt urinates about 50-100cc at intervals. Patient is not urinating every hour.

## 2015-08-27 NOTE — Progress Notes (Signed)
Physical Therapy Treatment Patient Details Name: Kenneth [name] MRN: [id number] DOB: [birth year] Today's Date: 08/27/2015    History of Present Illness presented to ER secondary to chest pain, nausea, LE weakness status post fall in home environment; admitted with possible NSTEMI (troponin peak 0.17), initially managed with heparin drip (now discontinued).  Of note, patient recently being treated for vertigo by ENT (x3 weeks) without improvement in symptoms.    PT Comments    Marked improvement in activity tolerance and performance this date compared to initial evaluation; however, remains generally unsteady in all standing postures.  Poor ability to respond to any external perturbations; high fall risk.  Continue to recommend transition to STR for optimal return to PLOF prior to discharge home.   Follow Up Recommendations  SNF     Equipment Recommendations       Recommendations for Other Services       Precautions / Restrictions Precautions Precautions: Fall Restrictions Weight Bearing Restrictions: No    Mobility  Bed Mobility Overal bed mobility: Needs Assistance Bed Mobility: Supine to Sit     Supine to sit: Supervision     General bed mobility comments: cuing for use of bedrails to assist as needed  Transfers Overall transfer level: Needs assistance Equipment used: Rolling walker (2 wheeled) Transfers: Sit to/from Stand Sit to Stand: Min assist         General transfer comment: cuing for hand placement; very broad BOS  Ambulation/Gait Ambulation/Gait assistance: Min assist Ambulation Distance (Feet): 220 Feet Assistive device: Rolling walker (2 wheeled)       General Gait Details: broad BOS with short, choppy steps; decreased ability to respond to external perturbation; high fall risk   Stairs            Wheelchair Mobility    Modified Rankin (Stroke Patients Only)       Balance Overall balance assessment: Needs  assistance Sitting-balance support: No upper extremity supported;Feet supported Sitting balance-Leahy Scale: Good     Standing balance support: Bilateral upper extremity supported Standing balance-Leahy Scale: Fair Standing balance comment: improved midline orientation, but requires bilat UE support and +1 from therapist at all times                    Cognition Arousal/Alertness: Awake/alert Behavior During Therapy: WFL for tasks assessed/performed Overall Cognitive Status: Within Functional Limits for tasks assessed                      Exercises Other Exercises Other Exercises: Toilet transfer, ambulatory with RW, min assist; frequent cuing for safety awareness (esp with obstacle negotiation and surface changes).    General Comments        Pertinent Vitals/Pain Pain Assessment: No/denies pain    Home Living                      Prior Function            PT Goals (current goals can now be found in the care plan section) Acute Rehab PT Goals Patient Stated Goal: patient unable to verbalize; per wife, to consider rehab prior to return home PT Goal Formulation: With patient/family Time For Goal Achievement: 09/08/15 Potential to Achieve Goals: Good Progress towards PT goals: Progressing toward goals    Frequency  Min 2X/week    PT Plan Current plan remains appropriate    Co-evaluation             End  of Session Equipment Utilized During Treatment: Gait belt Activity Tolerance: Patient tolerated treatment well Patient left: in bed;with call bell/phone within reach;with bed alarm set     Time: 1610-9604 PT Time Calculation (min) (ACUTE ONLY): 24 min  Charges:  $Gait Training: 8-22 mins $Therapeutic Activity: 8-22 mins                    G Codes:       Sumiko Ceasar H. Manson Passey, PT, DPT, NCS 08/27/2015, 4:11 PM [phone]

## 2015-08-27 NOTE — Progress Notes (Signed)
ANTIBIOTIC CONSULT NOTE - Follow Up  Pharmacy Consult for Zosyn/Vancomycin Dosing Indication: rule out sepsis  Allergies  Allergen Reactions  . Beef-Derived Products Hives  . Iodine Hives  . Ivp Dye [Iodinated Diagnostic Agents] Hives    Patient Measurements: Height: 5\' 6"  (167.6 cm) Weight: 178 lb 8 oz (80.967 kg) IBW/kg (Calculated) : 63.8 Adjusted Body Weight: 70.7 kg  Vital Signs: Temp: 98.5 F (36.9 C) (01/05 1255) Temp Source: Oral (01/05 1255) BP: 121/61 mmHg (01/05 1255) Pulse Rate: 95 (01/05 1255) Intake/Output from previous day: 01/04 0701 - 01/05 0700 In: 952 [P.O.:480; I.V.:222; IV Piggyback:250] Out: 520 [Urine:520] Intake/Output from this shift:    Labs:  Recent Labs  08/25/15 0412 08/26/15 0438 08/26/15 0908 08/27/15 0520  WBC 8.1  --  5.3 5.2  HGB 10.6*  --  10.4* 10.1*  PLT 253  --  271 251  CREATININE 2.14* 2.70*  --  3.75*   Estimated Creatinine Clearance: 14.4 mL/min (by C-G formula based on Cr of 3.75). No results for input(s): VANCOTROUGH, VANCOPEAK, VANCORANDOM, GENTTROUGH, GENTPEAK, GENTRANDOM, TOBRATROUGH, TOBRAPEAK, TOBRARND, AMIKACINPEAK, AMIKACINTROU, AMIKACIN in the last 72 hours.   Microbiology: No results found for this or any previous visit (from the past 720 hour(s)).  Medical History: Past Medical History  Diagnosis Date  . Diabetes mellitus   . Hyperlipidemia   . Hypertension   . Coronary artery disease     Lexiscan myoview (5/10) showed EF 45%, hypokinetic apex and lateral wall, fixed perfusion defect involving apex, inferoapex, and inferolateral wall, also some inferoapical ischemia; LHC (5/10) showed patent LIMA-LAD and totally occluded SVG-OM1, 99% pLAD, 40% ostial CFX, 40% mCFX, 30% OM1, 20% pRCA  . History of MRI of chest 11/10    Cardiac; EF 48%, mid anterolateral and inferolateral walls, apical lateral wall, apical septal wall, and true apex were all think and akinetic; 51-75% thickness subendocardial delayed  enhancement in mid anterolateral and inferolateral walls, full thickness enhancement in apical laterla and apical septal walls and true apex  . Nephrolithiasis   . Spinal stenosis   . B12 deficiency   . Obesity   . OSA (obstructive sleep apnea)     Moderate    Medications:  Scheduled:  . aspirin EC  81 mg Oral Daily  . glipiZIDE  5 mg Oral Daily  . heparin subcutaneous  5,000 Units Subcutaneous 3 times per day  . Influenza vac split quadrivalent PF  0.5 mL Intramuscular Tomorrow-1000  . insulin aspart  0-15 Units Subcutaneous TID WC  . insulin glargine  10 Units Subcutaneous QHS  . meclizine  12.5 mg Oral TID  . metoprolol succinate  50 mg Oral Daily  . pantoprazole  40 mg Oral Daily  . piperacillin-tazobactam (ZOSYN)  IV  3.375 g Intravenous Q12H  . pravastatin  10 mg Oral q1800  . senna-docusate  2 tablet Oral Daily  . sodium chloride  3 mL Intravenous Q12H  . [START ON 08/29/2015] vancomycin  1,000 mg Intravenous Q48H   Infusions:  . sodium chloride 60 mL/hr at 08/27/15 4098   Assessment: Pharmacy consulted to dose Vancomycin and Zosyn in an 82 yo male for empiric treatment of sepsis.    SCr: 3.75, est CrCl~14.4 mL/min, ke: 0.016, t1/2: 43.3 h, Vd: 49.5 L  Goal of Therapy:  Vancomycin trough level 15-20 mcg/ml  Plan:  1.  Will transition patient to Vancomycin 1 gm IV q48h based on decline in renal function and new kinetic parameters.  Will leave trough scheduled prior  to dose on 1/8 for monitoring purposes.   2.  Will transition patient to Zosyn 3.375 gm IV q12h per EI protocol as CrCl is now <20 mL/min.   Pharmacy will continue to follow.   Emmilyn Crooke G 08/27/2015,1:57 PM

## 2015-08-27 NOTE — Progress Notes (Signed)
PT Cancellation Note  Patient Details Name: Kenneth [name] T Tobia MRN: [id number] DOB: 1928-02-01   Cancelled Treatment:    Reason Eval/Treat Not Completed: Other (comment) (Update: after discussion with patient's primary RN, rapid response was for patient's wife.  Patient remains stable and appropriate for PT at this time.  Cleared for activity as appropriate.)   Kayce Betty H. Manson PasseyBrown, PT, DPT, NCS 08/27/2015, 1:21 PM [phone]

## 2015-08-27 NOTE — Care Management (Signed)
Patient has been assessed by neurology.  Meclizine has helped with dizziness.  Spiked temp of 101.3 within the last 24 hours but has been afebrile today.  blood cultures pending.  CXR result do not indicate evidence of infiltrates.  Would anticipate discharge to snf within next 24 hours if remains stable.  Of note- patient's wife suffered episode of unresponsiveness while in patient's room and sent to ED. It is  rumored evaluated under Code Stroke protocol

## 2015-08-27 NOTE — Progress Notes (Signed)
Patient has rested quietly tonight. No complaints of pain and no signs of discomfort or distress noted. Patient stated he's feeling better today. Nursing staff will continue to monitor. Lamonte RicherKara A Reyli Schroth, RN

## 2015-08-27 NOTE — Progress Notes (Signed)
Subjective: Patient with fever overnight.  Has received antibiotics.  Much improved this morning.  On Meclizine scheduled.  Reports no further dizziness.  Ambulating with a walker.    Objective: Current vital signs: BP 133/54 mmHg  Pulse 108  Temp(Src) 97.9 F (36.6 C) (Oral)  Resp 18  Ht _0  (1.676 m)  Wt 80.967 kg (178 lb 8 oz)  BMI 28.82 kg/m2  SpO2 95% Vital signs in last 24 hours: Temp:  [97.9 F (36.6 C)-101.3 F (38.5 C)] 97.9 F (36.6 C) (01/05 0508) Pulse Rate:  [93-117] 108 (01/05 0924) Resp:  [18] 18 (01/05 0508) BP: (89-141)/(44-79) 133/54 mmHg (01/05 0924) SpO2:  [90 %-98 %] 95 % (01/05 0635)  Intake/Output from previous day: 01/04 0701 - 01/05 0700 In: 952 [P.O.:480; I.V.:222; IV Piggyback:250] Out: 520 [Urine:520] Intake/Output this shift:   Nutritional status: Diet heart healthy/carb modified Room service appropriate?: Yes; Fluid consistency:: Thin  Neurologic Exam: Mental Status: Alert.  Speech fluent without evidence of aphasia. Able to follow simple commands. Cranial Nerves: II: Discs flat bilaterally; Visual fields grossly normal, pupils equal, round, reactive to light and accommodation III,IV, VI: ptosis not present, extra-ocular motions intact with nystagmus on left lateral gaze. Patient symptomatic at that time as well.  V,VII: smile symmetric, facial light touch sensation normal bilaterally VIII: hearing normal bilaterally IX,X: gag reflex present XI: bilateral shoulder shrug XII: midline tongue extension Motor: Right :Upper extremity 5/5Left: Upper extremity 5/5 Lower extremity 5/5Lower extremity 5/5 Tone and bulk:normal tone throughout; no atrophy noted Sensory: Pinprick and light touch intact bilaterally Deep Tendon Reflexes: 2+ and symmetric with absent AJ's bilaterally Plantars: Right:  downgoingLeft: downgoing Gait: uses rolling walker  Lab Results: Basic Metabolic Panel:  Recent Labs Lab 08/24/15 1144 08/25/15 0412 08/26/15 0438 08/27/15 0520  NA 137 137 136 137  K 4.6 4.4 4.4 4.7  CL 104 105 102 105  CO2 _1 GLUCOSE 177* 203* 227* 170*  BUN 36* 37* 43* 52*  CREATININE 2.25* 2.14* 2.70* 3.75*  CALCIUM 9.0 8.5* 8.3* 8.2*    Liver Function Tests:  Recent Labs Lab 08/24/15 1144  AST 23  ALT 10*  ALKPHOS 62  BILITOT 0.7  PROT 7.5  ALBUMIN 3.7   No results for input(s): LIPASE, AMYLASE in the last 168 hours. No results for input(s): AMMONIA in the last 168 hours.  CBC:  Recent Labs Lab 08/24/15 1144 08/25/15 0412 08/26/15 0908 08/27/15 0520  WBC 10.4 8.1 5.3 5.2  HGB 12.1* 10.6* 10.4* 10.1*  HCT 36.5* 31.5* 31.3* 29.8*  MCV 93.8 92.5 92.6 94.1  PLT 296 253 271 251    Cardiac Enzymes:  Recent Labs Lab 08/24/15 1144 08/24/15 1725 08/24/15 2242 08/25/15 0412  TROPONINI 0.18* 0.17* 0.17* 0.15*    Lipid Panel:  Recent Labs Lab 08/24/15 1202  CHOL 113  TRIG 81  HDL 52  CHOLHDL 2.2  VLDL 16  LDLCALC 45    CBG:  Recent Labs Lab 08/26/15 1202 08/26/15 1427 08/26/15 1716 08/26/15 1938 08/27/15 0722  GLUCAP 228* 262* 187* 197* 134*    Microbiology: No results found for this or any previous visit.  Coagulation Studies:  Recent Labs  08/24/15 1144  LABPROT 15.1*  INR 1.17    Imaging: Dg Chest 2 View  08/26/2015  CLINICAL DATA:  Cough, chest pain, fever EXAM: CHEST  2 VIEW COMPARISON:  08/24/2015 FINDINGS: Cardiomediastinal silhouette is stable. Status post median sternotomy. No acute infiltrate or pleural effusion. No pulmonary edema.  Mild left basilar atelectasis. IMPRESSION: No infiltrate or pulmonary edema. Cardiomegaly again noted. Mild left basilar atelectasis. Electronically Signed   By: Lahoma Crocker M.D.   On: 08/26/2015 16:12   Mr Jodene Nam Head Wo Contrast  08/25/2015   CLINICAL DATA:  Ataxia EXAM: MRA HEAD WITHOUT CONTRAST TECHNIQUE: Angiographic images of the Circle of Willis were obtained using MRA technique without intravenous contrast. COMPARISON:  MR head 08/25/2015 FINDINGS: Mild to moderate stenosis distal left vertebral artery. Right vertebral artery widely patent. Basilar widely patent. Superior cerebellar and posterior cerebral arteries patent bilaterally. Internal carotid artery widely patent bilaterally without significant stenosis. Hypoplastic right A1 segment. Both anterior cerebral arteries are patent and supplied from the left. Both middle cerebral arteries patent without significant stenosis. Negative for aneurysm or vascular malformation. IMPRESSION: Mild to moderate stenosis distal left vertebral artery. Otherwise no significant intracranial stenosis. Electronically Signed   By: Franchot Gallo M.D.   On: 08/25/2015 13:58   Mr Angiogram Neck Wo Contrast  08/25/2015  CLINICAL DATA:  Ataxia EXAM: MRA NECK WITHOUT CONTRAST TECHNIQUE: Angiographic images of the neck were obtained using MRA technique without intravenous contrast. Carotid stenosis measurements (when applicable) are obtained utilizing NASCET criteria, using the distal internal carotid diameter as the denominator. COMPARISON:  MR head 08/25/2015 FINDINGS: MRA NECK FINDINGS Image quality degraded by mild motion and lack of intravenous contrast. Carotid bifurcation widely patent bilaterally. Tortuosity of the cervical internal carotid artery bilaterally. Negative for stenosis or dissection Both vertebral arteries patent to the basilar. Mild stenosis distal left vertebral artery. IMPRESSION: No significant carotid stenosis. Mild stenosis distal left vertebral artery. Electronically Signed   By: Franchot Gallo M.D.   On: 08/25/2015 13:15   Mr Brain Wo Contrast  08/25/2015  CLINICAL DATA:  Ataxia EXAM: MRI HEAD WITHOUT CONTRAST TECHNIQUE: Multiplanar, multiecho pulse sequences of the brain and surrounding  structures were obtained without intravenous contrast. COMPARISON:  CT 08/02/2011 FINDINGS: Negative for acute infarct Moderate atrophy. Moderate chronic microvascular ischemic change in the cerebral white matter. Mild chronic ischemia in the basal ganglia and pons. Chronic infarct high left frontal lobe. Negative for intracranial hemorrhage.  Negative for fluid collection Negative for mass or edema.  No shift of the midline structures. Paranasal sinuses clear.  Negative orbit.  Pituitary not enlarged. IMPRESSION: Atrophy and chronic ischemia.  No acute abnormality. Electronically Signed   By: Franchot Gallo M.D.   On: 08/25/2015 12:56   Nm Pulmonary Perf And Vent  08/26/2015  CLINICAL DATA:  Tachycardia, asthma, COPD, bronchitis. EXAM: NUCLEAR MEDICINE VENTILATION - PERFUSION LUNG SCAN TECHNIQUE: Ventilation images were obtained in multiple projections using inhaled aerosol Tc-20mDTPA. Perfusion images were obtained in multiple projections after intravenous injection of Tc-965mAA. RADIOPHARMACEUTICALS:  Thirty-two Technetium-9935mPA aerosol inhalation and 4.3 Technetium-5m34m IV COMPARISON:  08/24/2015 FINDINGS: Ventilation: There multiple ventilation defects. There is overall heterogeneous ventilation. Perfusion: There is a perfusion defect within the LEFT lower lobe which matches a ventilation defect. Seen on LPO projection. IMPRESSION: No evidence of pulmonary embolism. Matched ventilation-perfusion defects related to COPD. Electronically Signed   By: StewSuzy Bouchard.   On: 08/26/2015 17:27    Medications:  I have reviewed the patient's current medications. Scheduled: . aspirin EC  81 mg Oral Daily  . glipiZIDE  5 mg Oral Daily  . heparin subcutaneous  5,000 Units Subcutaneous 3 times per day  . Influenza vac split quadrivalent PF  0.5 mL Intramuscular Tomorrow-1000  . insulin aspart  0-15 Units Subcutaneous  TID WC  . insulin glargine  10 Units Subcutaneous QHS  . meclizine  12.5 mg Oral  TID  . metoprolol succinate  50 mg Oral Daily  . pantoprazole  40 mg Oral Daily  . piperacillin-tazobactam (ZOSYN)  IV  3.375 g Intravenous 3 times per day  . pravastatin  10 mg Oral q1800  . senna-docusate  2 tablet Oral Daily  . sodium chloride  3 mL Intravenous Q12H  . vancomycin  1,000 mg Intravenous Q36H    Assessment/Plan: No further dizziness.  On Meclizine.  ESR >140.  TSH normal.  B12 low normal at 327.  A1c 7.6.  ANA and RPR are pending.  Renal function improved.    Recommendations: 1.  Agree with continued Meclizine 2.  Would repeat ESR as an outpatient once beyond acute illness.   LOS: 3 days   Alexis Goodell, MD [facility] [phone] 08/27/2015  10:42 AM

## 2015-08-27 NOTE — Progress Notes (Signed)
PT Cancellation Note  Patient Details Name: Kenneth [name] MRN: [id number] DOB: 10/11/1927   Cancelled Treatment:    Reason Eval/Treat Not Completed: Medical issues which prohibited therapy (Called to see patient for re-assessment this AM.  Upon treatment attempt, patient undergoing rapid response event with medical staff at bedside. Will hold treatment attempt at this time and continue efforts as medically appropriate.)   Kenneth [name], PT, DPT, NCS 08/27/2015, 11:32 AM [phone]

## 2015-08-28 ENCOUNTER — Inpatient Hospital Stay: Payer: Medicare Other

## 2015-08-28 LAB — URINALYSIS COMPLETE WITH MICROSCOPIC (ARMC ONLY)
BILIRUBIN URINE: NEGATIVE
Bacteria, UA: NONE SEEN
GLUCOSE, UA: 50 mg/dL — AB
KETONES UR: NEGATIVE mg/dL
LEUKOCYTES UA: NEGATIVE
Nitrite: NEGATIVE
Protein, ur: 100 mg/dL — AB
SQUAMOUS EPITHELIAL / LPF: NONE SEEN
Specific Gravity, Urine: 1.012 (ref 1.005–1.030)
pH: 5 (ref 5.0–8.0)

## 2015-08-28 LAB — CBC
HCT: 30.9 % — ABNORMAL LOW (ref 40.0–52.0)
Hemoglobin: 10.2 g/dL — ABNORMAL LOW (ref 13.0–18.0)
MCH: 30.9 pg (ref 26.0–34.0)
MCHC: 32.9 g/dL (ref 32.0–36.0)
MCV: 93.7 fL (ref 80.0–100.0)
PLATELETS: 281 10*3/uL (ref 150–440)
RBC: 3.3 MIL/uL — AB (ref 4.40–5.90)
RDW: 13.6 % (ref 11.5–14.5)
WBC: 6.5 10*3/uL (ref 3.8–10.6)

## 2015-08-28 LAB — BASIC METABOLIC PANEL
Anion gap: 8 (ref 5–15)
BUN: 69 mg/dL — AB (ref 6–20)
CALCIUM: 8.4 mg/dL — AB (ref 8.9–10.3)
CO2: 24 mmol/L (ref 22–32)
Chloride: 107 mmol/L (ref 101–111)
Creatinine, Ser: 4.6 mg/dL — ABNORMAL HIGH (ref 0.61–1.24)
GFR calc Af Amer: 12 mL/min — ABNORMAL LOW (ref >60)
GFR, EST NON AFRICAN AMERICAN: 10 mL/min — AB (ref >60)
Glucose, Bld: 165 mg/dL — ABNORMAL HIGH (ref 65–99)
POTASSIUM: 4.8 mmol/L (ref 3.5–5.1)
SODIUM: 139 mmol/L (ref 135–145)

## 2015-08-28 LAB — GLUCOSE, CAPILLARY
GLUCOSE-CAPILLARY: 159 mg/dL — AB (ref 65–99)
GLUCOSE-CAPILLARY: 188 mg/dL — AB (ref 65–99)
GLUCOSE-CAPILLARY: 157 mg/dL — AB (ref 65–99)
GLUCOSE-CAPILLARY: 181 mg/dL — AB (ref 65–99)

## 2015-08-28 MED ORDER — VANCOMYCIN HCL IN DEXTROSE 1-5 GM/200ML-% IV SOLN: 1000.0000 mg | INTRAVENOUS | Status: DC

## 2015-08-28 MED ORDER — AZITHROMYCIN 250 MG PO TABS
500.0000 mg | ORAL_TABLET | Freq: Every day | ORAL | Status: DC
  Administered 2015-08-28 – 2015-08-31 (×4): 500 mg via ORAL
  Filled 2015-08-28 (×4): qty 2

## 2015-08-28 MED ORDER — TAMSULOSIN HCL 0.4 MG PO CAPS
0.4000 mg | ORAL_CAPSULE | Freq: Every day | ORAL | Status: DC
  Administered 2015-08-28 – 2015-09-04 (×7): 0.4 mg via ORAL
  Filled 2015-08-28 (×7): qty 1

## 2015-08-28 NOTE — Progress Notes (Signed)
Patient refusing cpap.

## 2015-08-28 NOTE — Progress Notes (Signed)
Physical Therapy Treatment Patient Details Name: Kenneth [name] MRN: [id number] DOB: 27-May-1928 Today's Date: 08/28/2015    History of Present Illness presented to ER secondary to chest pain, nausea, LE weakness status post fall in home environment; admitted with possible NSTEMI (troponin peak 0.17), initially managed with heparin drip (now discontinued).  Of note, patient recently being treated for vertigo by ENT (x3 weeks) without improvement in symptoms.    PT Comments    Patient with noted increase in fatigue this date--decreased tolerance for gait distance, overall activity this date.  Mild wheezing, SOB noted with exertion--sats decreased 87-88% with exertion, but recover to >94% within 30-45 seconds of seated rest period. Remains unsteady in standing/gait trials; recommend continued use of RW and +1 assist at all times.   Follow Up Recommendations  SNF     Equipment Recommendations       Recommendations for Other Services       Precautions / Restrictions Precautions Precautions: Fall Restrictions Weight Bearing Restrictions: No    Mobility  Bed Mobility Overal bed mobility: Needs Assistance Bed Mobility: Supine to Sit;Sit to Supine     Supine to sit: Min assist Sit to supine: Supervision      Transfers Overall transfer level: Needs assistance   Transfers: Sit to/from Stand Sit to Stand: Min assist         General transfer comment: cuing for hand placement; very broad BOS.  Multiple attempts required to complete  Ambulation/Gait Ambulation/Gait assistance: Min assist Ambulation Distance (Feet): 120 Feet Assistive device: Rolling walker (2 wheeled)       General Gait Details: broad BOS with short, choppy steps; min cuing for body position within BOS of RW; decreased ability to respond to external perturbation; high fall risk   Stairs            Wheelchair Mobility    Modified Rankin (Stroke Patients Only)       Balance Overall balance  assessment: Needs assistance Sitting-balance support: No upper extremity supported;Feet supported Sitting balance-Leahy Scale: Good Sitting balance - Comments: mild R lateral lean with progressive fatigue; corrects with cuing from therapist   Standing balance support: Bilateral upper extremity supported Standing balance-Leahy Scale: Fair                      Cognition Arousal/Alertness: Awake/alert Behavior During Therapy: WFL for tasks assessed/performed Overall Cognitive Status: History of cognitive impairments - at baseline                      Exercises Other Exercises Other Exercises: Bilat LE seated therex, 1x15, AROM for muscular strength/endurance with functional activities.  Mild R lateral lean with fatigue, corrects with cuing from therapist.    General Comments        Pertinent Vitals/Pain Pain Assessment: No/denies pain    Home Living                      Prior Function            PT Goals (current goals can now be found in the care plan section) Acute Rehab PT Goals Patient Stated Goal: patient unable to verbalize; per wife, to consider rehab prior to return home PT Goal Formulation: With patient/family Time For Goal Achievement: 09/08/15 Potential to Achieve Goals: Good Progress towards PT goals: Progressing toward goals    Frequency  Min 2X/week    PT Plan Current plan remains appropriate  Co-evaluation             End of Session Equipment Utilized During Treatment: Gait belt Activity Tolerance: Patient limited by fatigue Patient left: in bed;with call bell/phone within reach;with bed alarm set     Time: [id number] PT Time Calculation (min) (ACUTE ONLY): 20 min  Charges:  $Gait Training: 8-22 mins                    G Codes:      Kenneth [name], PT, DPT, NCS 08/28/2015, 2:04 PM [phone]

## 2015-08-28 NOTE — Progress Notes (Signed)
ANTIBIOTIC CONSULT NOTE - Follow Up  Pharmacy Consult for Zosyn/Vancomycin Dosing Indication: rule out sepsis  Allergies  Allergen Reactions  . Beef-Derived Products Hives  . Iodine Hives  . Ivp Dye [Iodinated Diagnostic Agents] Hives    Patient Measurements: Height: 5\' 6"  (167.6 cm) Weight: 178 lb 8 oz (80.967 kg) IBW/kg (Calculated) : 63.8 Adjusted Body Weight: 70.7 kg  Vital Signs: Temp: 98.5 F (36.9 C) (01/06 0431) Temp Source: Oral (01/06 0431) BP: 153/68 mmHg (01/06 0600) Pulse Rate: 98 (01/06 0600) Intake/Output from previous day: 01/05 0701 - 01/06 0700 In: 1630 [P.O.:240; I.V.:1290; IV Piggyback:100] Out: 600 [Urine:600] Intake/Output from this shift:    Labs:  Recent Labs  08/26/15 0438 08/26/15 0908 08/27/15 0520 08/28/15 0528  WBC  --  5.3 5.2 6.5  HGB  --  10.4* 10.1* 10.2*  PLT  --  271 251 281  CREATININE 2.70*  --  3.75* 4.60*   Estimated Creatinine Clearance: 11.7 mL/min (by C-G formula based on Cr of 4.6). No results for input(s): VANCOTROUGH, VANCOPEAK, VANCORANDOM, GENTTROUGH, GENTPEAK, GENTRANDOM, TOBRATROUGH, TOBRAPEAK, TOBRARND, AMIKACINPEAK, AMIKACINTROU, AMIKACIN in the last 72 hours.   Microbiology: Recent Results (from the past 720 hour(s))  CULTURE, BLOOD (ROUTINE X 2) w Reflex to PCR ID Panel     Status: None (Preliminary result)   Collection Time: 08/26/15  1:47 PM  Result Value Ref Range Status   Specimen Description BLOOD RIGHT HAND  Final   Special Requests   Final    BOTTLES DRAWN AEROBIC AND ANAEROBIC 2CCAEROB,2CCANA   Culture NO GROWTH 2 DAYS  Final   Report Status PENDING  Incomplete  CULTURE, BLOOD (ROUTINE X 2) w Reflex to PCR ID Panel     Status: None (Preliminary result)   Collection Time: 08/26/15  1:56 PM  Result Value Ref Range Status   Specimen Description BLOOD LEFT HAND  Final   Special Requests   Final    BOTTLES DRAWN AEROBIC AND ANAEROBIC 2CCAEROB,1CCANA   Culture NO GROWTH 2 DAYS  Final   Report Status  PENDING  Incomplete    Medical History: Past Medical History  Diagnosis Date  . Diabetes mellitus   . Hyperlipidemia   . Hypertension   . Coronary artery disease     Lexiscan myoview (5/10) showed EF 45%, hypokinetic apex and lateral wall, fixed perfusion defect involving apex, inferoapex, and inferolateral wall, also some inferoapical ischemia; LHC (5/10) showed patent LIMA-LAD and totally occluded SVG-OM1, 99% pLAD, 40% ostial CFX, 40% mCFX, 30% OM1, 20% pRCA  . History of MRI of chest 11/10    Cardiac; EF 48%, mid anterolateral and inferolateral walls, apical lateral wall, apical septal wall, and true apex were all think and akinetic; 51-75% thickness subendocardial delayed enhancement in mid anterolateral and inferolateral walls, full thickness enhancement in apical laterla and apical septal walls and true apex  . Nephrolithiasis   . Spinal stenosis   . B12 deficiency   . Obesity   . OSA (obstructive sleep apnea)     Moderate    Medications:  Scheduled:  . aspirin EC  81 mg Oral Daily  . glipiZIDE  5 mg Oral Daily  . heparin subcutaneous  5,000 Units Subcutaneous 3 times per day  . Influenza vac split quadrivalent PF  0.5 mL Intramuscular Tomorrow-1000  . insulin aspart  0-15 Units Subcutaneous TID WC  . insulin glargine  10 Units Subcutaneous QHS  . meclizine  12.5 mg Oral TID  . metoprolol succinate  50 mg  Oral Daily  . pantoprazole  40 mg Oral Daily  . piperacillin-tazobactam (ZOSYN)  IV  3.375 g Intravenous Q12H  . pravastatin  10 mg Oral q1800  . senna-docusate  2 tablet Oral Daily  . sodium chloride  3 mL Intravenous Q12H  . [START ON 08/29/2015] vancomycin  1,000 mg Intravenous Q48H   Infusions:  . sodium chloride 60 mL/hr at 08/28/15 0600   Assessment: Pharmacy consulted to dose Vancomycin and Zosyn in an 82 yo male for empiric treatment of sepsis.    SCr: 4.6, est CrCl~11.7 mL/min, ke: 0.014, t1/2: 49.5 h, Vd: 49.5 L  Goal of Therapy:  Vancomycin trough level  15-20 mcg/ml  Plan:  1.  Renal function continuing to decline.  SCr up to 4.6 today.  With new kinetic parameters, patient would need q60h dosing for Vancomycin.  Will order a random trough for 1/7 at 0700 to help guide when to administer next dose.  Currently orders are entered for Vancomycin 1 gm IV q48h but next dose not due until 60 hours after last dose.  Last dose on 1/7 at 0530.  Will need to adjust dose if further change in renal function.  Will follow closely.  2.  Will continue Zosyn 3.375 gm IV q12h per EI protocol as CrCl is now <20 mL/min.   Pharmacy will continue to follow.   Ladawn Boullion G 08/28/2015,12:18 PM

## 2015-08-28 NOTE — Consult Note (Signed)
[facility]  CONSULT NOTE    Date: 08/28/2015                  Patient Name:  Kenneth [name]  MRN: [id number]  DOB: Feb 04, 1928  Age / Sex: 82 y.o., male         PCP: Tracie Harrier, MD                 Service Requesting Consult: Dr. Volanda Napoleon                 Reason for Consult: Acute Renal Failure            History of Present Illness: [name] is a 82 y.o. white male diabetes mellitus type II, hypertension, hyperlipidemia, coronary artery disease,  Allergic rhinitis, osteoarthritis, polymyalgia rheumatica, obstructive sleep apnea,  who was admitted to [facility] on 08/24/2015 for NSTEMI (non-ST elevated myocardial infarction) ([place]) [I21.4]  Patient seems to have baseline creatinine of 1.8 with eGFR of 36. However creatinine has trended to 4.6 and nephrology has been called. Patient placed on NS at 58.   Patient is unable to give much of a history. He seems to have some dementia underlying.   Hypotensive on 1/5. However creatinine rising before then. No contrast exposure.    Medications: Outpatient medications: Prescriptions prior to admission  Medication Sig Dispense Refill Last Dose  . aspirin 81 MG EC tablet Take 81 mg by mouth daily.     unknown at unknown  . glipiZIDE (GLUCOTROL) 5 MG tablet Take 5 mg by mouth daily.    unknown at unknown  . lisinopril (PRINIVIL,ZESTRIL) 20 MG tablet Take 20 mg by mouth daily.   unknown at unknown  . lovastatin (MEVACOR) 20 MG tablet Take 20 mg by mouth at bedtime.     unknown at unknown  . metFORMIN (GLUCOPHAGE) 1000 MG tablet Take 1,000 mg by mouth 2 (two) times daily.     unknown at unknown  . metoprolol (TOPROL-XL) 50 MG 24 hr tablet Take 50 mg by mouth daily.     unknown at unknown    Current medications: Current Facility-Administered Medications  Medication Dose Route Frequency Provider Last Rate Last Dose  . 0.9 %  sodium chloride infusion  250 mL Intravenous PRN Fritzi Mandes, MD      . 0.9 %  sodium  chloride infusion   Intravenous Continuous Gladstone Lighter, MD 60 mL/hr at 08/28/15 1716    . acetaminophen (TYLENOL) tablet 650 mg  650 mg Oral Q6H PRN Fritzi Mandes, MD   650 mg at 08/26/15 2044   Or  . acetaminophen (TYLENOL) suppository 650 mg  650 mg Rectal Q6H PRN Fritzi Mandes, MD      . aspirin EC tablet 81 mg  81 mg Oral Daily Erma Heritage, [place]   81 mg at 08/28/15 0830  . azithromycin (ZITHROMAX) tablet 500 mg  500 mg Oral Daily Aldean Jewett, MD   500 mg at 08/28/15 1715  . heparin injection 5,000 Units  5,000 Units Subcutaneous 3 times per day Gladstone Lighter, MD   5,000 Units at 08/28/15 1311  . Influenza vac split quadrivalent PF (FLUARIX) injection 0.5 mL  0.5 mL Intramuscular Tomorrow-1000 Fritzi Mandes, MD   0.5 mL at 08/25/15 1802  . insulin aspart (novoLOG) injection 0-15 Units  0-15 Units Subcutaneous TID WC Fritzi Mandes, MD   3 Units at 08/28/15 1715  . insulin glargine (LANTUS) injection 10 Units  10 Units  Subcutaneous QHS Gladstone Lighter, MD   10 Units at 08/27/15 2153  . ipratropium-albuterol (DUONEB) 0.5-2.5 (3) MG/3ML nebulizer solution 3 mL  3 mL Nebulization Q4H PRN Gladstone Lighter, MD   3 mL at 08/28/15 0925  . meclizine (ANTIVERT) tablet 12.5 mg  12.5 mg Oral TID Alexis Goodell, MD   12.5 mg at 08/28/15 1312  . metoprolol succinate (TOPROL-XL) 24 hr tablet 50 mg  50 mg Oral Daily Fritzi Mandes, MD   50 mg at 08/28/15 0830  . pantoprazole (PROTONIX) EC tablet 40 mg  40 mg Oral Daily Fritzi Mandes, MD   40 mg at 08/28/15 0829  . polyethylene glycol (MIRALAX / GLYCOLAX) packet 17 g  17 g Oral Daily PRN Gladstone Lighter, MD      . pravastatin (PRAVACHOL) tablet 10 mg  10 mg Oral q1800 Fritzi Mandes, MD   10 mg at 08/28/15 1715  . senna-docusate (Senokot-S) tablet 2 tablet  2 tablet Oral Daily Gladstone Lighter, MD   2 tablet at 08/28/15 0830  . sodium chloride 0.9 % injection 3 mL  3 mL Intravenous Q12H Fritzi Mandes, MD   3 mL at 08/26/15 2118  . sodium chloride 0.9 %  injection 3 mL  3 mL Intravenous PRN Fritzi Mandes, MD      . tamsulosin (FLOMAX) capsule 0.4 mg  0.4 mg Oral Daily Aldean Jewett, MD   0.4 mg at 08/28/15 1715      Allergies: Allergies  Allergen Reactions  . Beef-Derived Products Hives  . Iodine Hives  . Ivp Dye [Iodinated Diagnostic Agents] Hives      Past Medical History: Past Medical History  Diagnosis Date  . Diabetes mellitus   . Hyperlipidemia   . Hypertension   . Coronary artery disease     Lexiscan myoview (5/10) showed EF 45%, hypokinetic apex and lateral wall, fixed perfusion defect involving apex, inferoapex, and inferolateral wall, also some inferoapical ischemia; LHC (5/10) showed patent LIMA-LAD and totally occluded SVG-OM1, 99% pLAD, 40% ostial CFX, 40% mCFX, 30% OM1, 20% pRCA  . History of MRI of chest 11/10    Cardiac; EF 48%, mid anterolateral and inferolateral walls, apical lateral wall, apical septal wall, and true apex were all think and akinetic; 51-75% thickness subendocardial delayed enhancement in mid anterolateral and inferolateral walls, full thickness enhancement in apical laterla and apical septal walls and true apex  . Nephrolithiasis   . Spinal stenosis   . B12 deficiency   . Obesity   . OSA (obstructive sleep apnea)     Moderate     Past Surgical History: Past Surgical History  Procedure Laterality Date  . Back surgery      Multiple, with spinal stenosis  . Cataract extraction    . Coronary artery bypass graft  1999    In [place] with LIMA-LAD and SVG-OM1  . Total knee arthroplasty  5/10     Family History: Family History  Problem Relation Age of Onset  . Kidney failure Mother   . Heart failure Father      Social History: Social History   Social History  . Marital Status: Married    Spouse Name: N/A  . Number of Children: 1  . Years of Education: N/A   Occupational History  . Retired    Social History Main Topics  . Smoking status: Former Research scientist (life sciences)  . Smokeless  tobacco: Not on file  . Alcohol Use: No  . Drug Use: Not on file  . Sexual Activity:  Not on file   Other Topics Concern  . Not on file   Social History Narrative   Lives in [place].   Has 1 daughter in [place] and 3 grandchildren.   Daughter has a country music band.     Review of Systems: Review of Systems  Unable to perform ROS: dementia    Vital Signs: Blood pressure 152/68, pulse 98, temperature 97.9 F (36.6 C), temperature source Axillary, resp. rate 21, height _0  (1.676 m), weight 80.967 kg (178 lb 8 oz), SpO2 92 %.  Weight trends: Filed Weights   08/24/15 1038 08/24/15 1547  Weight: 83.915 kg (185 lb) 80.967 kg (178 lb 8 oz)    Physical Exam: General: NAD, laying in bed  Head: Normocephalic, atraumatic. Moist oral mucosal membranes  Eyes: Anicteric, PERRL  Neck: Supple, trachea midline  Lungs:  Clear to auscultation  Heart: Regular rate and rhythm  Abdomen:  Soft, nontender,   Extremities: no peripheral edema.  Neurologic: Nonfocal, moving all four extremities  Skin: No lesions        Lab results: Basic Metabolic Panel:  Recent Labs Lab 08/26/15 0438 08/27/15 0520 08/28/15 0528  NA 136 137 139  K 4.4 4.7 4.8  CL 102 105 107  CO2 _1 GLUCOSE 227* 170* 165*  BUN 43* 52* 69*  CREATININE 2.70* 3.75* 4.60*  CALCIUM 8.3* 8.2* 8.4*    Liver Function Tests:  Recent Labs Lab 08/24/15 1144  AST 23  ALT 10*  ALKPHOS 62  BILITOT 0.7  PROT 7.5  ALBUMIN 3.7   No results for input(s): LIPASE, AMYLASE in the last 168 hours. No results for input(s): AMMONIA in the last 168 hours.  CBC:  Recent Labs Lab 08/24/15 1144 08/25/15 0412 08/26/15 0908 08/27/15 0520 08/28/15 0528  WBC 10.4 8.1 5.3 5.2 6.5  HGB 12.1* 10.6* 10.4* 10.1* 10.2*  HCT 36.5* 31.5* 31.3* 29.8* 30.9*  MCV 93.8 92.5 92.6 94.1 93.7  PLT 296 253 271 251 281    Cardiac Enzymes:  Recent Labs Lab 08/24/15 1144 08/24/15 1725 08/24/15 2242 08/25/15 0412   TROPONINI 0.18* 0.17* 0.17* 0.15*    BNP: Invalid input(s): POCBNP  CBG:  Recent Labs Lab 08/27/15 1218 08/27/15 1710 08/28/15 0726 08/28/15 1304 08/28/15 1635  GLUCAP 141* 141* 159* 188* 157*    Microbiology: Results for orders placed or performed during the hospital encounter of 08/24/15  CULTURE, BLOOD (ROUTINE X 2) w Reflex to PCR ID Panel     Status: None (Preliminary result)   Collection Time: 08/26/15  1:47 PM  Result Value Ref Range Status   Specimen Description BLOOD RIGHT HAND  Final   Special Requests   Final    BOTTLES DRAWN AEROBIC AND ANAEROBIC 2CCAEROB,2CCANA   Culture NO GROWTH 2 DAYS  Final   Report Status PENDING  Incomplete  CULTURE, BLOOD (ROUTINE X 2) w Reflex to PCR ID Panel     Status: None (Preliminary result)   Collection Time: 08/26/15  1:56 PM  Result Value Ref Range Status   Specimen Description BLOOD LEFT HAND  Final   Special Requests   Final    BOTTLES DRAWN AEROBIC AND ANAEROBIC 2CCAEROB,1CCANA   Culture NO GROWTH 2 DAYS  Final   Report Status PENDING  Incomplete    Coagulation Studies: No results for input(s): LABPROT, INR in the last 72 hours.  Urinalysis:  Recent Labs  08/28/15 1311  COLORURINE YELLOW*  LABSPEC 1.012  PHURINE 5.0  GLUCOSEU 50*  HGBUR 3+*  BILIRUBINUR NEGATIVE  KETONESUR NEGATIVE  PROTEINUR 100*  NITRITE NEGATIVE  LEUKOCYTESUR NEGATIVE      Imaging: US Renal  08/28/2015  CLINICAL DATA:  Acute renal failure EXAM: RENAL / URINARY TRACT ULTRASOUND COMPLETE COMPARISON:  02/22/2014 FINDINGS: Right Kidney: Length: 10.8 cm. Normal echogenicity. Mild cortical thinning probable due to atrophy stable from prior exam. No hydronephrosis. No renal calculus. Left Kidney: Length: 12.6 cm in length. Echogenicity within normal limits. No mass or hydronephrosis visualized. Bladder: Appears normal for degree of bladder distention. IMPRESSION: No hydronephrosis or renal calculi. Unremarkable urinary bladder. Stable mild  cortical thinning right kidney probable due to atrophy. Electronically Signed   By: Lahoma Crocker M.D.   On: 08/28/2015 12:10      Assessment & Plan: Mr. MIQUEL STACKS is a 82 y.o. white male diabetes mellitus type II, hypertension, hyperlipidemia, coronary artery disease,  Allergic rhinitis, osteoarthritis, polymyalgia rheumatica, obstructive sleep apnea,  who was admitted to [facility] on 08/24/2015 for NSTEMI (non-ST elevated myocardial infarction) ([place]) [I21.4]  1. Acute renal failure on chronic kidney disease stage III with proteinuria: baseline creatinine of 1.8, eGFR of 36 on 01/22/2015.  Acute renal failure for unclear cause. May be due to hypotension and ATN. Lisinopril has been held since yesterday. Vancomycin has not been stopped.  Chronic kidney disease secondary to diabetic nephropathy Renal ultrasound reviewed - Check random vanco level - Continue IV fluids  - Monitor renal function, urine output, and electrolytes. No acute indication for dialysis at this time.   2. Hypertension: currently mildly elevated.  - holding lisinopril. Not on a diuretic - Currently on metoprolol and tamsulosin  3. Diabetes Mellitus type II with chronic kidney disease: insulin dependent - holding metformin. Do not recommend restarting this medication - hemoglobin A1c of 7.6% on admission, not at goal.   4. Anemia of chronic kidney disease: hemoglobin of 10.2. Normocytic        LOS: 4 Mischa Brittingham 1/6/20175:43 PM

## 2015-08-28 NOTE — Care Management Important Message (Signed)
Important Message  Patient Details  Name: Kenneth [name] MRN: [id number] Date of Birth: 04-10-1928   Medicare Important Message Given:  Yes    Olegario MessierKathy A Wynema Garoutte 08/28/2015, 11:40 AM

## 2015-08-28 NOTE — Progress Notes (Signed)
[facility] [place] [facility]   PATIENT NAME: Kenneth [name]    MR#:  [id number]  DATE OF BIRTH:  19-Nov-1927  SUBJECTIVE:  CHIEF COMPLAINT:   Chief Complaint  Patient presents with  . Chest Pain   Feeling well this morning. No specific complaints. Talkative. His daughter is at the bedside.  REVIEW OF SYSTEMS:  Review of Systems  Constitutional: Negative for fever and chills.  HENT: Negative for ear pain, nosebleeds and tinnitus.   Eyes: Negative for blurred vision.  Respiratory: Positive for cough and shortness of breath. Negative for wheezing.   Cardiovascular: Negative for chest pain and palpitations.  Gastrointestinal: Negative for nausea, vomiting, abdominal pain, diarrhea and constipation.  Genitourinary: Negative for dysuria, urgency and frequency.  Musculoskeletal: Negative for myalgias, back pain and neck pain.  Neurological: Positive for dizziness and weakness. Negative for tingling, tremors, sensory change, focal weakness, seizures and headaches.    DRUG ALLERGIES:   Allergies  Allergen Reactions  . Beef-Derived Products Hives  . Iodine Hives  . Ivp Dye [Iodinated Diagnostic Agents] Hives    VITALS:  Blood pressure 152/68, pulse 98, temperature 97.9 F (36.6 C), temperature source Axillary, resp. rate 21, height 5\' 6"  (1.676 m), weight 80.967 kg (178 lb 8 oz), SpO2 92 %.  PHYSICAL EXAMINATION:  Physical Exam  GENERAL:  82 y.o.-year-old patient lying in the bed in no distress  EYES: Pupils equal, round, reactive to light and accommodation. No scleral icterus. Extraocular muscles intact.  HEENT: Head atraumatic, normocephalic. Oropharynx and nasopharynx clear.  NECK:  Supple, no jugular venous distention. No thyroid enlargement, no tenderness.  LUNGS: Normal breath sounds bilaterally, no wheezing, rales,rhonchi or crepitation. Minimal use of accessory muscles on exertion. Decreased basilar breath sounds. Scattered rhonchi heard at  the bases posteriorly CARDIOVASCULAR: S1, S2 normal. No rubs, or gallops. 3/6 systolic murmur is present ABDOMEN: Soft, nontender, nondistended. Bowel sounds present. No organomegaly or mass.  EXTREMITIES: No pedal edema, cyanosis, or clubbing.  NEUROLOGIC: Cranial nerves II through XII are intact. Muscle strength 4/5 in all extremities. Her lites weakness is present Sensation intact. Gait not checked.  PSYCHIATRIC: The patient is alert and oriented x 3.  SKIN: No obvious rash, lesion, or ulcer.    LABORATORY PANEL:   CBC  Recent Labs Lab 08/28/15 0528  WBC 6.5  HGB 10.2*  HCT 30.9*  PLT 281   ------------------------------------------------------------------------------------------------------------------  Chemistries   Recent Labs Lab 08/24/15 1144  08/28/15 0528  NA 137  < > 139  K 4.6  < > 4.8  CL 104  < > 107  CO2 26  < > 24  GLUCOSE 177*  < > 165*  BUN 36*  < > 69*  CREATININE 2.25*  < > 4.60*  CALCIUM 9.0  < > 8.4*  AST 23  --   --   ALT 10*  --   --   ALKPHOS 62  --   --   BILITOT 0.7  --   --   < > = values in this interval not displayed. ------------------------------------------------------------------------------------------------------------------  Cardiac Enzymes  Recent Labs Lab 08/25/15 0412  TROPONINI 0.15*   ------------------------------------------------------------------------------------------------------------------  RADIOLOGY:  US Renal  08/28/2015  CLINICAL DATA:  Acute renal failure EXAM: RENAL / URINARY TRACT ULTRASOUND COMPLETE COMPARISON:  02/22/2014 FINDINGS: Right Kidney: Length: 10.8 cm. Normal echogenicity. Mild cortical thinning probable due to atrophy stable from prior exam. No hydronephrosis. No renal calculus. Left Kidney: Length: 12.6 cm in length. Echogenicity  within normal limits. No mass or hydronephrosis visualized. Bladder: Appears normal for degree of bladder distention. IMPRESSION: No hydronephrosis or renal calculi.  Unremarkable urinary bladder. Stable mild cortical thinning right kidney probable due to atrophy. Electronically Signed   By: Natasha MeadLiviu  Pop M.D.   On: 08/28/2015 12:10   Nm Pulmonary Perf And Vent  08/26/2015  CLINICAL DATA:  Tachycardia, asthma, COPD, bronchitis. EXAM: NUCLEAR MEDICINE VENTILATION - PERFUSION LUNG SCAN TECHNIQUE: Ventilation images were obtained in multiple projections using inhaled aerosol Tc-1920m DTPA. Perfusion images were obtained in multiple projections after intravenous injection of Tc-7520m MAA. RADIOPHARMACEUTICALS:  Thirty-two Technetium-6220m DTPA aerosol inhalation and 4.3 Technetium-5020m MAA IV COMPARISON:  08/24/2015 FINDINGS: Ventilation: There multiple ventilation defects. There is overall heterogeneous ventilation. Perfusion: There is a perfusion defect within the LEFT lower lobe which matches a ventilation defect. Seen on LPO projection. IMPRESSION: No evidence of pulmonary embolism. Matched ventilation-perfusion defects related to COPD. Electronically Signed   By: Genevive BiStewart  Edmunds M.D.   On: 08/26/2015 17:27    EKG:   Orders placed or performed during the hospital encounter of 08/24/15  . ED EKG  . ED EKG    ASSESSMENT AND PLAN:   [name] is a 82 y.o. male with a known history of diabetes, hypertension, coronary disease status post CABG in the past comes to the emergency room with complaints of chest pain nonradiating with some nausea.  1. Elevated troponin with history of coronary artery disease status post CABG in the remote past. Likely demand ischemia. Troponins are stable.  Appreciate Cardiology consultation Continue cardiac medications.  2. Altered mental status-improved  Likely metabolic encephalopathy now resolved -Fever on 1/4, isolated, has not recurred. Blood cultures NTD. Possible bronchitis. Chest x-ray clear. Patient has cough and some shortness of breath. Empirically on Vanco and Zosyn, I will simplify to a azithromycin. VQ scan negative for  PE  3. Dizziness-subacute.  improved  Seen by ENT as an outpatient. Started on meclizine with minimal help. -Discontinue Valium due to mental status changes from it. -MRI of the brain and MRA of the head and neck without any acute findings -Physical therapy consult appreciated. Appreciate neurology consult. -Vestibular exercises and therapy recommended.  4. Acute on CKD-IV: Baseline creatinine around 2.,  Acute worsening over the past 2 days. He has been receiving IV fluids. No nephrotoxins administered. No imaging or contrast exposure. He was hypotensive briefly. His UA is showing too numerous to count red blood cells. He has a history of nephrolithiasis but does not have any pain. Renal ultrasound today showing no calculi or hydronephrosis. Nephrology consultation is pending.  5. Hypertension- hold lisinopril for now given renal failure  6. Type 2 diabetes- sugars are elevated -continue glipizide. -Hold metformin given his CKD. On SSI -Added Lantus   7. DVT Prophylaxis- on subcutaneous heparin  All the records are reviewed and case discussed with Care Management/Social Workerr. Management plans discussed with the patient, family and they are in agreement. Discussed care plan with his daughter at the bedside today  CODE STATUS: Full code  TOTAL CRITICAL CARE TIME spent in TAKING CARE OF THIS PATIENT: 40 minutes.   POSSIBLE D/C IN 1-2 DAYS, DEPENDING ON CLINICAL CONDITION.   Elby ShowersWALSH, Vernice Bowker M.D on 08/28/2015 at 4:30 PM  Between 7am to 6pm - Pager - [phone]  After 6pm go to [email or web address] - password [email or web address] [facility]  [facility]  Office  [phone]  CC: Primary care physician; Barbette ReichmannHANDE,VISHWANATH, MD

## 2015-08-28 NOTE — Progress Notes (Signed)
   08/28/15 1030  Clinical Encounter Type  Visited With Patient and family together  Visit Type Follow-up;Spiritual support  Referral From Chaplain  Consult/Referral To Chaplain  Spiritual Encounters  Spiritual Needs Prayer  Stress Factors  Patient Stress Factors Family relationships;Health changes  Met w/patient & family to provide pastoral care and prayer.  Chap. Becci Batty G. [place]

## 2015-08-28 NOTE — Progress Notes (Addendum)
Per MD patient is not medically stable for D/C today. Clinical Child psychotherapistocial Worker ([name]) contacted [employer] [facility] liaison and made him aware of above. Per [name] he will have to look at bed availability over the weekend and will call [name] back.   [name] called [name] back and reported that patient will have a bed on Sunday if medically stable. [name] left patient's daughter [name] a Engineer, technical salesvoicemail. [name] will continue to follow and assist as needed.   Kenneth [name], LCSWA [phone]

## 2015-08-29 LAB — GLUCOSE, CAPILLARY
Glucose-Capillary: 176 mg/dL — ABNORMAL HIGH (ref 65–99)
Glucose-Capillary: 185 mg/dL — ABNORMAL HIGH (ref 65–99)
Glucose-Capillary: 154 mg/dL — ABNORMAL HIGH (ref 65–99)

## 2015-08-29 LAB — VANCOMYCIN, TROUGH: VANCOMYCIN TR: 17 ug/mL (ref 10–20)

## 2015-08-29 MED ORDER — DEXTROSE 5 % IV SOLN: 250.0000 mg | INTRAVENOUS | Status: DC

## 2015-08-29 MED ORDER — OXYCODONE-ACETAMINOPHEN 5-325 MG PO TABS
1.0000 | ORAL_TABLET | Freq: Four times a day (QID) | ORAL | Status: DC | PRN
  Administered 2015-08-30: 1 via ORAL
  Filled 2015-08-29: qty 1

## 2015-08-29 MED ORDER — HYDRALAZINE HCL 20 MG/ML IJ SOLN
10.0000 mg | Freq: Four times a day (QID) | INTRAMUSCULAR | Status: DC | PRN
  Administered 2015-08-29: 10 mg via INTRAVENOUS
  Filled 2015-08-29: qty 1

## 2015-08-29 NOTE — Progress Notes (Signed)
Patient c/o chest pain with cough and deep breath. md on the floor and made aware, also made aware patient has had a lung profusion scan which was negative for pe. Vss, heart rate 110's. Per md will assess patient and place orders as needed

## 2015-08-29 NOTE — Progress Notes (Signed)
Spoke with dr. Elisabeth Pigeonvachhani to make aware bp is elevated sbp > 170. Per md order hydralazine 10mg  q6 hrs prn, give for sbp >160

## 2015-08-29 NOTE — Progress Notes (Signed)
[facility]  ROUNDING NOTE   Subjective:   NS @ 3m/hr Creatinine 4.6 (3.75) UOP 960  Objective:  Vital signs in last 24 hours:  Temp:  [97.8 F (36.6 C)-98.2 F (36.8 C)] 97.9 F (36.6 C) (01/07 1130) Pulse Rate:  [102-110] 105 (01/07 1130) Resp:  [21-26] 21 (01/07 1130) BP: (131-175)/(59-92) 146/73 mmHg (01/07 1130) SpO2:  [93 %-95 %] 95 % (01/07 1130)  Weight change:  Filed Weights   08/24/15 1038 08/24/15 1547  Weight: 83.915 kg (185 lb) 80.967 kg (178 lb 8 oz)    Intake/Output: I/O last 3 completed shifts: In: 2636 [P.O.:480; I.V.:2106; IV Piggyback:50] Out: 15374[Urine:1450]   Intake/Output this shift:  Total I/O In: 360 [P.O.:360] Out: 0   Physical Exam: General: NAD  Head: Normocephalic, atraumatic. Moist oral mucosal membranes  Eyes: Anicteric, PERRL  Neck: Supple, trachea midline  Lungs:  Clear to auscultation  Heart: Regular rate and rhythm  Abdomen:  Soft, nontender,   Extremities: no peripheral edema.  Neurologic: Nonfocal, moving all four extremities  Skin: No lesions       Basic Metabolic Panel:  Recent Labs Lab 08/24/15 1144 08/25/15 0412 08/26/15 0438 08/27/15 0520 08/28/15 0528  NA 137 137 136 137 139  K 4.6 4.4 4.4 4.7 4.8  CL 104 105 102 105 107  CO2 '26 22 25 25 24  '$ GLUCOSE 177* 203* 227* 170* 165*  BUN 36* 37* 43* 52* 69*  CREATININE 2.25* 2.14* 2.70* 3.75* 4.60*  CALCIUM 9.0 8.5* 8.3* 8.2* 8.4*    Liver Function Tests:  Recent Labs Lab 08/24/15 1144  AST 23  ALT 10*  ALKPHOS 62  BILITOT 0.7  PROT 7.5  ALBUMIN 3.7   No results for input(s): LIPASE, AMYLASE in the last 168 hours. No results for input(s): AMMONIA in the last 168 hours.  CBC:  Recent Labs Lab 08/24/15 1144 08/25/15 0412 08/26/15 0908 08/27/15 0520 08/28/15 0528  WBC 10.4 8.1 5.3 5.2 6.5  HGB 12.1* 10.6* 10.4* 10.1* 10.2*  HCT 36.5* 31.5* 31.3* 29.8* 30.9*  MCV 93.8 92.5 92.6 94.1 93.7  PLT 296 253 271 251 281     Cardiac Enzymes:  Recent Labs Lab 08/24/15 1144 08/24/15 1725 08/24/15 2242 08/25/15 0412  TROPONINI 0.18* 0.17* 0.17* 0.15*    BNP: Invalid input(s): POCBNP  CBG:  Recent Labs Lab 08/28/15 1304 08/28/15 1635 08/28/15 2119 08/29/15 0746 08/29/15 1128  GLUCAP 188* 157* 181* 176* 185*    Microbiology: Results for orders placed or performed during the hospital encounter of 08/24/15  CULTURE, BLOOD (ROUTINE X 2) w Reflex to PCR ID Panel     Status: None (Preliminary result)   Collection Time: 08/26/15  1:47 PM  Result Value Ref Range Status   Specimen Description BLOOD RIGHT HAND  Final   Special Requests   Final    BOTTLES DRAWN AEROBIC AND ANAEROBIC 2CCAEROB,2CCANA   Culture NO GROWTH 3 DAYS  Final   Report Status PENDING  Incomplete  CULTURE, BLOOD (ROUTINE X 2) w Reflex to PCR ID Panel     Status: None (Preliminary result)   Collection Time: 08/26/15  1:56 PM  Result Value Ref Range Status   Specimen Description BLOOD LEFT HAND  Final   Special Requests   Final    BOTTLES DRAWN AEROBIC AND ANAEROBIC 2CCAEROB,1CCANA   Culture NO GROWTH 3 DAYS  Final   Report Status PENDING  Incomplete    Coagulation Studies: No results for input(s): LABPROT, INR in the  last 72 hours.  Urinalysis:  Recent Labs  08/28/15 1311  COLORURINE YELLOW*  LABSPEC 1.012  PHURINE 5.0  GLUCOSEU 50*  HGBUR 3+*  BILIRUBINUR NEGATIVE  KETONESUR NEGATIVE  PROTEINUR 100*  NITRITE NEGATIVE  LEUKOCYTESUR NEGATIVE      Imaging: US Renal  08/28/2015  CLINICAL DATA:  Acute renal failure EXAM: RENAL / URINARY TRACT ULTRASOUND COMPLETE COMPARISON:  02/22/2014 FINDINGS: Right Kidney: Length: 10.8 cm. Normal echogenicity. Mild cortical thinning probable due to atrophy stable from prior exam. No hydronephrosis. No renal calculus. Left Kidney: Length: 12.6 cm in length. Echogenicity within normal limits. No mass or hydronephrosis visualized. Bladder: Appears normal for degree of bladder  distention. IMPRESSION: No hydronephrosis or renal calculi. Unremarkable urinary bladder. Stable mild cortical thinning right kidney probable due to atrophy. Electronically Signed   By: Lahoma Crocker M.D.   On: 08/28/2015 12:10     Medications:   . sodium chloride 60 mL/hr at 08/29/15 0854   . aspirin EC  81 mg Oral Daily  . azithromycin  500 mg Oral Daily  . heparin subcutaneous  5,000 Units Subcutaneous 3 times per day  . Influenza vac split quadrivalent PF  0.5 mL Intramuscular Tomorrow-1000  . insulin aspart  0-15 Units Subcutaneous TID WC  . insulin glargine  10 Units Subcutaneous QHS  . meclizine  12.5 mg Oral TID  . metoprolol succinate  50 mg Oral Daily  . pantoprazole  40 mg Oral Daily  . pravastatin  10 mg Oral q1800  . senna-docusate  2 tablet Oral Daily  . sodium chloride  3 mL Intravenous Q12H  . tamsulosin  0.4 mg Oral Daily   sodium chloride, acetaminophen **OR** acetaminophen, hydrALAZINE, ipratropium-albuterol, oxyCODONE-acetaminophen, polyethylene glycol, sodium chloride  Assessment/ Plan:  [name] is a 82 y.o. white male diabetes mellitus type II, hypertension, hyperlipidemia, coronary artery disease, Allergic rhinitis, osteoarthritis, polymyalgia rheumatica, obstructive sleep apnea, who was admitted to [facility] on 08/24/2015 for NSTEMI (non-ST elevated myocardial infarction) ([place]) [I21.4]  1. Acute renal failure on chronic kidney disease stage III with proteinuria: baseline creatinine of 1.8, eGFR of 36 on 01/22/2015.  Acute renal failure seems to be due to ATN. Lisinopril, hypotension and vancomycin   Chronic kidney disease secondary to diabetic nephropathy Renal ultrasound reviewed - Continue IV fluids gentle - Monitor renal function, urine output, and electrolytes. No acute indication for dialysis at this time.   2. Hypertension: better controlled - holding lisinopril. Not on a diuretic - Currently on metoprolol and tamsulosin  3. Diabetes Mellitus  type II with chronic kidney disease: insulin dependent - holding metformin. Do not recommend restarting this medication - hemoglobin A1c of 7.6% on admission, not at goal.   4. Anemia of chronic kidney disease: hemoglobin of 10.2. Normocytic    LOS: [place], [place] 1/7/20174:23 PM

## 2015-08-29 NOTE — Progress Notes (Signed)
B/P 175/79; 107  Retaken and 170/92, 110 left arm lying, Dr. Desiree HaneVachani paged awaiting call back

## 2015-08-29 NOTE — Progress Notes (Signed)
[facility] - [place] at [facility]   PATIENT NAME: Kenneth [name]    MR#:  [id number]  DATE OF BIRTH:  04-25-1928  SUBJECTIVE:  CHIEF COMPLAINT:   Chief Complaint  Patient presents with  . Chest Pain   Having complained of some chest pain today, which is on the right side and in the center, constant and worse with coughing or deep breath.  REVIEW OF SYSTEMS:  Review of Systems  Constitutional: Negative for fever and chills.  HENT: Negative for ear pain, nosebleeds and tinnitus.   Eyes: Negative for blurred vision.  Respiratory: Positive for cough and shortness of breath. Negative for wheezing.   Cardiovascular: Positive for chest pain. Negative for palpitations and leg swelling.  Gastrointestinal: Negative for nausea, vomiting, abdominal pain, diarrhea and constipation.  Genitourinary: Negative for dysuria, urgency and frequency.  Musculoskeletal: Negative for myalgias, back pain and neck pain.  Neurological: Positive for dizziness and weakness. Negative for tingling, tremors, sensory change, focal weakness, seizures and headaches.    DRUG ALLERGIES:   Allergies  Allergen Reactions  . Beef-Derived Products Hives  . Iodine Hives  . Ivp Dye [Iodinated Diagnostic Agents] Hives    VITALS:  Blood pressure 146/73, pulse 105, temperature 97.9 F (36.6 C), temperature source Oral, resp. rate 21, height 5\' 6"  (1.676 m), weight 80.967 kg (178 lb 8 oz), SpO2 95 %.  PHYSICAL EXAMINATION:  Physical Exam  GENERAL:  82 y.o.-year-old patient lying in the bed in no distress  EYES: Pupils equal, round, reactive to light and accommodation. No scleral icterus. Extraocular muscles intact.  HEENT: Head atraumatic, normocephalic. Oropharynx and nasopharynx clear.  NECK:  Supple, no jugular venous distention. No thyroid enlargement, no tenderness.  LUNGS: Normal breath sounds bilaterally, no wheezing, rales,rhonchi or crepitation. Minimal use of accessory muscles on  exertion. Decreased basilar breath sounds. Scattered rhonchi heard at the bases posteriorly CARDIOVASCULAR: S1, S2 normal. No rubs, or gallops. 3/6 systolic murmur is present ABDOMEN: Soft, nontender, nondistended. Bowel sounds present. No organomegaly or mass.  EXTREMITIES: No pedal edema, cyanosis, or clubbing.  NEUROLOGIC: Cranial nerves II through XII are intact. Muscle strength 4/5 in all extremities. Generalized weakness is present Sensation intact. Gait not checked.  PSYCHIATRIC: The patient is alert and oriented x 3.  SKIN: No obvious rash, lesion, or ulcer.    LABORATORY PANEL:   CBC  Recent Labs Lab 08/28/15 0528  WBC 6.5  HGB 10.2*  HCT 30.9*  PLT 281   ------------------------------------------------------------------------------------------------------------------  Chemistries   Recent Labs Lab 08/24/15 1144  08/28/15 0528  NA 137  < > 139  K 4.6  < > 4.8  CL 104  < > 107  CO2 26  < > 24  GLUCOSE 177*  < > 165*  BUN 36*  < > 69*  CREATININE 2.25*  < > 4.60*  CALCIUM 9.0  < > 8.4*  AST 23  --   --   ALT 10*  --   --   ALKPHOS 62  --   --   BILITOT 0.7  --   --   < > = values in this interval not displayed. ------------------------------------------------------------------------------------------------------------------  Cardiac Enzymes  Recent Labs Lab 08/25/15 0412  TROPONINI 0.15*   ------------------------------------------------------------------------------------------------------------------  RADIOLOGY:  US Renal  08/28/2015  CLINICAL DATA:  Acute renal failure EXAM: RENAL / URINARY TRACT ULTRASOUND COMPLETE COMPARISON:  02/22/2014 FINDINGS: Right Kidney: Length: 10.8 cm. Normal echogenicity. Mild cortical thinning probable due to atrophy stable from prior  exam. No hydronephrosis. No renal calculus. Left Kidney: Length: 12.6 cm in length. Echogenicity within normal limits. No mass or hydronephrosis visualized. Bladder: Appears normal for degree  of bladder distention. IMPRESSION: No hydronephrosis or renal calculi. Unremarkable urinary bladder. Stable mild cortical thinning right kidney probable due to atrophy. Electronically Signed   By: Natasha MeadLiviu  Pop M.D.   On: 08/28/2015 12:10    EKG:   Orders placed or performed during the hospital encounter of 08/24/15  . ED EKG  . ED EKG    ASSESSMENT AND PLAN:   Kenneth [name] is a 82 y.o. male with a known history of diabetes, hypertension, coronary disease status post CABG in the past comes to the emergency room with complaints of chest pain nonradiating with some nausea.  1. Elevated troponin with history of coronary artery disease status post CABG in the remote past. Likely demand ischemia. Troponins are stable.  Appreciate Cardiology consultation Continue cardiac medications.  2. Altered mental status-improved  Likely metabolic encephalopathy now resolved -Fever on 1/4, isolated, has not recurred. Blood cultures NTD. Possible bronchitis. Chest x-ray clear. Patient has cough and some shortness of breath. Empirically on Vanco and Zosyn, changed to azithromycin. VQ scan negative for PE will repeat X ray tomorrow.  3. Dizziness-subacute.  improved  Seen by ENT as an outpatient. Started on meclizine with minimal help. -Discontinue Valium due to mental status changes from it. -MRI of the brain and MRA of the head and neck without any acute findings -Physical therapy consult appreciated. Appreciate neurology consult. -Vestibular exercises and therapy recommended.  4. Acute on CKD-IV: Baseline creatinine around 2.,  Acute worsening , He has been receiving IV fluids. No nephrotoxins administered except vancomycin. No imaging or contrast exposure. He was hypotensive briefly. His UA is showing too numerous to count red blood cells and few WBCs but no nitrates.  He has a history of nephrolithiasis but does not have any pain. Renal ultrasound today showing no calculi or hydronephrosis.  Nephrology consultation is pending.  5. Hypertension- hold lisinopril for now given renal failure  6. Type 2 diabetes- sugars are elevated -continue glipizide. -Hold metformin given his CKD. On SSI -Added Lantus   7. DVT Prophylaxis- on subcutaneous heparin  8. Chest pain   Likely pleuritic due to bronchitis.   VQ scan and Xray were negative.   Pain is constant - worse with deep breath and cough.   Will give percocet for pain and follow xray tomorrow.  All the records are reviewed and case discussed with Care Management/Social Workerr. Management plans discussed with the patient, family and they are in agreement. Discussed care plan with his daughter at the bedside today  CODE STATUS: Full code  TOTAL CRITICAL CARE TIME spent in TAKING CARE OF THIS PATIENT: 40 minutes.   POSSIBLE D/C IN 1-2 DAYS, DEPENDING ON CLINICAL CONDITION.  Need SNF placement.   Altamese DillingVACHHANI, Kahlia Lagunes M.D on 08/29/2015 at 12:40 PM  Between 7am to 6pm - Pager - [phone]  After 6pm go to [email or web address] - password [email or web address] [facility]  [facility]  Office  [phone]  CC: Primary care physician; Barbette ReichmannHANDE,VISHWANATH, MD

## 2015-08-30 ENCOUNTER — Inpatient Hospital Stay: Payer: Medicare Other

## 2015-08-30 LAB — CBC
HCT: 30.2 % — ABNORMAL LOW (ref 40.0–52.0)
Hemoglobin: 10 g/dL — ABNORMAL LOW (ref 13.0–18.0)
MCH: 31.5 pg (ref 26.0–34.0)
MCHC: 33.2 g/dL (ref 32.0–36.0)
MCV: 94.7 fL (ref 80.0–100.0)
PLATELETS: 400 10*3/uL (ref 150–440)
RBC: 3.19 MIL/uL — AB (ref 4.40–5.90)
RDW: 13.8 % (ref 11.5–14.5)
WBC: 10.5 10*3/uL (ref 3.8–10.6)

## 2015-08-30 LAB — GLUCOSE, CAPILLARY
GLUCOSE-CAPILLARY: 147 mg/dL — AB (ref 65–99)
Glucose-Capillary: 197 mg/dL — ABNORMAL HIGH (ref 65–99)
Glucose-Capillary: 200 mg/dL — ABNORMAL HIGH (ref 65–99)

## 2015-08-30 LAB — BASIC METABOLIC PANEL
Anion gap: 13 (ref 5–15)
BUN: 94 mg/dL — AB (ref 6–20)
CALCIUM: 8.1 mg/dL — AB (ref 8.9–10.3)
CO2: 17 mmol/L — ABNORMAL LOW (ref 22–32)
CREATININE: 6.84 mg/dL — AB (ref 0.61–1.24)
Chloride: 109 mmol/L (ref 101–111)
GFR calc Af Amer: 8 mL/min — ABNORMAL LOW (ref >60)
GFR, EST NON AFRICAN AMERICAN: 6 mL/min — AB (ref >60)
GLUCOSE: 166 mg/dL — AB (ref 65–99)
Potassium: 4.7 mmol/L (ref 3.5–5.1)
SODIUM: 139 mmol/L (ref 135–145)

## 2015-08-30 MED ORDER — SODIUM BICARBONATE 8.4 % IV SOLN
INTRAVENOUS | Status: DC
  Administered 2015-08-30 – 2015-08-31 (×2): via INTRAVENOUS
  Filled 2015-08-30 (×4): qty 150

## 2015-08-30 NOTE — Progress Notes (Signed)
[facility]  ROUNDING NOTE   Subjective:   NS @ 64m/hr Creatinine 6.84 (4.6) (3.75) CO2 17 UOP 850 (960)  Objective:  Vital signs in last 24 hours:  Temp:  [98.1 F (36.7 C)-98.6 F (37 C)] 98.6 F (37 C) (01/08 1139) Pulse Rate:  [99-104] 99 (01/08 1139) Resp:  [20-24] 20 (01/08 1139) BP: (130-157)/(61-73) 130/64 mmHg (01/08 1139) SpO2:  [92 %-95 %] 93 % (01/08 1139)  Weight change:  Filed Weights   08/24/15 1038 08/24/15 1547  Weight: 83.915 kg (185 lb) 80.967 kg (178 lb 8 oz)    Intake/Output: I/O last 3 completed shifts: In: 2801 [P.O.:600; I.V.:2201] Out: 1500 [Urine:1500]   Intake/Output this shift:  Total I/O In: 240 [P.O.:240] Out: -   Physical Exam: General: NAD  Head: Normocephalic, atraumatic. Moist oral mucosal membranes  Eyes: Anicteric, PERRL  Neck: Supple, trachea midline  Lungs:  Clear to auscultation  Heart: Regular rate and rhythm  Abdomen:  Soft, nontender,   Extremities: no peripheral edema.  Neurologic: Nonfocal, moving all four extremities  Skin: No lesions       Basic Metabolic Panel:  Recent Labs Lab 08/25/15 0412 08/26/15 0438 08/27/15 0520 08/28/15 0528 08/30/15 0427  NA 137 136 137 139 139  K 4.4 4.4 4.7 4.8 4.7  CL 105 102 105 107 109  CO2 _0 17*  GLUCOSE 203* 227* 170* 165* 166*  BUN 37* 43* 52* 69* 94*  CREATININE 2.14* 2.70* 3.75* 4.60* 6.84*  CALCIUM 8.5* 8.3* 8.2* 8.4* 8.1*    Liver Function Tests:  Recent Labs Lab 08/24/15 1144  AST 23  ALT 10*  ALKPHOS 62  BILITOT 0.7  PROT 7.5  ALBUMIN 3.7   No results for input(s): LIPASE, AMYLASE in the last 168 hours. No results for input(s): AMMONIA in the last 168 hours.  CBC:  Recent Labs Lab 08/25/15 0412 08/26/15 0908 08/27/15 0520 08/28/15 0528 08/30/15 0427  WBC 8.1 5.3 5.2 6.5 10.5  HGB 10.6* 10.4* 10.1* 10.2* 10.0*  HCT 31.5* 31.3* 29.8* 30.9* 30.2*  MCV 92.5 92.6 94.1 93.7 94.7  PLT 253 271 251 281 400     Cardiac Enzymes:  Recent Labs Lab 08/24/15 1144 08/24/15 1725 08/24/15 2242 08/25/15 0412  TROPONINI 0.18* 0.17* 0.17* 0.15*    BNP: Invalid input(s): POCBNP  CBG:  Recent Labs Lab 08/29/15 0746 08/29/15 1128 08/29/15 1628 08/30/15 0748 08/30/15 1137  GLUCAP 176* 185* 154* 147* 197*    Microbiology: Results for orders placed or performed during the hospital encounter of 08/24/15  CULTURE, BLOOD (ROUTINE X 2) w Reflex to PCR ID Panel     Status: None (Preliminary result)   Collection Time: 08/26/15  1:47 PM  Result Value Ref Range Status   Specimen Description BLOOD RIGHT HAND  Final   Special Requests   Final    BOTTLES DRAWN AEROBIC AND ANAEROBIC 2CCAEROB,2CCANA   Culture NO GROWTH 4 DAYS  Final   Report Status PENDING  Incomplete  CULTURE, BLOOD (ROUTINE X 2) w Reflex to PCR ID Panel     Status: None (Preliminary result)   Collection Time: 08/26/15  1:56 PM  Result Value Ref Range Status   Specimen Description BLOOD LEFT HAND  Final   Special Requests   Final    BOTTLES DRAWN AEROBIC AND ANAEROBIC 2CCAEROB,1CCANA   Culture NO GROWTH 4 DAYS  Final   Report Status PENDING  Incomplete    Coagulation Studies: No results for input(s): LABPROT, INR  in the last 72 hours.  Urinalysis:  Recent Labs  08/28/15 1311  COLORURINE YELLOW*  LABSPEC 1.012  PHURINE 5.0  GLUCOSEU 50*  HGBUR 3+*  BILIRUBINUR NEGATIVE  KETONESUR NEGATIVE  PROTEINUR 100*  NITRITE NEGATIVE  LEUKOCYTESUR NEGATIVE      Imaging: Dg Chest 2 View  08/30/2015  CLINICAL DATA:  Continued chest pain and weakness. Follow-up bronchitis. EXAM: CHEST  2 VIEW COMPARISON:  08/26/2015 FINDINGS: Median sternotomy and postsurgical changes from CABG are stable. Cardiomediastinal silhouette is stably enlarged. Mediastinal contours appear intact. There is no evidence of focal airspace consolidation, pleural effusion or pneumothorax. Osseous structures are without acute abnormality. Soft tissues are  grossly normal. IMPRESSION: Stably enlarged cardiac silhouette without evidence of acute cardiopulmonary process. Electronically Signed   By: Fidela Salisbury M.D.   On: 08/30/2015 10:10     Medications:   .  sodium bicarbonate  infusion 1000 mL     . aspirin EC  81 mg Oral Daily  . azithromycin  500 mg Oral Daily  . heparin subcutaneous  5,000 Units Subcutaneous 3 times per day  . Influenza vac split quadrivalent PF  0.5 mL Intramuscular Tomorrow-1000  . insulin aspart  0-15 Units Subcutaneous TID WC  . insulin glargine  10 Units Subcutaneous QHS  . meclizine  12.5 mg Oral TID  . metoprolol succinate  50 mg Oral Daily  . pantoprazole  40 mg Oral Daily  . pravastatin  10 mg Oral q1800  . senna-docusate  2 tablet Oral Daily  . sodium chloride  3 mL Intravenous Q12H  . tamsulosin  0.4 mg Oral Daily   sodium chloride, acetaminophen **OR** acetaminophen, hydrALAZINE, ipratropium-albuterol, oxyCODONE-acetaminophen, polyethylene glycol, sodium chloride  Assessment/ Plan:  [name] is a 82 y.o. white male diabetes mellitus type II, hypertension, hyperlipidemia, coronary artery disease, Allergic rhinitis, osteoarthritis, polymyalgia rheumatica, obstructive sleep apnea, who was admitted to [facility] on 08/24/2015 for NSTEMI (non-ST elevated myocardial infarction) ([place]) [I21.4]  1. Acute renal failure with metabolic acidosis on chronic kidney disease stage III with proteinuria: baseline creatinine of 1.8, eGFR of 36 on 01/22/2015.  Acute renal failure seems to be due to ATN. Lisinopril, hypotension and vancomycin   Chronic kidney disease secondary to diabetic nephropathy Renal function is worsening.  - Continue IV fluids: NS may be exacerbating metabolic acidosis. Change to sodium bicarb gtt.  - Monitor renal function, urine output, and electrolytes. No acute indication for dialysis at this time.   2. Hypertension: better controlled - holding lisinopril. Not on a diuretic -  Currently on metoprolol and tamsulosin  3. Diabetes Mellitus type II with chronic kidney disease: insulin dependent - holding metformin. Do not recommend restarting this medication - hemoglobin A1c of 7.6% on admission, not at goal.   4. Anemia of chronic kidney disease: hemoglobin of 10. Normocytic  - has not received epo.    LOS: Kenneth [name], Kenneth [name] 1/8/201712:45 PM

## 2015-08-30 NOTE — Progress Notes (Signed)
[facility] - [place] at [facility]   PATIENT NAME: Winferd Wease    MR#:  [id number]  DATE OF BIRTH:  1928-04-17  SUBJECTIVE:  CHIEF COMPLAINT:   Chief Complaint  Patient presents with  . Chest Pain   Having complained of some chest pain today, which is on the right side and in the center, constant and worse with coughing or deep breath. Kidney func is worse today.  REVIEW OF SYSTEMS:  Review of Systems  Constitutional: Negative for fever and chills.  HENT: Negative for ear pain, nosebleeds and tinnitus.   Eyes: Negative for blurred vision.  Respiratory: Positive for cough and shortness of breath. Negative for wheezing.   Cardiovascular: Positive for chest pain. Negative for palpitations and leg swelling.  Gastrointestinal: Negative for nausea, vomiting, abdominal pain, diarrhea and constipation.  Genitourinary: Negative for dysuria, urgency and frequency.  Musculoskeletal: Negative for myalgias, back pain and neck pain.  Neurological: Positive for dizziness and weakness. Negative for tingling, tremors, sensory change, focal weakness, seizures and headaches.    DRUG ALLERGIES:   Allergies  Allergen Reactions  . Beef-Derived Products Hives  . Iodine Hives  . Ivp Dye [Iodinated Diagnostic Agents] Hives    VITALS:  Blood pressure 147/63, pulse 102, temperature 97.5 F (36.4 C), temperature source Oral, resp. rate 24, height 5\' 6"  (1.676 m), weight 80.967 kg (178 lb 8 oz), SpO2 96 %.  PHYSICAL EXAMINATION:  Physical Exam  GENERAL:  82 y.o.-year-old patient lying in the bed in no distress  EYES: Pupils equal, round, reactive to light and accommodation. No scleral icterus. Extraocular muscles intact.  HEENT: Head atraumatic, normocephalic. Oropharynx and nasopharynx clear.  NECK:  Supple, no jugular venous distention. No thyroid enlargement, no tenderness.  LUNGS: Normal breath sounds bilaterally, no wheezing, rales,rhonchi or crepitation. Minimal  use of accessory muscles on exertion. Decreased basilar breath sounds. Scattered rhonchi heard at the bases posteriorly CARDIOVASCULAR: S1, S2 normal. No rubs, or gallops. 3/6 systolic murmur is present ABDOMEN: Soft, nontender, nondistended. Bowel sounds present. No organomegaly or mass.  EXTREMITIES: No pedal edema, cyanosis, or clubbing.  NEUROLOGIC: Cranial nerves II through XII are intact. Muscle strength 4/5 in all extremities. Generalized weakness is present Sensation intact. Gait not checked.  PSYCHIATRIC: The patient is alert and oriented x 3.  SKIN: No obvious rash, lesion, or ulcer.    LABORATORY PANEL:   CBC  Recent Labs Lab 08/30/15 0427  WBC 10.5  HGB 10.0*  HCT 30.2*  PLT 400   ------------------------------------------------------------------------------------------------------------------  Chemistries   Recent Labs Lab 08/24/15 1144  08/30/15 0427  NA 137  < > 139  K 4.6  < > 4.7  CL 104  < > 109  CO2 26  < > 17*  GLUCOSE 177*  < > 166*  BUN 36*  < > 94*  CREATININE 2.25*  < > 6.84*  CALCIUM 9.0  < > 8.1*  AST 23  --   --   ALT 10*  --   --   ALKPHOS 62  --   --   BILITOT 0.7  --   --   < > = values in this interval not displayed. ------------------------------------------------------------------------------------------------------------------  Cardiac Enzymes  Recent Labs Lab 08/25/15 0412  TROPONINI 0.15*   ------------------------------------------------------------------------------------------------------------------  RADIOLOGY:  Dg Chest 2 View  08/30/2015  CLINICAL DATA:  Continued chest pain and weakness. Follow-up bronchitis. EXAM: CHEST  2 VIEW COMPARISON:  08/26/2015 FINDINGS: Median sternotomy and postsurgical changes from CABG are  stable. Cardiomediastinal silhouette is stably enlarged. Mediastinal contours appear intact. There is no evidence of focal airspace consolidation, pleural effusion or pneumothorax. Osseous structures are  without acute abnormality. Soft tissues are grossly normal. IMPRESSION: Stably enlarged cardiac silhouette without evidence of acute cardiopulmonary process. Electronically Signed   By: Ted Mcalpineobrinka  Dimitrova M.D.   On: 08/30/2015 10:10    EKG:   Orders placed or performed during the hospital encounter of 08/24/15  . ED EKG  . ED EKG    ASSESSMENT AND PLAN:   Arlington CalixHollan Aponte is a 82 y.o. male with a known history of diabetes, hypertension, coronary disease status post CABG in the past comes to the emergency room with complaints of chest pain nonradiating with some nausea.  1. Elevated troponin with history of coronary artery disease status post CABG in the remote past. Likely demand ischemia. Troponins are stable.  Appreciate Cardiology consultation Continue cardiac medications.  2. Altered mental status-improved  Likely metabolic encephalopathy now resolved -Fever on 1/4, isolated, has not recurred. Blood cultures NTD. Possible bronchitis. Chest x-ray clear. Patient has cough and some shortness of breath. Empirically on Vanco and Zosyn, changed to azithromycin. VQ scan negative for PE Repeat X ray without edema or infiltrate.  3. Dizziness-subacute.  improved  Seen by ENT as an outpatient. Started on meclizine with minimal help. -Discontinue Valium due to mental status changes from it. -MRI of the brain and MRA of the head and neck without any acute findings -Physical therapy consult appreciated. Appreciate neurology consult. -Vestibular exercises and therapy recommended.   Need SNF rehab.  4. 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 No nephrotoxins administered except vancomycin. No imaging or contrast exposure. He was hypotensive briefly. His UA is showing too numerous to count red blood cells and few WBCs but no nitrates.  He has a history of nephrolithiasis but does not have any pain. Renal ultrasound  showing no calculi or  hydronephrosis. Nephrology consultation appreciated. Gradual worsening renal function. Still have satisfactory urine output.  5. Hypertension- hold lisinopril for now given renal failure  6. Type 2 diabetes- sugars are elevated -continue glipizide. -Hold metformin given his CKD. On SSI -Added Lantus   7. DVT Prophylaxis- on subcutaneous heparin  8. Chest pain   Likely pleuritic due to bronchitis.   VQ scan and Xray were negative.   Pain is constant - worse with deep breath and cough.   Will give percocet for pain and follow xray tomorrow.  All the records are reviewed and case discussed with Care Management/Social Workerr. Management plans discussed with the patient, family and they are in agreement. Discussed care plan with his daughter at the bedside today  CODE STATUS: Full code  TOTAL CRITICAL CARE TIME spent in TAKING CARE OF THIS PATIENT: 40 minutes.   POSSIBLE D/C IN 1-2 DAYS, DEPENDING ON CLINICAL CONDITION.  Need SNF placement.  spoke to his daughter and updated her.   Altamese DillingVACHHANI, Nikaya Nasby M.D on 08/30/2015 at 10:20 PM  Between 7am to 6pm - Pager - [phone]  After 6pm go to [email or web address] - password [email or web address] [facility]  [facility]  Office  [phone]  CC: Primary care physician; Barbette ReichmannHANDE,VISHWANATH, MD

## 2015-08-30 NOTE — Progress Notes (Signed)
Pt doesn't wish to wear CPAP. 

## 2015-08-31 LAB — CBC
HCT: 30.6 % — ABNORMAL LOW (ref 40.0–52.0)
HEMOGLOBIN: 10.3 g/dL — AB (ref 13.0–18.0)
MCH: 31.6 pg (ref 26.0–34.0)
MCHC: 33.5 g/dL (ref 32.0–36.0)
MCV: 94.3 fL (ref 80.0–100.0)
Platelets: 491 10*3/uL — ABNORMAL HIGH (ref 150–440)
RBC: 3.25 MIL/uL — ABNORMAL LOW (ref 4.40–5.90)
RDW: 13.8 % (ref 11.5–14.5)
WBC: 10.4 10*3/uL (ref 3.8–10.6)

## 2015-08-31 LAB — RENAL FUNCTION PANEL
ANION GAP: 9 (ref 5–15)
Albumin: 2.3 g/dL — ABNORMAL LOW (ref 3.5–5.0)
BUN: 99 mg/dL — ABNORMAL HIGH (ref 6–20)
CALCIUM: 8 mg/dL — AB (ref 8.9–10.3)
CO2: 25 mmol/L (ref 22–32)
CREATININE: 7.46 mg/dL — AB (ref 0.61–1.24)
Chloride: 106 mmol/L (ref 101–111)
GFR calc Af Amer: 7 mL/min — ABNORMAL LOW (ref >60)
GFR calc non Af Amer: 6 mL/min — ABNORMAL LOW (ref >60)
GLUCOSE: 212 mg/dL — AB (ref 65–99)
Phosphorus: 4.7 mg/dL — ABNORMAL HIGH (ref 2.5–4.6)
Potassium: 4.6 mmol/L (ref 3.5–5.1)
SODIUM: 140 mmol/L (ref 135–145)

## 2015-08-31 LAB — PROTEIN / CREATININE RATIO, URINE
CREATININE, URINE: 69 mg/dL
PROTEIN CREATININE RATIO: 1.87 mg/mg{creat} — AB (ref 0.00–0.15)
Total Protein, Urine: 129 mg/dL

## 2015-08-31 LAB — GLUCOSE, CAPILLARY
GLUCOSE-CAPILLARY: 137 mg/dL — AB (ref 65–99)
GLUCOSE-CAPILLARY: 110 mg/dL — AB (ref 65–99)
Glucose-Capillary: 196 mg/dL — ABNORMAL HIGH (ref 65–99)
Glucose-Capillary: 162 mg/dL — ABNORMAL HIGH (ref 65–99)

## 2015-08-31 LAB — CULTURE, BLOOD (ROUTINE X 2)
CULTURE: NO GROWTH
CULTURE: NO GROWTH

## 2015-08-31 MED ORDER — CEFAZOLIN SODIUM 1-5 GM-% IV SOLN
1.0000 g | INTRAVENOUS | Status: AC
  Administered 2015-09-01: 1 g via INTRAVENOUS
  Filled 2015-08-31: qty 50

## 2015-08-31 NOTE — Progress Notes (Signed)
Consent for perm cath placement signed by daughter and placed in chart. Education given to and discussed with daughter at bedside.

## 2015-08-31 NOTE — Care Management Important Message (Signed)
Important Message  Patient Details  Name: Kenneth [name] MRN: [id number] Date of Birth: May 30, 1928   Medicare Important Message Given:  Yes    Olegario MessierKathy A Vegas Coffin 08/31/2015, 9:45 AM

## 2015-08-31 NOTE — Progress Notes (Signed)
SATURATION QUALIFICATIONS: (This note is used to comply with regulatory documentation for home oxygen)  Patient Saturations on Room Air at Rest = 86%  Patient Saturations on Room Air while Ambulating = n/a%  Patient Saturations on n/a Liters of oxygen while Ambulating = n/a%  Please briefly explain why patient needs home oxygen: 

## 2015-08-31 NOTE — Consult Note (Signed)
[facility] Vascular Consult Note  MRN : [id number]  Kenneth [name] is a 82 y.o. (Mar 14, 1928) male who presents with chief complaint of  Chief Complaint  Patient presents with  . Chest Pain  .  History of Present Illness: the patient is a 82 y.o. male with multiple comorbidities including diabetes mellitus type II, hypertension, hyperlipidemia, coronary artery disease, Allergic rhinitis, osteoarthritis, polymyalgia rheumatica, obstructive sleep apnea, who was admitted to [facility] on 08/24/2015 for NSTEMI (non-ST elevated myocardial infarction)  Patient's baseline creatinine in the past was documented as 1.8 with eGFR of 36. However, on this admission his creatinine has trended to 4.6 and nephrology has been called. And upon their evaluation the patient is to start hemodialysis.  Patient is unable to give much of a history as he is very lethargic.  Hypotensive on 1/5. However creatinine rising before then. No contrast exposure.   Current Facility-Administered Medications  Medication Dose Route Frequency Provider Last Rate Last Dose  . 0.9 %  sodium chloride infusion  250 mL Intravenous PRN Fritzi Mandes, MD      . acetaminophen (TYLENOL) tablet 650 mg  650 mg Oral Q6H PRN Fritzi Mandes, MD   650 mg at 08/26/15 2044   Or  . acetaminophen (TYLENOL) suppository 650 mg  650 mg Rectal Q6H PRN Fritzi Mandes, MD      . aspirin EC tablet 81 mg  81 mg Oral Daily Erma Heritage, [place]   81 mg at 08/31/15 6579  . azithromycin (ZITHROMAX) tablet 500 mg  500 mg Oral Daily Aldean Jewett, MD   500 mg at 08/31/15 0909  . [START ON 09/01/2015] ceFAZolin (ANCEF) IVPB 1 g/50 mL premix  1 g Intravenous On Call Katha Cabal, MD      . heparin injection 5,000 Units  5,000 Units Subcutaneous 3 times per day Gladstone Lighter, MD   5,000 Units at 08/31/15 1401  . hydrALAZINE (APRESOLINE) injection 10 mg  10 mg Intravenous Q6H PRN Vaughan Basta, MD   10 mg at 08/29/15 0920  .  Influenza vac split quadrivalent PF (FLUARIX) injection 0.5 mL  0.5 mL Intramuscular Tomorrow-1000 Fritzi Mandes, MD   0.5 mL at 08/25/15 1802  . insulin aspart (novoLOG) injection 0-15 Units  0-15 Units Subcutaneous TID WC Fritzi Mandes, MD   2 Units at 08/31/15 1232  . insulin glargine (LANTUS) injection 10 Units  10 Units Subcutaneous QHS Gladstone Lighter, MD   10 Units at 08/30/15 2103  . ipratropium-albuterol (DUONEB) 0.5-2.5 (3) MG/3ML nebulizer solution 3 mL  3 mL Nebulization Q4H PRN Gladstone Lighter, MD   3 mL at 08/31/15 0312  . meclizine (ANTIVERT) tablet 12.5 mg  12.5 mg Oral TID Alexis Goodell, MD   12.5 mg at 08/31/15 1711  . metoprolol succinate (TOPROL-XL) 24 hr tablet 50 mg  50 mg Oral Daily Fritzi Mandes, MD   50 mg at 08/31/15 0909  . oxyCODONE-acetaminophen (PERCOCET/ROXICET) 5-325 MG per tablet 1 tablet  1 tablet Oral Q6H PRN Vaughan Basta, MD   1 tablet at 08/30/15 1050  . pantoprazole (PROTONIX) EC tablet 40 mg  40 mg Oral Daily Fritzi Mandes, MD   40 mg at 08/31/15 0909  . polyethylene glycol (MIRALAX / GLYCOLAX) packet 17 g  17 g Oral Daily PRN Gladstone Lighter, MD      . pravastatin (PRAVACHOL) tablet 10 mg  10 mg Oral q1800 Fritzi Mandes, MD   10 mg at 08/31/15 1711  . senna-docusate (Senokot-S)  tablet 2 tablet  2 tablet Oral Daily Gladstone Lighter, MD   2 tablet at 08/31/15 0909  . sodium chloride 0.9 % injection 3 mL  3 mL Intravenous Q12H Fritzi Mandes, MD   3 mL at 08/31/15 0910  . sodium chloride 0.9 % injection 3 mL  3 mL Intravenous PRN Fritzi Mandes, MD      . tamsulosin (FLOMAX) capsule 0.4 mg  0.4 mg Oral Daily Aldean Jewett, MD   0.4 mg at 08/31/15 2458    Past Medical History  Diagnosis Date  . Diabetes mellitus   . Hyperlipidemia   . Hypertension   . Coronary artery disease     Lexiscan myoview (5/10) showed EF 45%, hypokinetic apex and lateral wall, fixed perfusion defect involving apex, inferoapex, and inferolateral wall, also some inferoapical ischemia;  LHC (5/10) showed patent LIMA-LAD and totally occluded SVG-OM1, 99% pLAD, 40% ostial CFX, 40% mCFX, 30% OM1, 20% pRCA  . History of MRI of chest 11/10    Cardiac; EF 48%, mid anterolateral and inferolateral walls, apical lateral wall, apical septal wall, and true apex were all think and akinetic; 51-75% thickness subendocardial delayed enhancement in mid anterolateral and inferolateral walls, full thickness enhancement in apical laterla and apical septal walls and true apex  . Nephrolithiasis   . Spinal stenosis   . B12 deficiency   . Obesity   . OSA (obstructive sleep apnea)     Moderate    Past Surgical History  Procedure Laterality Date  . Back surgery      Multiple, with spinal stenosis  . Cataract extraction    . Coronary artery bypass graft  1999    In [place] with LIMA-LAD and SVG-OM1  . Total knee arthroplasty  5/10    Social History Social History  Substance Use Topics  . Smoking status: Former Research scientist (life sciences)  . Smokeless tobacco: None  . Alcohol Use: No    Family History Family History  Problem Relation Age of Onset  . Kidney failure Mother   . Heart failure Father    no family history of porphyria, autoimmune disease or bleeding clotting disorders  Allergies  Allergen Reactions  . Beef-Derived Products Hives  . Iodine Hives  . Ivp Dye [Iodinated Diagnostic Agents] Hives     REVIEW OF SYSTEMS (patient is so lethargic he is unable to respond to give review of systems)  Constitutional: []Weight loss  []Fever  []Chills Cardiac: []Chest pain   []Chest pressure   []Palpitations   []Shortness of breath when laying flat   []Shortness of breath at rest   []Shortness of breath with exertion. Vascular:  []Pain in legs with walking   []Pain in legs at rest   []Pain in legs when laying flat   []Claudication   []Pain in feet when walking  []Pain in feet at rest  []Pain in feet when laying flat   []History of DVT   []Phlebitis   []Swelling in legs   []Varicose veins    []Non-healing ulcers Pulmonary:   []Uses home oxygen   []Productive cough   []Hemoptysis   []Wheeze  []COPD   []Asthma Neurologic:  []Dizziness  []Blackouts   []Seizures   []History of stroke   []History of TIA  []Aphasia   []Temporary blindness   []Dysphagia   []Weakness or numbness in arms   []Weakness or numbness in legs Musculoskeletal:  []Arthritis   []Joint swelling   []Joint pain   []Low back pain Hematologic:  []Easy bruising  []Easy  bleeding   []Hypercoagulable state   []Anemic  []Hepatitis Gastrointestinal:  []Blood in stool   []Vomiting blood  []Gastroesophageal reflux/heartburn   []Difficulty swallowing. Genitourinary:  []Chronic kidney disease   []Difficult urination  []Frequent urination  []Burning with urination   []Blood in urine Skin:  []Rashes   []Ulcers   []Wounds Psychological:  []History of anxiety   [] History of major depression.  Physical Examination  Filed Vitals:   08/31/15 0543 08/31/15 1104 08/31/15 1550 08/31/15 1559  BP: 169/73 151/66    Pulse: 100 91    Temp: 98 F (36.7 C) 98.4 F (36.9 C)    TempSrc: Oral Oral    Resp: 16 16    Height:      Weight:      SpO2: 96% 93% 86% 94%   Body mass index is 28.82 kg/(m^2). Gen:  WD/WN, NAD Head: [place]/AT, No temporalis wasting. Prominent temp pulse not noted. Ear/Nose/Throat: Hearing grossly intact, nares w/o erythema or drainage, oropharynx w/o Erythema/Exudate Eyes: PERRLA, EOMI.  Neck: Supple, no nuchal rigidity.  No bruit or JVD.  Pulmonary:  Good air movement, clear to auscultation bilaterally.  Cardiac: RRR, normal S1, S2, no Murmurs, rubs or gallops. Vascular:  Vessel Right Left  Radial Palpable Palpable  Ulnar Palpable Palpable  Brachial Palpable Palpable  Carotid Palpable, without bruit Palpable, without bruit  Aorta Not palpable N/A  Femoral Palpable Palpable  Popliteal Palpable Palpable  PT Palpable Palpable  DP Palpable Palpable   Gastrointestinal: soft, non-tender/non-distended. No  guarding/reflex. No masses, surgical incisions, or scars. Musculoskeletal: M/S unable to evaluate.  Extremities without ischemic changes.  No deformity or atrophy. No edema. Neurologic: Not responsive Motor exam as listed above. Psychiatric: Judgment is poor as the patient is very lethargic Dermatologic: No rashes or ulcers noted.  No cellulitis or open wounds. Lymph : No Cervical, Axillary, or Inguinal lymphadenopathy.   CBC Lab Results  Component Value Date   WBC 10.4 08/31/2015   HGB 10.3* 08/31/2015   HCT 30.6* 08/31/2015   MCV 94.3 08/31/2015   PLT 491* 08/31/2015    BMET    Component Value Date/Time   NA 140 08/31/2015 0425   NA 134* 02/22/2014 1410   K 4.6 08/31/2015 0425   K 5.0 02/22/2014 1805   CL 106 08/31/2015 0425   CL 105 02/22/2014 1410   CO2 25 08/31/2015 0425   CO2 23 02/22/2014 1410   GLUCOSE 212* 08/31/2015 0425   GLUCOSE 159* 02/22/2014 1410   BUN 99* 08/31/2015 0425   BUN 31* 02/22/2014 1410   CREATININE 7.46* 08/31/2015 0425   CREATININE 3.02* 02/22/2014 1410   CALCIUM 8.0* 08/31/2015 0425   CALCIUM 8.3* 02/22/2014 1410   GFRNONAA 6* 08/31/2015 0425   GFRNONAA 18* 02/22/2014 1410   GFRAA 7* 08/31/2015 0425   GFRAA 21* 02/22/2014 1410   Estimated Creatinine Clearance: 7.2 mL/min (by C-G formula based on Cr of 7.46).  COAG Lab Results  Component Value Date   INR 1.17 08/24/2015    Radiology Dg Chest 2 View  08/30/2015  CLINICAL DATA:  Continued chest pain and weakness. Follow-up bronchitis. EXAM: CHEST  2 VIEW COMPARISON:  08/26/2015 FINDINGS: Median sternotomy and postsurgical changes from CABG are stable. Cardiomediastinal silhouette is stably enlarged. Mediastinal contours appear intact. There is no evidence of focal airspace consolidation, pleural effusion or pneumothorax. Osseous structures are without acute abnormality. Soft tissues are grossly normal. IMPRESSION: Stably enlarged cardiac silhouette without evidence of acute cardiopulmonary  process. Electronically Signed  By: Fidela Salisbury M.D.   On: 08/30/2015 10:10   Dg Chest 2 View  08/26/2015  CLINICAL DATA:  Cough, chest pain, fever EXAM: CHEST  2 VIEW COMPARISON:  08/24/2015 FINDINGS: Cardiomediastinal silhouette is stable. Status post median sternotomy. No acute infiltrate or pleural effusion. No pulmonary edema. Mild left basilar atelectasis. IMPRESSION: No infiltrate or pulmonary edema. Cardiomegaly again noted. Mild left basilar atelectasis. Electronically Signed   By: Lahoma Crocker M.D.   On: 08/26/2015 16:12   Mr Jodene Nam Head Wo Contrast  08/25/2015  CLINICAL DATA:  Ataxia EXAM: MRA HEAD WITHOUT CONTRAST TECHNIQUE: Angiographic images of the Circle of Willis were obtained using MRA technique without intravenous contrast. COMPARISON:  MR head 08/25/2015 FINDINGS: Mild to moderate stenosis distal left vertebral artery. Right vertebral artery widely patent. Basilar widely patent. Superior cerebellar and posterior cerebral arteries patent bilaterally. Internal carotid artery widely patent bilaterally without significant stenosis. Hypoplastic right A1 segment. Both anterior cerebral arteries are patent and supplied from the left. Both middle cerebral arteries patent without significant stenosis. Negative for aneurysm or vascular malformation. IMPRESSION: Mild to moderate stenosis distal left vertebral artery. Otherwise no significant intracranial stenosis. Electronically Signed   By: Franchot Gallo M.D.   On: 08/25/2015 13:58   Mr Angiogram Neck Wo Contrast  08/25/2015  CLINICAL DATA:  Ataxia EXAM: MRA NECK WITHOUT CONTRAST TECHNIQUE: Angiographic images of the neck were obtained using MRA technique without intravenous contrast. Carotid stenosis measurements (when applicable) are obtained utilizing NASCET criteria, using the distal internal carotid diameter as the denominator. COMPARISON:  MR head 08/25/2015 FINDINGS: MRA NECK FINDINGS Image quality degraded by mild motion and lack of  intravenous contrast. Carotid bifurcation widely patent bilaterally. Tortuosity of the cervical internal carotid artery bilaterally. Negative for stenosis or dissection Both vertebral arteries patent to the basilar. Mild stenosis distal left vertebral artery. IMPRESSION: No significant carotid stenosis. Mild stenosis distal left vertebral artery. Electronically Signed   By: Franchot Gallo M.D.   On: 08/25/2015 13:15   Mr Brain Wo Contrast  08/25/2015  CLINICAL DATA:  Ataxia EXAM: MRI HEAD WITHOUT CONTRAST TECHNIQUE: Multiplanar, multiecho pulse sequences of the brain and surrounding structures were obtained without intravenous contrast. COMPARISON:  CT 08/02/2011 FINDINGS: Negative for acute infarct Moderate atrophy. Moderate chronic microvascular ischemic change in the cerebral white matter. Mild chronic ischemia in the basal ganglia and pons. Chronic infarct high left frontal lobe. Negative for intracranial hemorrhage.  Negative for fluid collection Negative for mass or edema.  No shift of the midline structures. Paranasal sinuses clear.  Negative orbit.  Pituitary not enlarged. IMPRESSION: Atrophy and chronic ischemia.  No acute abnormality. Electronically Signed   By: Franchot Gallo M.D.   On: 08/25/2015 12:56   US Renal  08/28/2015  CLINICAL DATA:  Acute renal failure EXAM: RENAL / URINARY TRACT ULTRASOUND COMPLETE COMPARISON:  02/22/2014 FINDINGS: Right Kidney: Length: 10.8 cm. Normal echogenicity. Mild cortical thinning probable due to atrophy stable from prior exam. No hydronephrosis. No renal calculus. Left Kidney: Length: 12.6 cm in length. Echogenicity within normal limits. No mass or hydronephrosis visualized. Bladder: Appears normal for degree of bladder distention. IMPRESSION: No hydronephrosis or renal calculi. Unremarkable urinary bladder. Stable mild cortical thinning right kidney probable due to atrophy. Electronically Signed   By: Lahoma Crocker M.D.   On: 08/28/2015 12:10   Nm Pulmonary Perf  And Vent  08/26/2015  CLINICAL DATA:  Tachycardia, asthma, COPD, bronchitis. EXAM: NUCLEAR MEDICINE VENTILATION - PERFUSION LUNG SCAN TECHNIQUE: Ventilation images  were obtained in multiple projections using inhaled aerosol Tc-74mDTPA. Perfusion images were obtained in multiple projections after intravenous injection of Tc-970mAA. RADIOPHARMACEUTICALS:  Thirty-two Technetium-9919mPA aerosol inhalation and 4.3 Technetium-27m65m IV COMPARISON:  08/24/2015 FINDINGS: Ventilation: There multiple ventilation defects. There is overall heterogeneous ventilation. Perfusion: There is a perfusion defect within the LEFT lower lobe which matches a ventilation defect. Seen on LPO projection. IMPRESSION: No evidence of pulmonary embolism. Matched ventilation-perfusion defects related to COPD. Electronically Signed   By: StewSuzy Bouchard.   On: 08/26/2015 17:27   Dg Chest Portable 1 View  08/24/2015  CLINICAL DATA:  87 y37r old male with cough, fever and chills for 1 day. EXAM: PORTABLE CHEST 1 VIEW COMPARISON:  None. FINDINGS: Cardiomegaly and CABG changes identified. There is no evidence of focal airspace disease, pulmonary edema, suspicious pulmonary nodule/mass, pleural effusion, or pneumothorax. No acute bony abnormalities are identified. IMPRESSION: Cardiomegaly without acute cardiopulmonary disease. Electronically Signed   By: JeffMargarette Canada.   On: 08/24/2015 12:25    Assessment/Plan 1.  End-stage renal disease requiring hemodialysis:  Patient will begin dialysis therapy tomorrow once a tunneled catheter has been placed. The risks and benefits of been reviewed with the family all questions answered and they are in agreement with proceeding  2.  Uremia with multiple complications including lethargy and severely depressed mental status: Patient will be initiated on hemodialysis since this should result in clearing of his mental status to a significant degree 3.  Hypertension:  Patient will continue medical  management; nephrology is following no changes in oral medications. 4. Diabetes mellitus:  Glucose will be monitored and oral medications been held this morning once the patient has undergone the patient's procedure po intake will be reinitiated and again Accu-Cheks will be used to assess the blood glucose level and treat as needed. The patient will be restarted on the patient's usual hypoglycemic regime 5.  Coronary artery disease:  EKG will be monitored. Nitrates will be used if needed. The patient's oral cardiac medications will be continued.     , GregDolores Lory  08/31/2015 5:18 PM

## 2015-08-31 NOTE — Progress Notes (Signed)
Inpatient Diabetes Program Recommendations  AACE/ADA: New Consensus Statement on Inpatient Glycemic Control (2015)  Target Ranges:  Prepandial:   less than 140 mg/dL      [facility] postprandial:   less than 180 mg/dL (1-2 hours)      Critically ill patients:  140 - 180 mg/dL   Review of Glycemic Control:  Results for Kenneth [name], Kenneth [name] (MRN [id number]) as of 08/31/2015 12:12  Ref. Range 08/29/2015 16:28 08/30/2015 07:48 08/30/2015 11:37 08/30/2015 17:14 08/31/2015 08:13  Glucose-Capillary Latest Ref Range: 65-99 mg/dL 409154 (H) 811147 (H) 914197 (H) 200 (H) 196 (H)    Inpatient Diabetes Program Recommendations:    Consider increasing Lantus to 14 units q HS.  Thanks, Kenneth MeagerJenny Seba Madole, RN, BC-ADM Inpatient Diabetes Coordinator Pager [phone] (8a-5p)

## 2015-08-31 NOTE — Progress Notes (Signed)
Clinical Social Worker ([name]) met with patient's wife and daughter Kenneth [name] at bedside. Wife and Kenneth [name] had several questions about long term care and short term rehab. [name] explained long term care options and medicaid VS. Private pay. Per wife patient would not qualify for Medicaid. [name] gave daughter phone number to [facility] worker to inquire about eligibility. [name] encouraged daughter to call the customer service number on the back of patient's insurance card to inquire about SNF co-pays and home health benefits. Daughter and wife are in agreement with patient going to [facility] for short term rehab.   Per MD patient will require dialysis. [name] made Kim dialysis liaison aware of above. [name] liaison is aware of above and reported that they can accept patient on dialysis. [name] will continue to follow and assist as needed.   Kenneth [name], [place] [phone]

## 2015-08-31 NOTE — Progress Notes (Signed)
Patient: Kenneth [name] / Admit Date: 08/24/2015 / Date of Encounter: 08/31/2015, 1:08 PM   Subjective: Asked to see patient again 2.2 recurrence of chest pain that is worse with coughing and deep inspiration. In short, patient has a PMH of CAD (s/p CABG in 1999 w/ LIMA-LAD and SVG-OM1, SVG-OM1 known to be occluded), mild ischemic cardiomyopathy, COPD, OSA, and HTN, who presented with weakness, vomiting, malaise, vertigo and chest tightness on 08/24/2015 who has been managed medically.   Review of Systems: Review of Systems  Unable to perform ROS: medical condition    Objective: Telemetry: NSR, 60's Physical Exam: Blood pressure 151/66, pulse 91, temperature 98.4 F (36.9 C), temperature source Oral, resp. rate 16, height 5\' 6"  (1.676 m), weight 178 lb 8 oz (80.967 kg), SpO2 93 %. Body mass index is 28.82 kg/(m^2). General: Well developed, well nourished, in no acute distress. Some confusion. Head: Normocephalic, atraumatic, sclera non-icteric, no xanthomas, nares are without discharge. Neck: Negative for carotid bruits. JVP not elevated. Lungs: Clear bilaterally to auscultation without wheezes, rales, or rhonchi. Breathing is unlabored. Heart: RRR S1 S2 without murmurs, rubs, or gallops.  Abdomen: Soft, non-tender, non-distended with normoactive bowel sounds. No rebound/guarding. Extremities: No clubbing or cyanosis. No edema. Distal pedal pulses are 2+ and equal bilaterally. Neuro: Alert. Moves all extremities spontaneously. Psych:  Responds to questions appropriately with a normal affect.   Intake/Output Summary (Last 24 hours) at 08/31/15 1308 Last data filed at 08/31/15 1248  Gross per 24 hour  Intake 1861.25 ml  Output    600 ml  Net 1261.25 ml    Inpatient Medications:  . aspirin EC  81 mg Oral Daily  . azithromycin  500 mg Oral Daily  . heparin subcutaneous  5,000 Units Subcutaneous 3 times per day  . Influenza vac split quadrivalent PF  0.5 mL Intramuscular  Tomorrow-1000  . insulin aspart  0-15 Units Subcutaneous TID WC  . insulin glargine  10 Units Subcutaneous QHS  . meclizine  12.5 mg Oral TID  . metoprolol succinate  50 mg Oral Daily  . pantoprazole  40 mg Oral Daily  . pravastatin  10 mg Oral q1800  . senna-docusate  2 tablet Oral Daily  . sodium chloride  3 mL Intravenous Q12H  . tamsulosin  0.4 mg Oral Daily   Infusions:    Labs:  Recent Labs  08/30/15 0427 08/31/15 0425  NA 139 140  K 4.7 4.6  CL 109 106  CO2 17* 25  GLUCOSE 166* 212*  BUN 94* 99*  CREATININE 6.84* 7.46*  CALCIUM 8.1* 8.0*  PHOS  --  4.7*    Recent Labs  08/31/15 0425  ALBUMIN 2.3*    Recent Labs  08/30/15 0427 08/31/15 0425  WBC 10.5 10.4  HGB 10.0* 10.3*  HCT 30.2* 30.6*  MCV 94.7 94.3  PLT 400 491*   No results for input(s): CKTOTAL, CKMB, TROPONINI in the last 72 hours. Invalid input(s): POCBNP No results for input(s): HGBA1C in the last 72 hours.   Weights: Filed Weights   08/24/15 1038 08/24/15 1547  Weight: 185 lb (83.915 kg) 178 lb 8 oz (80.967 kg)     Radiology/Studies:  Dg Chest 2 View  08/30/2015  CLINICAL DATA:  Continued chest pain and weakness. Follow-up bronchitis. EXAM: CHEST  2 VIEW COMPARISON:  08/26/2015 FINDINGS: Median sternotomy and postsurgical changes from CABG are stable. Cardiomediastinal silhouette is stably enlarged. Mediastinal contours appear intact. There is no evidence of focal airspace consolidation,  pleural effusion or pneumothorax. Osseous structures are without acute abnormality. Soft tissues are grossly normal. IMPRESSION: Stably enlarged cardiac silhouette without evidence of acute cardiopulmonary process. Electronically Signed   By: Ted Mcalpine M.D.   On: 08/30/2015 10:10   Dg Chest 2 View  08/26/2015  CLINICAL DATA:  Cough, chest pain, fever EXAM: CHEST  2 VIEW COMPARISON:  08/24/2015 FINDINGS: Cardiomediastinal silhouette is stable. Status post median sternotomy. No acute infiltrate or  pleural effusion. No pulmonary edema. Mild left basilar atelectasis. IMPRESSION: No infiltrate or pulmonary edema. Cardiomegaly again noted. Mild left basilar atelectasis. Electronically Signed   By: Natasha Mead M.D.   On: 08/26/2015 16:12   Mr Maxine Glenn Head Wo Contrast  08/25/2015  CLINICAL DATA:  Ataxia EXAM: MRA HEAD WITHOUT CONTRAST TECHNIQUE: Angiographic images of the Circle of Willis were obtained using MRA technique without intravenous contrast. COMPARISON:  MR head 08/25/2015 FINDINGS: Mild to moderate stenosis distal left vertebral artery. Right vertebral artery widely patent. Basilar widely patent. Superior cerebellar and posterior cerebral arteries patent bilaterally. Internal carotid artery widely patent bilaterally without significant stenosis. Hypoplastic right A1 segment. Both anterior cerebral arteries are patent and supplied from the left. Both middle cerebral arteries patent without significant stenosis. Negative for aneurysm or vascular malformation. IMPRESSION: Mild to moderate stenosis distal left vertebral artery. Otherwise no significant intracranial stenosis. Electronically Signed   By: Marlan Palau M.D.   On: 08/25/2015 13:58   Mr Angiogram Neck Wo Contrast  08/25/2015  CLINICAL DATA:  Ataxia EXAM: MRA NECK WITHOUT CONTRAST TECHNIQUE: Angiographic images of the neck were obtained using MRA technique without intravenous contrast. Carotid stenosis measurements (when applicable) are obtained utilizing NASCET criteria, using the distal internal carotid diameter as the denominator. COMPARISON:  MR head 08/25/2015 FINDINGS: MRA NECK FINDINGS Image quality degraded by mild motion and lack of intravenous contrast. Carotid bifurcation widely patent bilaterally. Tortuosity of the cervical internal carotid artery bilaterally. Negative for stenosis or dissection Both vertebral arteries patent to the basilar. Mild stenosis distal left vertebral artery. IMPRESSION: No significant carotid stenosis. Mild  stenosis distal left vertebral artery. Electronically Signed   By: Marlan Palau M.D.   On: 08/25/2015 13:15   Mr Brain Wo Contrast  08/25/2015  CLINICAL DATA:  Ataxia EXAM: MRI HEAD WITHOUT CONTRAST TECHNIQUE: Multiplanar, multiecho pulse sequences of the brain and surrounding structures were obtained without intravenous contrast. COMPARISON:  CT 08/02/2011 FINDINGS: Negative for acute infarct Moderate atrophy. Moderate chronic microvascular ischemic change in the cerebral white matter. Mild chronic ischemia in the basal ganglia and pons. Chronic infarct high left frontal lobe. Negative for intracranial hemorrhage.  Negative for fluid collection Negative for mass or edema.  No shift of the midline structures. Paranasal sinuses clear.  Negative orbit.  Pituitary not enlarged. IMPRESSION: Atrophy and chronic ischemia.  No acute abnormality. Electronically Signed   By: Marlan Palau M.D.   On: 08/25/2015 12:56   US Renal  08/28/2015  CLINICAL DATA:  Acute renal failure EXAM: RENAL / URINARY TRACT ULTRASOUND COMPLETE COMPARISON:  02/22/2014 FINDINGS: Right Kidney: Length: 10.8 cm. Normal echogenicity. Mild cortical thinning probable due to atrophy stable from prior exam. No hydronephrosis. No renal calculus. Left Kidney: Length: 12.6 cm in length. Echogenicity within normal limits. No mass or hydronephrosis visualized. Bladder: Appears normal for degree of bladder distention. IMPRESSION: No hydronephrosis or renal calculi. Unremarkable urinary bladder. Stable mild cortical thinning right kidney probable due to atrophy. Electronically Signed   By: Natasha Mead M.D.   On:  08/28/2015 12:10   Nm Pulmonary Perf And Vent  08/26/2015  CLINICAL DATA:  Tachycardia, asthma, COPD, bronchitis. EXAM: NUCLEAR MEDICINE VENTILATION - PERFUSION LUNG SCAN TECHNIQUE: Ventilation images were obtained in multiple projections using inhaled aerosol Tc-5860m DTPA. Perfusion images were obtained in multiple projections after intravenous  injection of Tc-4760m MAA. RADIOPHARMACEUTICALS:  Thirty-two Technetium-5260m DTPA aerosol inhalation and 4.3 Technetium-3460m MAA IV COMPARISON:  08/24/2015 FINDINGS: Ventilation: There multiple ventilation defects. There is overall heterogeneous ventilation. Perfusion: There is a perfusion defect within the LEFT lower lobe which matches a ventilation defect. Seen on LPO projection. IMPRESSION: No evidence of pulmonary embolism. Matched ventilation-perfusion defects related to COPD. Electronically Signed   By: Genevive BiStewart  Edmunds M.D.   On: 08/26/2015 17:27   Dg Chest Portable 1 View  08/24/2015  CLINICAL DATA:  82 year old male with cough, fever and chills for 1 day. EXAM: PORTABLE CHEST 1 VIEW COMPARISON:  None. FINDINGS: Cardiomegaly and CABG changes identified. There is no evidence of focal airspace disease, pulmonary edema, suspicious pulmonary nodule/mass, pleural effusion, or pneumothorax. No acute bony abnormalities are identified. IMPRESSION: Cardiomegaly without acute cardiopulmonary disease. Electronically Signed   By: Harmon PierJeffrey  Hu M.D.   On: 08/24/2015 12:25     Assessment and Plan   1. Atypical chest pain: -Symptoms appear to be pleuritic in etiology as they appear when he coughs or takes deep inspiration -No changes in his symptoms from prior -No exertional component -Will check ECG -Renal failure precludes troponin at this time -Has normal LV function on echo earlier this admission  -No further ischemic evaluation at this time   2. AMS: -Confusion remains -MRI/MRA without acute pathology -Per IM  3. Possible prior URI: -On azithromycin -Possibly playing a role in #1 -Per IM  4. Acute on CKD stage IV: -Renal on board -Planning for Permcath -Avoid nephrotoxins   Signed, Eula ListenRyan Dunn, PA-C Pager: [phone] 08/31/2015, 1:08 PM

## 2015-08-31 NOTE — Progress Notes (Signed)
PT Cancellation Note  Patient Details Name: Kenneth [name] T Cassin MRN: [id number] DOB: July 07, 1928   Cancelled Treatment:    Reason Eval/Treat Not Completed: Fatigue/lethargy limiting ability to participate (Treatment session attempted.  Patient recently returned to bed after OOB to chair x2-3 hours per wife; notable fatigued.  Consistently falling asleep while attempting to talk to therapist.  Of note, noted with sats in 86-88% while dozing; RN informed/aware and therapist applied 2L supplemental O2 for sats >90%.  Also--patient scheduled for permcath placement and initiation of dialysis next date; will continue to follow as appropriate.)   Ilija Maxim H. Manson PasseyBrown, PT, DPT, NCS 08/31/2015, 4:10 PM [phone]

## 2015-08-31 NOTE — Care Management (Signed)
Vascular consult present for permcath so can initiate hemo dialysis.  Unsure if need for dialysis will be chronic

## 2015-08-31 NOTE — Progress Notes (Signed)
Pt refuses CPAP. Pt on 2 liter nasal cannula. No distress noted

## 2015-08-31 NOTE — Progress Notes (Signed)
Per family request, patients pill box that was stored in pharmacy has been returned to wife to take home.

## 2015-08-31 NOTE — Progress Notes (Signed)
[facility]  ROUNDING NOTE   Subjective:   S Creatinine is worse Patient is more confused than usual per family Hematuria noted ANA is neg No acute SOB     Objective:  Vital signs in last 24 hours:  Temp:  [97.5 F (36.4 C)-98.4 F (36.9 C)] 98.4 F (36.9 C) (01/09 1104) Pulse Rate:  [91-102] 91 (01/09 1104) Resp:  [16-24] 16 (01/09 1104) BP: (147-169)/(63-73) 151/66 mmHg (01/09 1104) SpO2:  [93 %-96 %] 93 % (01/09 1104)  Weight change:  Filed Weights   08/24/15 1038 08/24/15 1547  Weight: 83.915 kg (185 lb) 80.967 kg (178 lb 8 oz)    Intake/Output: I/O last 3 completed shifts: In: 2183 [P.O.:480; I.V.:1703] Out: 1400 [Urine:1400]   Intake/Output this shift:  Total I/O In: 240 [P.O.:240] Out: -   Physical Exam: General: NAD, sitting up in chair  Head: Normocephalic, atraumatic. Moist oral mucosal membranes  Eyes: Anicteric,   Neck: Supple, trachea midline  Lungs:  Clear to auscultation  Heart: Regular rate and rhythm  Abdomen:  Soft, nontender,   Extremities: no peripheral edema.  Neurologic: Nonfocal, moving all four extremities  Skin: No lesions       Basic Metabolic Panel:  Recent Labs Lab 08/26/15 0438 08/27/15 0520 08/28/15 0528 08/30/15 0427 08/31/15 0425  NA 136 137 139 139 140  K 4.4 4.7 4.8 4.7 4.6  CL 102 105 107 109 106  CO2 25 25 24 17* 25  GLUCOSE 227* 170* 165* 166* 212*  BUN 43* 52* 69* 94* 99*  CREATININE 2.70* 3.75* 4.60* 6.84* 7.46*  CALCIUM 8.3* 8.2* 8.4* 8.1* 8.0*  PHOS  --   --   --   --  4.7*    Liver Function Tests:  Recent Labs Lab 08/31/15 0425  ALBUMIN 2.3*   No results for input(s): LIPASE, AMYLASE in the last 168 hours. No results for input(s): AMMONIA in the last 168 hours.  CBC:  Recent Labs Lab 08/26/15 0908 08/27/15 0520 08/28/15 0528 08/30/15 0427 08/31/15 0425  WBC 5.3 5.2 6.5 10.5 10.4  HGB 10.4* 10.1* 10.2* 10.0* 10.3*  HCT 31.3* 29.8* 30.9* 30.2* 30.6*  MCV 92.6 94.1 93.7  94.7 94.3  PLT 271 251 281 400 491*    Cardiac Enzymes:  Recent Labs Lab 08/24/15 1725 08/24/15 2242 08/25/15 0412  TROPONINI 0.17* 0.17* 0.15*    BNP: Invalid input(s): POCBNP  CBG:  Recent Labs Lab 08/29/15 1628 08/30/15 0748 08/30/15 1137 08/30/15 1714 08/31/15 0813  GLUCAP 154* 147* 197* 200* 196*    Microbiology: Results for orders placed or performed during the hospital encounter of 08/24/15  CULTURE, BLOOD (ROUTINE X 2) w Reflex to PCR ID Panel     Status: None (Preliminary result)   Collection Time: 08/26/15  1:47 PM  Result Value Ref Range Status   Specimen Description BLOOD RIGHT HAND  Final   Special Requests   Final    BOTTLES DRAWN AEROBIC AND ANAEROBIC 2CCAEROB,2CCANA   Culture NO GROWTH 4 DAYS  Final   Report Status PENDING  Incomplete  CULTURE, BLOOD (ROUTINE X 2) w Reflex to PCR ID Panel     Status: None (Preliminary result)   Collection Time: 08/26/15  1:56 PM  Result Value Ref Range Status   Specimen Description BLOOD LEFT HAND  Final   Special Requests   Final    BOTTLES DRAWN AEROBIC AND ANAEROBIC 2CCAEROB,1CCANA   Culture NO GROWTH 4 DAYS  Final   Report Status PENDING    Incomplete    Coagulation Studies: No results for input(s): LABPROT, INR in the last 72 hours.  Urinalysis:  Recent Labs  08/28/15 1311  COLORURINE YELLOW*  LABSPEC 1.012  PHURINE 5.0  GLUCOSEU 50*  HGBUR 3+*  BILIRUBINUR NEGATIVE  KETONESUR NEGATIVE  PROTEINUR 100*  NITRITE NEGATIVE  LEUKOCYTESUR NEGATIVE      Imaging: Dg Chest 2 View  08/30/2015  CLINICAL DATA:  Continued chest pain and weakness. Follow-up bronchitis. EXAM: CHEST  2 VIEW COMPARISON:  08/26/2015 FINDINGS: Median sternotomy and postsurgical changes from CABG are stable. Cardiomediastinal silhouette is stably enlarged. Mediastinal contours appear intact. There is no evidence of focal airspace consolidation, pleural effusion or pneumothorax. Osseous structures are without acute abnormality.  Soft tissues are grossly normal. IMPRESSION: Stably enlarged cardiac silhouette without evidence of acute cardiopulmonary process. Electronically Signed   By: Fidela Salisbury M.D.   On: 08/30/2015 10:10     Medications:   .  sodium bicarbonate  infusion 1000 mL 75 mL/hr at 08/31/15 0320   . aspirin EC  81 mg Oral Daily  . azithromycin  500 mg Oral Daily  . heparin subcutaneous  5,000 Units Subcutaneous 3 times per day  . Influenza vac split quadrivalent PF  0.5 mL Intramuscular Tomorrow-1000  . insulin aspart  0-15 Units Subcutaneous TID WC  . insulin glargine  10 Units Subcutaneous QHS  . meclizine  12.5 mg Oral TID  . metoprolol succinate  50 mg Oral Daily  . pantoprazole  40 mg Oral Daily  . pravastatin  10 mg Oral q1800  . senna-docusate  2 tablet Oral Daily  . sodium chloride  3 mL Intravenous Q12H  . tamsulosin  0.4 mg Oral Daily   sodium chloride, acetaminophen **OR** acetaminophen, hydrALAZINE, ipratropium-albuterol, oxyCODONE-acetaminophen, polyethylene glycol, sodium chloride  Assessment/ Plan:  [name] is a 82 y.o. white male diabetes mellitus type II, hypertension, hyperlipidemia, coronary artery disease, Allergic rhinitis, osteoarthritis, polymyalgia rheumatica, obstructive sleep apnea, who was admitted to [facility] on 08/24/2015 for NSTEMI (non-ST elevated myocardial infarction) ([place]) [I21.4]  1. Acute renal failure on CKD st 4 with metabolic acidosis, hematuria and proteinuria: baseline creatinine of 2.14, eGFR of 26 on 08/25/15.  Acute renal failure seems to be due to ATN. Lisinopril, hypotension and vancomycin   Chronic kidney disease secondary to diabetic nephropathy Renal function is worsening.  - DDX includes ATN, secondary to infection vs vanc toxicity - vasculitis- discussed with family. Biopsy too risky. Will order serologies. ANA is negative - consult vasculat for permcath - not sure how long patient might need dialysis treatment - will go with  permcath  2.  Diabetes Mellitus type II with chronic kidney disease: insulin dependent - holding metformin. Do not recommend restarting this medication - hemoglobin A1c of 7.6% on admission, not at goal.   3. Anemia of chronic kidney disease: hemoglobin of 10.3. Normocytic  - has not received epo.   Case discussed in great detail with patient's wife and daughter   LOS: 7 [place]ound[place] 1/9/201712:18 PM

## 2015-08-31 NOTE — Progress Notes (Addendum)
Patients family reports Mr. Felisa Bonierickards red flip phone is missing. This RN and two aides have thoroughly searched room and bathroom with no discovery. Dining was called to see if phone had been taken out on a tray accidentally, but they report no phone has been found. Per family, patient loses phone frequently due to his confusion & patient is, of course, unable to answer questions as to its whereabouts. 2A director notified and has been in touch with night RN and NT, both report they does not recall seeing it during the night shift. Will continue to keep an eye out for it.

## 2015-08-31 NOTE — Plan of Care (Signed)
Problem: Activity: Goal: Ability to tolerate increased activity will improve Outcome: Progressing Patient sat up in chair today for 2-3 hours and tolerated well.   Problem: Fluid Volume: Goal: Compliance with measures to maintain balanced fluid volume will improve Outcome: Not Progressing Patient going for perm-cath placement tomorrow to begin dialysis.

## 2015-09-01 ENCOUNTER — Encounter
Admission: EM | Disposition: A | Discharge status: Skilled Nursing Facility | Payer: Self-pay | Source: Home / Self Care | Attending: Internal Medicine

## 2015-09-01 HISTORY — PX: PERIPHERAL VASCULAR CATHETERIZATION: SHX172C

## 2015-09-01 LAB — PROTEIN ELECTROPHORESIS, SERUM
A/G Ratio: 0.5 — ABNORMAL LOW (ref 0.7–1.7)
ALPHA-1-GLOBULIN: 0.5 g/dL — AB (ref 0.0–0.4)
Albumin ELP: 2 g/dL — ABNORMAL LOW (ref 2.9–4.4)
Alpha-2-Globulin: 0.9 g/dL (ref 0.4–1.0)
Beta Globulin: 1 g/dL (ref 0.7–1.3)
GAMMA GLOBULIN: 1.4 g/dL (ref 0.4–1.8)
Globulin, Total: 3.8 g/dL (ref 2.2–3.9)
M-SPIKE, %: 0.6 g/dL — AB
TOTAL PROTEIN ELP: 5.8 g/dL — AB (ref 6.0–8.5)

## 2015-09-01 LAB — GLUCOSE, CAPILLARY
GLUCOSE-CAPILLARY: 122 mg/dL — AB (ref 65–99)
GLUCOSE-CAPILLARY: 170 mg/dL — AB (ref 65–99)
GLUCOSE-CAPILLARY: 240 mg/dL — AB (ref 65–99)
Glucose-Capillary: 215 mg/dL — ABNORMAL HIGH (ref 65–99)

## 2015-09-01 SURGERY — DIALYSIS/PERMA CATHETER INSERTION
Anesthesia: Moderate Sedation

## 2015-09-01 MED ORDER — ATORVASTATIN CALCIUM 20 MG PO TABS
40.0000 mg | ORAL_TABLET | Freq: Every day | ORAL | Status: DC
  Administered 2015-09-01 – 2015-09-04 (×4): 40 mg via ORAL
  Filled 2015-09-01 (×4): qty 2

## 2015-09-01 MED ORDER — FAMOTIDINE 20 MG PO TABS
20.0000 mg | ORAL_TABLET | Freq: Once | ORAL | Status: AC
  Administered 2015-09-01: 20 mg via ORAL

## 2015-09-01 MED ORDER — METHYLPREDNISOLONE SODIUM SUCC 125 MG IJ SOLR
INTRAMUSCULAR | Status: AC
  Administered 2015-09-01: 125 mg via INTRAVENOUS
  Filled 2015-09-01: qty 2

## 2015-09-01 MED ORDER — LIDOCAINE-EPINEPHRINE (PF) 1 %-1:200000 IJ SOLN
INTRAMUSCULAR | Status: AC
  Filled 2015-09-01: qty 30

## 2015-09-01 MED ORDER — HEPARIN SODIUM (PORCINE) 10000 UNIT/ML IJ SOLN
INTRAMUSCULAR | Status: AC
  Filled 2015-09-01: qty 1

## 2015-09-01 MED ORDER — METHYLPREDNISOLONE SODIUM SUCC 125 MG IJ SOLR
125.0000 mg | Freq: Once | INTRAMUSCULAR | Status: AC
  Administered 2015-09-01: 125 mg via INTRAVENOUS

## 2015-09-01 MED ORDER — MIDAZOLAM HCL 2 MG/2ML IJ SOLN
INTRAMUSCULAR | Status: DC | PRN
  Administered 2015-09-01: 1 mg via INTRAVENOUS

## 2015-09-01 MED ORDER — FENTANYL CITRATE (PF) 100 MCG/2ML IJ SOLN
INTRAMUSCULAR | Status: DC | PRN
  Administered 2015-09-01: 50 ug via INTRAVENOUS

## 2015-09-01 MED ORDER — HEPARIN (PORCINE) IN NACL 2-0.9 UNIT/ML-% IJ SOLN
INTRAMUSCULAR | Status: AC
  Filled 2015-09-01: qty 500

## 2015-09-01 MED ORDER — MIDAZOLAM HCL 2 MG/2ML IJ SOLN
INTRAMUSCULAR | Status: AC
  Filled 2015-09-01: qty 2

## 2015-09-01 MED ORDER — FENTANYL CITRATE (PF) 100 MCG/2ML IJ SOLN
INTRAMUSCULAR | Status: AC
  Filled 2015-09-01: qty 2

## 2015-09-01 MED ORDER — CEFUROXIME SODIUM 1.5 G IJ SOLR
1.5000 g | Freq: Once | INTRAMUSCULAR | Status: AC
  Administered 2015-09-01: 1.5 g via INTRAVENOUS

## 2015-09-01 MED ORDER — LIDOCAINE-EPINEPHRINE (PF) 1 %-1:200000 IJ SOLN
INTRAMUSCULAR | Status: DC | PRN
  Administered 2015-09-01: 20 mL via INTRADERMAL

## 2015-09-01 MED ORDER — FAMOTIDINE 20 MG PO TABS
ORAL_TABLET | ORAL | Status: AC
  Administered 2015-09-01: 20 mg via ORAL
  Filled 2015-09-01: qty 1

## 2015-09-01 SURGICAL SUPPLY — 10 items
BIOPATCH RED 1 DISK 7.0 (GAUZE/BANDAGES/DRESSINGS) ×2 IMPLANT
BIOPATCH RED 1IN DISK 7.0MM (GAUZE/BANDAGES/DRESSINGS) ×1
CATH PALINDROME RT-P 15FX19CM (CATHETERS) ×3 IMPLANT
DERMABOND ADVANCED (GAUZE/BANDAGES/DRESSINGS) ×2
DERMABOND ADVANCED .7 DNX12 (GAUZE/BANDAGES/DRESSINGS) ×1 IMPLANT
DRAPE INCISE IOBAN 66X45 STRL (DRAPES) ×3 IMPLANT
PACK ANGIOGRAPHY (CUSTOM PROCEDURE TRAY) ×3 IMPLANT
SUT MNCRL AB 4-0 PS2 18 (SUTURE) ×3 IMPLANT
SUT SILK 0 FSL (SUTURE) ×3 IMPLANT
TOWEL OR 17X26 4PK STRL BLUE (TOWEL DISPOSABLE) ×3 IMPLANT

## 2015-09-01 NOTE — Progress Notes (Signed)
Pt arrived from [facility]. Family c/o cell phone missing and masonic ring. Masonic ring found by pt's wife in room's floor. Will notified Manager of missing phone. VS stable assessment completed

## 2015-09-01 NOTE — Progress Notes (Addendum)
[facility]  ROUNDING NOTE   Subjective:   PermCath placed today No acute shortness of breath Hematuria noted ANA is neg      Objective:  Vital signs in last 24 hours:  Temp:  [97.4 F (36.3 C)-98.5 F (36.9 C)] 97.4 F (36.3 C) (01/10 1117) Pulse Rate:  [92-98] 98 (01/10 1117) Resp:  [15-22] 18 (01/10 1117) BP: (147-173)/(62-84) 166/84 mmHg (01/10 1117) SpO2:  [90 %-98 %] 96 % (01/10 1117)  Weight change:  Filed Weights   08/24/15 1038 08/24/15 1547  Weight: 83.915 kg (185 lb) 80.967 kg (178 lb 8 oz)    Intake/Output: I/O last 3 completed shifts: In: 2101.3 [P.O.:720; I.V.:1381.3] Out: 1075 [Urine:1075]   Intake/Output this shift:  Total I/O In: 600 [P.O.:600] Out: -   Physical Exam: General: NAD, sitting up in chair  Head: Normocephalic, atraumatic. Moist oral mucosal membranes  Eyes: Anicteric,   Neck: Supple, trachea midline  Lungs:  Clear to auscultation  Heart: Regular rate and rhythm  Abdomen:  Soft, nontender,   Extremities: no peripheral edema.  Neurologic: Nonfocal, moving all four extremities  Skin: No lesions       Basic Metabolic Panel:  Recent Labs Lab 08/26/15 0438 08/27/15 0520 08/28/15 0528 08/30/15 0427 08/31/15 0425  NA 136 137 139 139 140  K 4.4 4.7 4.8 4.7 4.6  CL 102 105 107 109 106  CO2 25 25 24 17* 25  GLUCOSE 227* 170* 165* 166* 212*  BUN 43* 52* 69* 94* 99*  CREATININE 2.70* 3.75* 4.60* 6.84* 7.46*  CALCIUM 8.3* 8.2* 8.4* 8.1* 8.0*  PHOS  --   --   --   --  4.7*    Liver Function Tests:  Recent Labs Lab 08/31/15 0425  ALBUMIN 2.3*   No results for input(s): LIPASE, AMYLASE in the last 168 hours. No results for input(s): AMMONIA in the last 168 hours.  CBC:  Recent Labs Lab 08/26/15 0908 08/27/15 0520 08/28/15 0528 08/30/15 0427 08/31/15 0425  WBC 5.3 5.2 6.5 10.5 10.4  HGB 10.4* 10.1* 10.2* 10.0* 10.3*  HCT 31.3* 29.8* 30.9* 30.2* 30.6*  MCV 92.6 94.1 93.7 94.7 94.3  PLT 271 251  281 400 491*    Cardiac Enzymes: No results for input(s): CKTOTAL, CKMB, CKMBINDEX, TROPONINI in the last 168 hours.  BNP: Invalid input(s): POCBNP  CBG:  Recent Labs Lab 08/31/15 1159 08/31/15 1637 08/31/15 1945 09/01/15 0724 09/01/15 1112  GLUCAP 137* 110* 162* 122* 170*    Microbiology: Results for orders placed or performed during the hospital encounter of 08/24/15  CULTURE, BLOOD (ROUTINE X 2) w Reflex to PCR ID Panel     Status: None   Collection Time: 08/26/15  1:47 PM  Result Value Ref Range Status   Specimen Description BLOOD RIGHT HAND  Final   Special Requests   Final    BOTTLES DRAWN AEROBIC AND ANAEROBIC 2CCAEROB,2CCANA   Culture NO GROWTH 5 DAYS  Final   Report Status 08/31/2015 FINAL  Final  CULTURE, BLOOD (ROUTINE X 2) w Reflex to PCR ID Panel     Status: None   Collection Time: 08/26/15  1:56 PM  Result Value Ref Range Status   Specimen Description BLOOD LEFT HAND  Final   Special Requests   Final    BOTTLES DRAWN AEROBIC AND ANAEROBIC 2CCAEROB,1CCANA   Culture NO GROWTH 5 DAYS  Final   Report Status 08/31/2015 FINAL  Final    Coagulation Studies: No results for input(s): LABPROT, INR   in the last 72 hours.  Urinalysis: No results for input(s): COLORURINE, LABSPEC, PHURINE, GLUCOSEU, HGBUR, BILIRUBINUR, KETONESUR, PROTEINUR, UROBILINOGEN, NITRITE, LEUKOCYTESUR in the last 72 hours.  Invalid input(s): APPERANCEUR    Imaging: No results found.   Medications:     . aspirin EC  81 mg Oral Daily  . atorvastatin  40 mg Oral q1800  . heparin subcutaneous  5,000 Units Subcutaneous 3 times per day  . Influenza vac split quadrivalent PF  0.5 mL Intramuscular Tomorrow-1000  . insulin aspart  0-15 Units Subcutaneous TID WC  . insulin glargine  10 Units Subcutaneous QHS  . meclizine  12.5 mg Oral TID  . metoprolol succinate  50 mg Oral Daily  . pantoprazole  40 mg Oral Daily  . senna-docusate  2 tablet Oral Daily  . sodium chloride  3 mL  Intravenous Q12H  . tamsulosin  0.4 mg Oral Daily   sodium chloride, acetaminophen **OR** acetaminophen, hydrALAZINE, ipratropium-albuterol, oxyCODONE-acetaminophen, polyethylene glycol, sodium chloride  Assessment/ Plan:  [name] is a 82 y.o. white male diabetes mellitus type II, hypertension, hyperlipidemia, coronary artery disease, Allergic rhinitis, osteoarthritis, polymyalgia rheumatica, obstructive sleep apnea, who was admitted to [facility] on 08/24/2015 for NSTEMI (non-ST elevated myocardial infarction) ([facility]) [I21.4]  1. Acute renal failure on CKD st 4 with metabolic acidosis, hematuria and proteinuria: baseline creatinine of 2.14, eGFR of 26 on 08/25/15.  Acute renal failure seems to be due to ATN. Lisinopril, hypotension and vancomycin   Chronic kidney disease secondary to diabetic nephropathy Renal function is worsening.  - DDX includes ATN, secondary to infection vs vanc toxicity - vasculitis- discussed with family. Biopsy too risky. Will order serologies. ANA is negative - small M spike noted On SPEP. - dialysis today to see if it helps his mental status and general condition  2. Proteinuria - Urine protein/creatinine ratio is 1.8 g   3.  Diabetes Mellitus type II with chronic kidney disease: insulin dependent - holding metformin. Do not recommend restarting this medication - hemoglobin A1c of 7.6% on admission,    4. Anemia of chronic kidney disease: hemoglobin of 10.3. Normocytic  - has not received epo.   Case discussed in detail with patient's wife     LOS: 8 , 1/10/20174:50 PM   

## 2015-09-01 NOTE — Progress Notes (Signed)
PT Cancellation Note  Patient Details Name: Kenneth [name] MRN: [id number] DOB: 1927-10-08   Cancelled Treatment:    Reason Eval/Treat Not Completed: Patient at procedure or test/unavailable (Patient currently off unit for permcath placement; likely for dialysis in PM.  Will re-attempt at later time/date as patient available and medically appropriate.)   Cecil Bixby H. Manson PasseyBrown, PT, DPT, NCS 09/01/2015, 7:37 AM [phone]

## 2015-09-01 NOTE — Progress Notes (Signed)
[facility]. Patient resting quietly with family at bedside. No signs of distress. No complaints at this time. Patient to get dialysis this afternoon. Will continue to monitor.

## 2015-09-01 NOTE — Progress Notes (Signed)
[facility] Physicians - [place] at [facility]   PATIENT NAME: Kenneth [name]    MR#:  [id number]  DATE OF BIRTH:  May 22, 1928  SUBJECTIVE:  CHIEF COMPLAINT:   Chief Complaint  Patient presents with  . Chest Pain   Some cough. Kidney func is worse today. Appears weak.   REVIEW OF SYSTEMS:  Review of Systems  Constitutional: Negative for fever and chills.  HENT: Negative for ear pain, nosebleeds and tinnitus.   Eyes: Negative for blurred vision.  Respiratory: Positive for cough and shortness of breath. Negative for wheezing.   Cardiovascular: Negative for chest pain, palpitations and leg swelling.  Gastrointestinal: Negative for nausea, vomiting, abdominal pain, diarrhea and constipation.  Genitourinary: Negative for dysuria, urgency and frequency.  Musculoskeletal: Negative for myalgias, back pain and neck pain.  Neurological: Positive for dizziness and weakness. Negative for tingling, tremors, sensory change, focal weakness, seizures and headaches.    DRUG ALLERGIES:   Allergies  Allergen Reactions  . Beef-Derived Products Hives  . Iodine Hives  . Ivp Dye [Iodinated Diagnostic Agents] Hives    VITALS:  Blood pressure 173/70, pulse 92, temperature 97.8 F (36.6 C), temperature source Oral, resp. rate 18, height 5\' 6"  (1.676 m), weight 80.967 kg (178 lb 8 oz), SpO2 95 %.  PHYSICAL EXAMINATION:  Physical Exam  GENERAL:  82 y.o.-year-old patient lying in the bed in no distress  EYES: Pupils equal, round, reactive to light and accommodation. No scleral icterus. Extraocular muscles intact.  HEENT: Head atraumatic, normocephalic. Oropharynx and nasopharynx clear.  NECK:  Supple, no jugular venous distention. No thyroid enlargement, no tenderness.  LUNGS: Normal breath sounds bilaterally, no wheezing, rales,rhonchi or crepitation. Minimal use of accessory muscles on exertion. Decreased basilar breath sounds. Scattered rhonchi heard at the bases  posteriorly CARDIOVASCULAR: S1, S2 normal. No rubs, or gallops. 3/6 systolic murmur is present ABDOMEN: Soft, nontender, nondistended. Bowel sounds present. No organomegaly or mass.  EXTREMITIES: No pedal edema, cyanosis, or clubbing.  NEUROLOGIC: Cranial nerves II through XII are intact. Muscle strength 4/5 in all extremities. Generalized weakness is present Sensation intact. Gait not checked.  PSYCHIATRIC: The patient is alert and oriented x 3.  SKIN: No obvious rash, lesion, or ulcer.    LABORATORY PANEL:   CBC  Recent Labs Lab 08/31/15 0425  WBC 10.4  HGB 10.3*  HCT 30.6*  PLT 491*   ------------------------------------------------------------------------------------------------------------------  Chemistries   Recent Labs Lab 08/31/15 0425  NA 140  K 4.6  CL 106  CO2 25  GLUCOSE 212*  BUN 99*  CREATININE 7.46*  CALCIUM 8.0*   ------------------------------------------------------------------------------------------------------------------  Cardiac Enzymes No results for input(s): TROPONINI in the last 168 hours. ------------------------------------------------------------------------------------------------------------------  RADIOLOGY:  No results found.  EKG:   Orders placed or performed during the hospital encounter of 08/24/15  . ED EKG  . ED EKG  . EKG 12-Lead  . EKG 12-Lead    ASSESSMENT AND PLAN:   Mykal Batiz is a 82 y.o. male with a known history of diabetes, hypertension, coronary disease status post CABG in the past comes to the emergency room with complaints of chest pain nonradiating with some nausea.  1. 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 No nephrotoxins administered except vancomycin. No imaging or contrast exposure. He was hypotensive briefly. His UA is showing too numerous to count red blood cells and few WBCs but no nitrates.  He has a history of nephrolithiasis but does not  have any pain. Renal ultrasound  showing no calculi or hydronephrosis. Nephrology consultation appreciated. Gradual worsening renal function. Still have satisfactory urine output. Have some hematuria. Today Dr. Thedore MinsSingh ordered some more test, He wills tart HD tomorrow.  2. Altered mental status-improved  Likely metabolic encephalopathy now resolved -Fever on 1/4, isolated, has not recurred. Blood cultures NTD. Possible bronchitis. Chest x-ray clear. Patient has cough and some shortness of breath. Empirically on Vanco and Zosyn, changed to azithromycin.( He received 2 dose of vanc on admission)  VQ scan negative for PE Repeat X ray without edema or infiltrate. Will need SNF.  3. Dizziness-subacute.  improved  Seen by ENT as an outpatient. Started on meclizine with minimal help. -Discontinue Valium due to mental status changes from it. -MRI of the brain and MRA of the head and neck without any acute findings -Physical therapy consult appreciated. Appreciate neurology consult. -Vestibular exercises and therapy recommended.   Need SNF rehab.  4. Elevated troponin with history of coronary artery disease status post CABG in the remote past. Likely demand ischemia.  Appreciate Cardiology consultation, no further interventions Continue cardiac medications.  5. Hypertension- hold lisinopril for now given renal failure  6. Type 2 diabetes- sugars are elevated -continue glipizide. -Hold metformin given his CKD. On SSI -Added Lantus   7. DVT Prophylaxis- on subcutaneous heparin  8. Chest pain   Likely pleuritic due to bronchitis.   VQ scan and Xray were negative.   Pain is constant - worse with deep breath and cough.  percocet for pain .  All the records are reviewed and case discussed with Care Management/Social Workerr. Management plans discussed with the patient, family and they are in agreement. Discussed care plan with his daughter at the bedside today  CODE STATUS: Full  code  TOTAL CRITICAL CARE TIME spent in TAKING CARE OF THIS PATIENT: 40 minutes.   POSSIBLE D/C IN 1-2 DAYS, DEPENDING ON CLINICAL CONDITION.  Need SNF placement.  spoke to his daughter and his wife in room,  updated her.  Dr. Thedore MinsSingh also had a long discussion with family.   Altamese DillingVACHHANI, Orestes Geiman M.D on 09/01/2015 at 10:26 AM  Between 7am to 6pm - Pager - [phone]  After 6pm go to [email or web address] - password [email or web address] [email or web address]  [facility]  Office  [phone]  CC: Primary care physician; Barbette ReichmannHANDE,VISHWANATH, MD

## 2015-09-01 NOTE — Progress Notes (Signed)
Pt going for dialysis now. Left message on wife's cell phone per her request.

## 2015-09-01 NOTE — Care Management Note (Signed)
I am monitoring this patient's current need for dialysis and prepared basic medical records in case outpatient dialysis is needed.  At this time there is no indication from nephrology that patient will need continued dialysis at discharge.  Kenneth [name] Dialysis Liaison  [phone]

## 2015-09-01 NOTE — Progress Notes (Signed)
Family concerned about patient's diet now that he is starting dialysis and asking if he still needs to be on antibiotics. Dr. Elisabeth PigeonVachhani paged - instructed to add renal diet and to inform family that pt has finished course of antibiotics.  Wife may be leaving soon and would like to be called when patient is taken to dialysis.

## 2015-09-01 NOTE — Op Note (Signed)
OPERATIVE NOTE   PROCEDURE: 1. Insertion of tunneled dialysis catheter right internal jugular approach.  PRE-OPERATIVE DIAGNOSIS: Acute on chronic renal insufficiency  POST-OPERATIVE DIAGNOSIS: Same  SURGEON: Schnier, Latina CraverGregory G.  ANESTHESIA: Conscious sedation was administered under my direct supervision. IV Versed plus fentanyl were utilized. Continuous ECG, pulse oximetry and blood pressure was monitored throughout the entire procedure. A total of 1 milligrams of Versed and 50 micrograms of fentanyl were utilized.  Conscious sedation was begun at  8:36 AM and concluded at 8:51 AM for a total of 15 minutes.   ESTIMATED BLOOD LOSS: Minimal cc  CONTRAST USED:  None  FLUOROSCOPY TIME:    INDICATIONS:   Kenneth [name] a 82 y.o. y.o. male who presents with acute on chronic renal insufficiency. He is symptomatic with his progressive uremia and therefore will be starting hemodialysis. He needs appropriate catheter access and is undergoing placement of a tunneled catheter for initiation of dialysis therapy.  DESCRIPTION: After obtaining full informed written consent, the patient was positioned supine. The right neck and chest wall was prepped and draped in a sterile fashion. Ultrasound was placed in a sterile sleeve. Ultrasound was utilized to identify the right internal jugular vein which is noted to be echolucent and compressible indicating patency. Images recorded for the permanent record. Under real-time visualization a Seldinger needle is inserted into the vein and the guidewires advanced without difficulty. Small counterincision was made at the wire insertion site. Dilators are passed over the wire and the tunneled dialysis catheter is fed into the central venous system without difficulty.  Under fluoroscopy the catheter tip positioned at the atrial caval junction. The catheter is then approximated to the chest wall and an exit site selected. 1% lidocaine is infiltrated in soft tissues  at this level small incision is made and the tunneling device is then passed from the exit site to the neck counterincision. Catheter is then connected to the tunneling device and the catheter was pulled subcutaneously. It is then transected and the hub assembly connected without difficulty. Both lumens aspirate and flush easily. After verification of smooth contour with proper tip position under fluoroscopy the catheter is packed with 5000 units of heparin per lumen.  Catheter secured to the skin of the right chest wall with 0 silk. A sterile dressing is applied with a Biopatch.  COMPLICATIONS: None  CONDITION: Unchanged  Schnier, Latina CraverGregory G [place]. Office:  [phone]   09/01/2015,9:10 AM

## 2015-09-01 NOTE — Progress Notes (Signed)
[facility] Physicians - [place] at [facility]   PATIENT NAME: Kenneth [name]    MR#:  [id number]  DATE OF BIRTH:  06/13/1928  SUBJECTIVE:  CHIEF COMPLAINT:   Chief Complaint  Patient presents with  . Chest Pain   Some cough. S/p permacath- 09/02/15- generalized weak, and some confused, no new complains.  REVIEW OF SYSTEMS:  Review of Systems  Constitutional: Negative for fever and chills.  HENT: Negative for ear pain, nosebleeds and tinnitus.   Eyes: Negative for blurred vision.  Respiratory: Positive for cough and shortness of breath. Negative for wheezing.   Cardiovascular: Negative for chest pain, palpitations and leg swelling.  Gastrointestinal: Negative for nausea, vomiting, abdominal pain, diarrhea and constipation.  Genitourinary: Negative for dysuria, urgency and frequency.  Musculoskeletal: Negative for myalgias, back pain and neck pain.  Neurological: Positive for dizziness and weakness. Negative for tingling, tremors, sensory change, focal weakness, seizures and headaches.    DRUG ALLERGIES:   Allergies  Allergen Reactions  . Beef-Derived Products Hives  . Iodine Hives  . Ivp Dye [Iodinated Diagnostic Agents] Hives    VITALS:  Blood pressure 142/64, pulse 100, temperature 98.2 F (36.8 C), temperature source Oral, resp. rate 16, height 5\' 6"  (1.676 m), weight 83.326 kg (183 lb 11.2 oz), SpO2 95 %.  PHYSICAL EXAMINATION:  Physical Exam  GENERAL:  82 y.o.-year-old patient lying in the bed in no distress  EYES: Pupils equal, round, reactive to light and accommodation. No scleral icterus. Extraocular muscles intact.  HEENT: Head atraumatic, normocephalic. Oropharynx and nasopharynx clear.  NECK:  Supple, no jugular venous distention. No thyroid enlargement, no tenderness. Permacath in upper chest present. LUNGS: Normal breath sounds bilaterally, no wheezing, rales,rhonchi or crepitation. Minimal use of accessory muscles on exertion. Decreased  basilar breath sounds. Scattered rhonchi heard at the bases posteriorly CARDIOVASCULAR: S1, S2 normal. No rubs, or gallops. 3/6 systolic murmur is present ABDOMEN: Soft, nontender, nondistended. Bowel sounds present. No organomegaly or mass.  EXTREMITIES: No pedal edema, cyanosis, or clubbing.  NEUROLOGIC: Cranial nerves II through XII are intact. Muscle strength 4/5 in all extremities. Generalized weakness is present Sensation intact. Gait not checked.  PSYCHIATRIC: The patient is alert and oriented x 2.  SKIN: No obvious rash, lesion, or ulcer.    LABORATORY PANEL:   CBC  Recent Labs Lab 08/31/15 0425  WBC 10.4  HGB 10.3*  HCT 30.6*  PLT 491*   ------------------------------------------------------------------------------------------------------------------  Chemistries   Recent Labs Lab 08/31/15 0425  NA 140  K 4.6  CL 106  CO2 25  GLUCOSE 212*  BUN 99*  CREATININE 7.46*  CALCIUM 8.0*   ------------------------------------------------------------------------------------------------------------------  Cardiac Enzymes No results for input(s): TROPONINI in the last 168 hours. ------------------------------------------------------------------------------------------------------------------  RADIOLOGY:  No results found.   ASSESSMENT AND PLAN:   Kenneth [name] is a 82 y.o. male with a known history of diabetes, hypertension, coronary disease status post CABG in the past comes to the emergency room with complaints of chest pain nonradiating with some nausea. Had gradual worsening in renal function.  1. 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 No nephrotoxins administered except vancomycin for 2 days.  No imaging or contrast exposure. He was hypotensive briefly. His UA is showing too numerous to count red blood cells and few WBCs but no nitrates.  He has a history of nephrolithiasis but does not have any pain. Renal  ultrasound  showing no calculi or hydronephrosis. Nephrology consultation  appreciated. Gradual worsening renal function. Still have satisfactory urine output. Have some hematuria. Dr. Thedore Minssingh order blood and urine work ups for vasculitis. Also ordered vascular for Dialysis catheter placement. Starting on HD today.  2. Altered mental status-improved  Likely metabolic encephalopathy now resolved -Fever on 1/4, isolated, has not recurred. Blood cultures NTD. Possible bronchitis. Chest x-ray clear. Patient has cough and some shortness of breath. Empirically on Vanco and Zosyn, changed to azithromycin.( He received 2 dose of vanc on admission)  VQ scan negative for PE Repeat X ray without edema or infiltrate.   Stopped ABx after finishing for 7 days.    Still have some confusion- may be it is due to High BUN.  Will need SNF.  3. Dizziness-subacute.  improved  Seen by ENT as an outpatient. Started on meclizine with minimal help. -Discontinue Valium due to mental status changes from it. -MRI of the brain and MRA of the head and neck without any acute findings -Physical therapy consult appreciated. Appreciate neurology consult. -Vestibular exercises and therapy recommended.   Need SNF rehab.  4. Elevated troponin with history of coronary artery disease status post CABG in the remote past. Likely demand ischemia.  Appreciate Cardiology consultation, no further interventions Continue cardiac medications.  5. Hypertension- hold lisinopril for now given renal failure  6. Type 2 diabetes- sugars are elevated -continue glipizide. -Hold metformin given his CKD. On SSI -Added Lantus   7. DVT Prophylaxis- on subcutaneous heparin  8. Chest pain   Likely pleuritic due to bronchitis.   VQ scan and Xray were negative.   Pain is constant - worse with deep breath and cough.  percocet for pain .  All the records are reviewed and case discussed with Care Management/Social Workerr. Management plans  discussed with the patient, family and they are in agreement. Discussed care plan with his daughter at the bedside today  CODE STATUS: Full code  TOTAL CRITICAL CARE TIME spent in TAKING CARE OF THIS PATIENT: 40 minutes.   POSSIBLE D/C IN 1-2 DAYS, DEPENDING ON CLINICAL CONDITION.  Need SNF placement.  spoke to his daughter and his wife in room,  updated her.  Dr. Thedore MinsSingh also had a long discussion with family.   Altamese DillingVACHHANI, Emmet Messer M.D on 09/01/2015 at 9:54 PM  Between 7am to 6pm - Pager - [phone]  After 6pm go to [email or web address] - password [email or web address] [facility]  [place] [facility]  Office  [phone]  CC: Primary care physician; Barbette ReichmannHANDE,VISHWANATH, MD

## 2015-09-02 ENCOUNTER — Encounter: Payer: Self-pay | Admitting: Vascular Surgery

## 2015-09-02 LAB — BASIC METABOLIC PANEL
ANION GAP: 10 (ref 5–15)
BUN: 87 mg/dL — ABNORMAL HIGH (ref 6–20)
CALCIUM: 7.7 mg/dL — AB (ref 8.9–10.3)
CO2: 28 mmol/L (ref 22–32)
Chloride: 102 mmol/L (ref 101–111)
Creatinine, Ser: 6.87 mg/dL — ABNORMAL HIGH (ref 0.61–1.24)
GFR calc Af Amer: 7 mL/min — ABNORMAL LOW (ref >60)
GFR, EST NON AFRICAN AMERICAN: 6 mL/min — AB (ref >60)
GLUCOSE: 212 mg/dL — AB (ref 65–99)
POTASSIUM: 4.7 mmol/L (ref 3.5–5.1)
SODIUM: 140 mmol/L (ref 135–145)

## 2015-09-02 LAB — GLUCOSE, CAPILLARY
GLUCOSE-CAPILLARY: 193 mg/dL — AB (ref 65–99)
GLUCOSE-CAPILLARY: 217 mg/dL — AB (ref 65–99)
Glucose-Capillary: 191 mg/dL — ABNORMAL HIGH (ref 65–99)
Glucose-Capillary: 108 mg/dL — ABNORMAL HIGH (ref 65–99)

## 2015-09-02 MED ORDER — INSULIN GLARGINE 100 UNIT/ML ~~LOC~~ SOLN
15.0000 [IU] | Freq: Every day | SUBCUTANEOUS | Status: DC
  Administered 2015-09-02 – 2015-09-03 (×2): 15 [IU] via SUBCUTANEOUS
  Filled 2015-09-02 (×3): qty 0.15

## 2015-09-02 MED ORDER — INSULIN GLARGINE 100 UNIT/ML ~~LOC~~ SOLN
5.0000 [IU] | Freq: Once | SUBCUTANEOUS | Status: AC
  Administered 2015-09-02: 5 [IU] via SUBCUTANEOUS
  Filled 2015-09-02: qty 0.05

## 2015-09-02 NOTE — Progress Notes (Signed)
[facility]  ROUNDING NOTE   Subjective:  Late entry: Patient underwent hemodialysis last night.  Feels much better this morning.  More alert and oriented.  Able to read newspaper. No acute shortness of breath Hematuria noted ANA is neg      Objective:  Vital signs in last 24 hours:  Temp:  [97.4 F (36.3 C)-98.4 F (36.9 C)] 97.8 F (36.6 C) (01/11 1500) Pulse Rate:  [82-107] 88 (01/11 1700) Resp:  [16-22] 16 (01/11 1700) BP: (112-165)/(62-86) 144/69 mmHg (01/11 1700) SpO2:  [90 %-96 %] 94 % (01/11 1500) Weight:  [81.647 kg (180 lb)-84.3 kg (185 lb 13.6 oz)] 83.3 kg (183 lb 10.3 oz) (01/11 1500)  Weight change:  Filed Weights   09/01/15 2102 09/02/15 0608 09/02/15 1500  Weight: 83.326 kg (183 lb 11.2 oz) 81.647 kg (180 lb) 83.3 kg (183 lb 10.3 oz)    Intake/Output: I/O last 3 completed shifts: In: 720 [P.O.:720] Out: 1125 [Urine:625; Other:500]   Intake/Output this shift:  Total I/O In: 480 [P.O.:480] Out: 150 [Urine:150]  Physical Exam: General: NAD, sitting up in bed  Head: Normocephalic, atraumatic. Moist oral mucosal membranes  Eyes: Anicteric,   Neck: Supple, trachea midline  Lungs:  Clear to auscultation  Heart: Regular rate and rhythm  Abdomen:  Soft, nontender,   Extremities: no peripheral edema.  Neurologic: Nonfocal, moving all four extremities  Skin: No lesions       Basic Metabolic Panel:  Recent Labs Lab 08/27/15 0520 08/28/15 0528 08/30/15 0427 08/31/15 0425 09/02/15 0551  NA 137 139 139 140 140  K 4.7 4.8 4.7 4.6 4.7  CL 105 107 109 106 102  CO2 25 24 17* 25 28  GLUCOSE 170* 165* 166* 212* 212*  BUN 52* 69* 94* 99* 87*  CREATININE 3.75* 4.60* 6.84* 7.46* 6.87*  CALCIUM 8.2* 8.4* 8.1* 8.0* 7.7*  PHOS  --   --   --  4.7*  --     Liver Function Tests:  Recent Labs Lab 08/31/15 0425  ALBUMIN 2.3*   No results for input(s): LIPASE, AMYLASE in the last 168 hours. No results for input(s): AMMONIA in the last 168  hours.  CBC:  Recent Labs Lab 08/27/15 0520 08/28/15 0528 08/30/15 0427 08/31/15 0425  WBC 5.2 6.5 10.5 10.4  HGB 10.1* 10.2* 10.0* 10.3*  HCT 29.8* 30.9* 30.2* 30.6*  MCV 94.1 93.7 94.7 94.3  PLT 251 281 400 491*    Cardiac Enzymes: No results for input(s): CKTOTAL, CKMB, CKMBINDEX, TROPONINI in the last 168 hours.  BNP: Invalid input(s): POCBNP  CBG:  Recent Labs Lab 09/01/15 1112 09/01/15 1653 09/01/15 2208 09/02/15 0719 09/02/15 1141  GLUCAP 170* 240* 215* 193* 191*    Microbiology: Results for orders placed or performed during the hospital encounter of 08/24/15  CULTURE, BLOOD (ROUTINE X 2) w Reflex to PCR ID Panel     Status: None   Collection Time: 08/26/15  1:47 PM  Result Value Ref Range Status   Specimen Description BLOOD RIGHT HAND  Final   Special Requests   Final    BOTTLES DRAWN AEROBIC AND ANAEROBIC 2CCAEROB,2CCANA   Culture NO GROWTH 5 DAYS  Final   Report Status 08/31/2015 FINAL  Final  CULTURE, BLOOD (ROUTINE X 2) w Reflex to PCR ID Panel     Status: None   Collection Time: 08/26/15  1:56 PM  Result Value Ref Range Status   Specimen Description BLOOD LEFT HAND  Final   Special Requests  Final    BOTTLES DRAWN AEROBIC AND ANAEROBIC 2CCAEROB,1CCANA   Culture NO GROWTH 5 DAYS  Final   Report Status 08/31/2015 FINAL  Final    Coagulation Studies: No results for input(s): LABPROT, INR in the last 72 hours.  Urinalysis: No results for input(s): COLORURINE, LABSPEC, PHURINE, GLUCOSEU, HGBUR, BILIRUBINUR, KETONESUR, PROTEINUR, UROBILINOGEN, NITRITE, LEUKOCYTESUR in the last 72 hours.  Invalid input(s): APPERANCEUR    Imaging: No results found.   Medications:     . aspirin EC  81 mg Oral Daily  . atorvastatin  40 mg Oral q1800  . heparin subcutaneous  5,000 Units Subcutaneous 3 times per day  . Influenza vac split quadrivalent PF  0.5 mL Intramuscular Tomorrow-1000  . insulin aspart  0-15 Units Subcutaneous TID WC  . insulin  glargine  15 Units Subcutaneous QHS  . meclizine  12.5 mg Oral TID  . metoprolol succinate  50 mg Oral Daily  . pantoprazole  40 mg Oral Daily  . senna-docusate  2 tablet Oral Daily  . sodium chloride  3 mL Intravenous Q12H  . tamsulosin  0.4 mg Oral Daily   sodium chloride, acetaminophen **OR** acetaminophen, hydrALAZINE, ipratropium-albuterol, oxyCODONE-acetaminophen, polyethylene glycol, sodium chloride  Assessment/ Plan:  Kenneth [name] is a 82 y.o. white male diabetes mellitus type II, hypertension, hyperlipidemia, coronary artery disease, Allergic rhinitis, osteoarthritis, polymyalgia rheumatica, obstructive sleep apnea, who was admitted to [facility] on 08/24/2015 for NSTEMI (non-ST elevated myocardial infarction) (Ruth) [I21.4]  1. Acute renal failure on CKD st 4 with metabolic acidosis, hematuria and proteinuria: baseline creatinine of 2.14, eGFR of 26 on 08/25/15.  Acute renal failure seems to be due to ATN. Lisinopril, hypotension and vancomycin   Chronic kidney disease secondary to diabetic nephropathy Renal function is worsening.  - DDX includes ATN, secondary to infection vs vanc toxicity - vasculitis- discussed with family. Biopsy too risky. Will order serologies. ANA is negative - small M spike noted On SPEP. - dialysis did help improvement in his mental status - Another treatment today. - Reassess for need for further dialysis on a daily basis  2. Proteinuria - Urine protein/creatinine ratio is 1.8 g   3.  Diabetes Mellitus type II with chronic kidney disease: insulin dependent - holding metformin. Do not recommend restarting this medication - hemoglobin A1c of 7.6% on admission,    4. Anemia of chronic kidney disease: hemoglobin of 10.3. Normocytic  - has not received epo.      LOS: 9 Kenneth [name] 1/11/20175:29 PM

## 2015-09-02 NOTE — Progress Notes (Signed)
Pts daughter [name] called back to the hospital.  Given update on her father.  Would like to be called if he is having another HD treatment and what time.  [phone] Louis MeckelMcRae, Ralf Konopka H

## 2015-09-02 NOTE — Care Management Important Message (Signed)
Important Message  Patient Details  Name: Kenneth [name] T Kristensen MRN: [id number] Date of Birth: September 06, 1927   Medicare Important Message Given:  Yes    Olegario MessierKathy A Helton Oleson 09/02/2015, 10:32 AM

## 2015-09-02 NOTE — Progress Notes (Signed)
Clinical Child psychotherapistocial Worker ([name]) contacted [employer] [facility] liaison and gave him an update. It is undetermined if patient will need acute or chronic dialysis. [name] will continue to follow and assist as needed.   Jetta LoutBailey Morgan, LCSWA [phone]

## 2015-09-02 NOTE — Progress Notes (Signed)
PT Cancellation Note  Patient Details Name: Kenneth [name] MRN: [id number] DOB: 02-18-1928   Cancelled Treatment:    Reason Eval/Treat Not Completed: Patient at procedure or test/unavailable (Treatment session attempted; patient currently off unit for dialysis.  Will re-attempt at later time/date as patient available and medically appropriate.)   Tenley Winward H. Manson PasseyBrown, PT, DPT, NCS 09/02/2015, 3:13 PM [phone]

## 2015-09-02 NOTE — Progress Notes (Signed)
[facility] - [place] at [facility]   PATIENT NAME: Kenneth [name]    MR#:  [id number]  DATE OF BIRTH:  13-Feb-1928  SUBJECTIVE:  CHIEF COMPLAINT:   Chief Complaint  Patient presents with  . Chest Pain   Some cough. S/p permacath- 09/01/15- generalized weak,  no new complains.  s/p HD 09/01/15, much more alert and oriented today.   REVIEW OF SYSTEMS:  Review of Systems  Constitutional: Negative for fever and chills.  HENT: Negative for ear pain, nosebleeds and tinnitus.   Eyes: Negative for blurred vision.  Respiratory: Negative for cough, shortness of breath and wheezing.   Cardiovascular: Negative for chest pain, palpitations and leg swelling.  Gastrointestinal: Negative for nausea, vomiting, abdominal pain, diarrhea and constipation.  Genitourinary: Negative for dysuria, urgency and frequency.  Musculoskeletal: Negative for myalgias, back pain and neck pain.  Neurological: Positive for dizziness and weakness. Negative for tingling, tremors, sensory change, focal weakness, seizures and headaches.    DRUG ALLERGIES:   Allergies  Allergen Reactions  . Beef-Derived Products Hives  . Iodine Hives  . Ivp Dye [Iodinated Diagnostic Agents] Hives    VITALS:  Blood pressure 117/53, pulse 81, temperature 97.9 F (36.6 C), temperature source Oral, resp. rate 24, height 5\' 6"  (1.676 m), weight 81.602 kg (179 lb 14.4 oz), SpO2 99 %.  PHYSICAL EXAMINATION:  Physical Exam  GENERAL:  82 y.o.-year-old patient lying in the bed in no distress  EYES: Pupils equal, round, reactive to light and accommodation. No scleral icterus. Extraocular muscles intact.  HEENT: Head atraumatic, normocephalic. Oropharynx and nasopharynx clear.  NECK:  Supple, no jugular venous distention. No thyroid enlargement, no tenderness. Permacath in upper chest present. LUNGS: Normal breath sounds bilaterally, no wheezing, rales,rhonchi or crepitation. Minimal use of accessory muscles on  exertion. Decreased basilar breath sounds. Scattered rhonchi heard at the bases posteriorly CARDIOVASCULAR: S1, S2 normal. No rubs, or gallops. 3/6 systolic murmur is present ABDOMEN: Soft, nontender, nondistended. Bowel sounds present. No organomegaly or mass.  EXTREMITIES: No pedal edema, cyanosis, or clubbing.  NEUROLOGIC: Cranial nerves II through XII are intact. Muscle strength 4/5 in all extremities. Generalized weakness is present Sensation intact. Gait not checked.  PSYCHIATRIC: The patient is alert and oriented x 2.  SKIN: No obvious rash, lesion, or ulcer.    LABORATORY PANEL:   CBC  Recent Labs Lab 08/31/15 0425  WBC 10.4  HGB 10.3*  HCT 30.6*  PLT 491*   ------------------------------------------------------------------------------------------------------------------  Chemistries   Recent Labs Lab 09/02/15 0551  NA 140  K 4.7  CL 102  CO2 28  GLUCOSE 212*  BUN 87*  CREATININE 6.87*  CALCIUM 7.7*   ------------------------------------------------------------------------------------------------------------------  Cardiac Enzymes No results for input(s): TROPONINI in the last 168 hours. ------------------------------------------------------------------------------------------------------------------  RADIOLOGY:  No results found.   ASSESSMENT AND PLAN:   Kenneth [name] is a 82 y.o. male with a known history of diabetes, hypertension, coronary disease status post CABG in the past comes to the emergency room with complaints of chest pain nonradiating with some nausea. Had gradual worsening in renal function.  1. 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 No nephrotoxins administered except vancomycin for 2 days.  No imaging or contrast exposure. He was hypotensive briefly. His UA is showing too numerous to count red blood cells and few WBCs but no nitrates.  He has a history of nephrolithiasis but does not  have any pain. Renal ultrasound  showing no calculi or hydronephrosis. Nephrology consultation appreciated. Gradual worsening renal function. Still have satisfactory urine output. Have some hematuria. Dr. Thedore Minssingh order blood and urine work ups for vasculitis. Also ordered vascular for Dialysis catheter placement. Starting on HD 09/01/15.  2. Altered mental status-improved  Likely metabolic encephalopathy now resolved -Fever on 1/4, isolated, has not recurred. Blood cultures NTD. Possible bronchitis. Chest x-ray clear. Patient has cough and some shortness of breath. Empirically on Vanco and Zosyn, changed to azithromycin.( He received 2 dose of vanc on admission)  VQ scan negative for PE  Stopped ABx after finishing for 7 days.  Had some more confusion- resolved after having HD for one session.     3. Dizziness-subacute.  improved  Seen by ENT as an outpatient. Started on meclizine with minimal help. -Discontinue Valium due to mental status changes from it. -MRI of the brain and MRA of the head and neck without any acute findings -Physical therapy consult appreciated. Appreciate neurology consult. -Vestibular exercises and therapy recommended.   Need SNF rehab.  4. Elevated troponin with history of coronary artery disease status post CABG in the remote past. Likely demand ischemia.  Appreciate Cardiology consultation, no further interventions Continue cardiac medications.  5. Hypertension- hold lisinopril for now given renal failure  6. Type 2 diabetes- sugars are elevated -continue glipizide. -Hold metformin given his CKD. On SSI -Added Lantus   7. DVT Prophylaxis- on subcutaneous heparin  8. Chest pain   Likely pleuritic due to bronchitis.    percocet for pain .  All the records are reviewed and case discussed with Care Management/Social Workerr. Management plans discussed with the patient, family and they are in agreement. Discussed care plan with his daughter at the bedside  today  CODE STATUS: Full code  TOTAL CRITICAL CARE TIME spent in TAKING CARE OF THIS PATIENT: 40 minutes.   POSSIBLE D/C IN 1-2 DAYS, DEPENDING ON CLINICAL CONDITION.  Need SNF placement.  Nephrology to decide further need for HD.  Altamese DillingVACHHANI, Ivyanna Sibert M.D on 09/02/2015 at 11:20 PM  Between 7am to 6pm - Pager - [phone]  After 6pm go to [email or web address] - password [email or web address] [facility]  [facility]  Office  [phone]  CC: Primary care physician; Barbette ReichmannHANDE,VISHWANATH, MD

## 2015-09-02 NOTE — Progress Notes (Signed)
°   09/02/15 1300  Clinical Encounter Type  Visited With Patient and family together;Health care provider;Other (Comment)  Visit Type Spiritual support  Referral From Nurse  Consult/Referral To Chaplain  Spiritual Encounters  Spiritual Needs Prayer;Emotional  Stress Factors  Patient Stress Factors Health changes  Family Stress Factors Not reviewed  Chaplain responded to page in unit.  Offered a compassionate presence and support as needed. Chaplain [employer]elyn Crews Ext [phone]

## 2015-09-02 NOTE — Progress Notes (Signed)
Pt has removed mittens.  New IV site wrapped and secured.  Pt given a bath.  Pt also requested to speak with his wife or daughter.  Daughter [name] had called previously in shift- she wanted to be the one notified during the night.  RN left message for Mr Felisa Bonierickards daugther that this was a nonemergent call- he just wanted to visit with her before she came to the hospital.  Will continue to monitor. Louis MeckelMcRae, Toben Acuna H

## 2015-09-02 NOTE — Progress Notes (Addendum)
Pt confused and impulsive during shift.  IV was wrapped and secured.  Pt pulled out IV and continually was pulling off telemetry leads.  Pt started to mess with perm cath lines- mitts were put in place--[facility]--pt agreeable to wearing mitts at this time. Will assess need for mitts later in shift.  Will continue to monitor.  Bed alarm in sensitive setting. Louis MeckelMcRae, Oscar Forman H

## 2015-09-02 NOTE — Progress Notes (Signed)
Nutrition Follow-up    INTERVENTION:   Meals and Snacks: Cater to patient preferences   NUTRITION DIAGNOSIS:   Reassess on follow-up  GOAL:   Patient will meet greater than or equal to 90% of their needs   MONITOR:    (Energy Intake, Anthropometrics, Digestive System, Electrolyte/Renal Profile)  REASON FOR ASSESSMENT:   Consult  (poor dietary habits)  ASSESSMENT:   Kenneth [name] with worsening renal failure s/p HD cath placement with initiation of HD yesterday, down for 2nd HD treatment on visit today; Kenneth [name] with some confusion. Per documentation, unsure if need for dialysis is acute or chronic  Diet Order:  Diet renal with fluid restriction Fluid restriction:: 1200 mL Fluid; Room service appropriate?: Yes; Fluid consistency:: Thin   Energy Intake: recorded po intake 72% of meals on average  Electrolyte and Renal Profile:  Recent Labs Lab 08/30/15 0427 08/31/15 0425 09/02/15 0551  BUN 94* 99* 87*  CREATININE 6.84* 7.46* 6.87*  NA 139 140 140  K 4.7 4.6 4.7  PHOS  --  4.7*  --    Glucose Profile:   Recent Labs  09/01/15 2208 09/02/15 0719 09/02/15 1141  GLUCAP 215* 193* 191*   Meds: ss novolog, lantus  Height:   Ht Readings from Last 1 Encounters:  08/24/15 5\' 6"  (1.676 m)    Weight:   Wt Readings from Last 1 Encounters:  09/02/15 183 lb 10.3 oz (83.3 kg)    Filed Weights   09/01/15 2102 09/02/15 0608 09/02/15 1500  Weight: 183 lb 11.2 oz (83.326 kg) 180 lb (81.647 kg) 183 lb 10.3 oz (83.3 kg)    BMI:  Body mass index is 29.65 kg/(m^2).  Estimated Nutritional Needs:   Kcal:  1820-2149 kcals (BEE 1272, 1.3 AF, 1.1-1.3 IF) using IBW 65 kg  Protein:  72-91 g (1.2-1.4 g/kg)   Fluid:  1000 ml plus UOP    MODERATE Care Level  [place] [facility]oscopy[facility] Alexandros Ewan MS, RD, LDN [phone] Pager  [phone] Weekend/On-Call Pager

## 2015-09-02 NOTE — Progress Notes (Signed)
Pastoral Care provided for patient and family. °

## 2015-09-03 LAB — HEPATITIS B CORE ANTIBODY, TOTAL: HEP B C TOTAL AB: NEGATIVE

## 2015-09-03 LAB — MPO/PR-3 (ANCA) ANTIBODIES: Myeloperoxidase Abs: <9 U/mL (ref 0.0–9.0)

## 2015-09-03 LAB — PROTEIN ELECTRO, RANDOM URINE
ALBUMIN ELP UR: 34.1 %
Alpha-1-Globulin, U: 1.1 %
Alpha-2-Globulin, U: 2.7 %
Beta Globulin, U: 23.9 %
Gamma Globulin, U: 38.1 %
M SPIKE UR: 10.5 % — AB
TOTAL PROTEIN, URINE-UPE24: 74.1 mg/dL

## 2015-09-03 LAB — GLUCOSE, CAPILLARY
GLUCOSE-CAPILLARY: 139 mg/dL — AB (ref 65–99)
Glucose-Capillary: 217 mg/dL — ABNORMAL HIGH (ref 65–99)
Glucose-Capillary: 146 mg/dL — ABNORMAL HIGH (ref 65–99)

## 2015-09-03 LAB — HEPATITIS C ANTIBODY

## 2015-09-03 LAB — HEPATITIS B SURFACE ANTIBODY,QUALITATIVE: HEP B S AB: NONREACTIVE

## 2015-09-03 LAB — ANCA TITERS: C-ANCA: <1:20 {titer}

## 2015-09-03 MED ORDER — METOPROLOL SUCCINATE ER 50 MG PO TB24
75.0000 mg | ORAL_TABLET | Freq: Every day | ORAL | Status: DC
  Administered 2015-09-04: 75 mg via ORAL
  Filled 2015-09-03: qty 1

## 2015-09-03 MED ORDER — METOPROLOL SUCCINATE ER 25 MG PO TB24
25.0000 mg | ORAL_TABLET | Freq: Every day | ORAL | Status: AC
  Administered 2015-09-03: 25 mg via ORAL
  Filled 2015-09-03: qty 1

## 2015-09-03 NOTE — Progress Notes (Signed)
Pt calm, cooperative this shift.  Slept at short intervals.  Voiding without difficulty.  No attempts to remove IV or permcath noticed this shift.

## 2015-09-03 NOTE — Progress Notes (Signed)
    Patient with 7 beats of NSVT on telemetry. Can increase Troprol XL to 75 mg daily. Should he experience further ectopy could consider amiodarone, though he has been asymptomatic thus far.    Eula Listenyan Dunn, PA-C 09/03/2015 12:32 PM

## 2015-09-03 NOTE — Progress Notes (Signed)
Physical Therapy Treatment Patient Details Name: Kenneth [name] T Penick MRN: [id number] DOB: 1928/06/14 Today's Date: 09/03/2015    History of Present Illness presented to ER secondary to chest pain, nausea, LE weakness status post fall in home environment; admitted with possible NSTEMI (troponin peak 0.17), initially managed with heparin drip (now discontinued).  Of note, patient recently being treated for vertigo by ENT (x3 weeks) without improvement in symptoms.    PT Comments    Patient now with R chest perm cath, status post dialysis x2; nephrology monitoring for continued need for dialysis on daily basis. Patient with significant improvement in alertness, orientation this date; able to complete all mobility with RW, cga/min assist; progressing distance and overall activity tolerance appropriately. Continue to recommend transition to STR to address persistent cardiopulmonary/activity tolerance deficits, LE strength and balance deficits.   Follow Up Recommendations  SNF     Equipment Recommendations       Recommendations for Other Services       Precautions / Restrictions Precautions Precautions: Fall Precaution Comments: R chest permcath Restrictions Weight Bearing Restrictions: No    Mobility  Bed Mobility Overal bed mobility: Modified Independent Bed Mobility: Supine to Sit     Supine to sit: Supervision     General bed mobility comments: cuing for use of bedrails to assist as needed  Transfers Overall transfer level: Needs assistance Equipment used: Rolling walker (2 wheeled) Transfers: Sit to/from Stand Sit to Stand: Min guard         General transfer comment: tends to pull on RW; able to complete without UE support, but requires multiple attempts and use of LEs against edge of bed surface to complete  Ambulation/Gait Ambulation/Gait assistance: Min assist Ambulation Distance (Feet): 220 Feet Assistive device: Rolling walker (2 wheeled)   Gait velocity:  10' walk time, 10-11 seconds   General Gait Details: reciprocal stepping pattern, but with inconsistent step height/length; veers laterally with head turns or divided attention, requiring cuing from therapist for correction   Stairs            Wheelchair Mobility    Modified Rankin (Stroke Patients Only)       Balance Overall balance assessment: Needs assistance Sitting-balance support: No upper extremity supported;Feet supported Sitting balance-Leahy Scale: Good     Standing balance support: Bilateral upper extremity supported Standing balance-Leahy Scale: Fair                      Cognition Arousal/Alertness: Awake/alert Behavior During Therapy: WFL for tasks assessed/performed Overall Cognitive Status: Within Functional Limits for tasks assessed                      Exercises Other Exercises Other Exercises: Standing LE therex, 1x10, with RW, cga/min assist: heel raises, mini squats, marching, hip flex/ext/abduct/adduct.  Intermittent seated rest period due to fatigue. Other Exercises: 5x sit/stand without assist device, cga/clsoe sup: 35 seconds; significantly below norms and indicative of increased fall risk/decreased muscular strength/power with functional activities.    General Comments        Pertinent Vitals/Pain Pain Assessment: No/denies pain    Home Living                      Prior Function            PT Goals (current goals can now be found in the care plan section) Acute Rehab PT Goals Patient Stated Goal: patient unable to verbalize; per wife,  to consider rehab prior to return home PT Goal Formulation: With patient/family Time For Goal Achievement: 09/08/15 Potential to Achieve Goals: Good Progress towards PT goals: Progressing toward goals    Frequency  Min 2X/week    PT Plan Current plan remains appropriate    Co-evaluation             End of Session Equipment Utilized During Treatment: Gait  belt Activity Tolerance: Patient tolerated treatment well Patient left: in bed;with call bell/phone within reach;with bed alarm set;with family/visitor present     Time: 5009-3818 PT Time Calculation (min) (ACUTE ONLY): 23 min  Charges:  $Gait Training: 8-22 mins $Therapeutic Exercise: 8-22 mins                    G Codes:      Seva Chancy H. Manson Passey, PT, DPT, NCS 09/03/2015, 4:35 PM [phone]

## 2015-09-03 NOTE — Progress Notes (Signed)
[facility] Physicians - [place] at [facility]   PATIENT NAME: Johntay Doolen    MR#:  [id number]  DATE OF BIRTH:  Apr 29, 1928  SUBJECTIVE:  CHIEF COMPLAINT:  Pt had 3 episodes of V tach 6sec, 9 sec and 18 sec , but denies any symptoms. Afebrile ON  REVIEW OF SYSTEMS:  CONSTITUTIONAL: No fever, fatigue or weakness.  EYES: No blurred or double vision.  EARS, NOSE, AND THROAT: No tinnitus or ear pain.  RESPIRATORY: No cough, shortness of breath, wheezing or hemoptysis.  CARDIOVASCULAR: No chest pain, orthopnea, edema.  GASTROINTESTINAL: No nausea, vomiting, diarrhea or abdominal pain.  GENITOURINARY: No dysuria, hematuria.  ENDOCRINE: No polyuria, nocturia,  HEMATOLOGY: No anemia, easy bruising or bleeding SKIN: No rash or lesion. MUSCULOSKELETAL: No joint pain or arthritis.   NEUROLOGIC: No tingling, numbness, weakness.  PSYCHIATRY: No anxiety or depression.   DRUG ALLERGIES:   Allergies  Allergen Reactions  . Beef-Derived Products Hives  . Iodine Hives  . Ivp Dye [Iodinated Diagnostic Agents] Hives    VITALS:  Blood pressure 136/75, pulse 92, temperature 98.2 F (36.8 C), temperature source Oral, resp. rate 24, height 5\' 6"  (1.676 m), weight 81.602 kg (179 lb 14.4 oz), SpO2 93 %.  PHYSICAL EXAMINATION:  GENERAL:  82 y.o.-year-old patient lying in the bed with no acute distress.  EYES: Pupils equal, round, reactive to light and accommodation. No scleral icterus. Extraocular muscles intact.  HEENT: Head atraumatic, normocephalic. Oropharynx and nasopharynx clear.  NECK:  Supple, no jugular venous distention. No thyroid enlargement, no tenderness.  LUNGS: Normal breath sounds bilaterally, no wheezing, rales,rhonchi or crepitation. No use of accessory muscles of respiration.  CARDIOVASCULAR: S1, S2 normal. No murmurs, rubs, or gallops.  ABDOMEN: Soft, nontender, nondistended. Bowel sounds present. No organomegaly or mass.  EXTREMITIES: No pedal edema, cyanosis,  or clubbing.  NEUROLOGIC: Cranial nerves II through XII are intact. Muscle strength 5/5 in all extremities. Sensation intact. Gait not checked.  PSYCHIATRIC: The patient is alert and oriented x 3.  SKIN: No obvious rash, lesion, or ulcer.    LABORATORY PANEL:   CBC  Recent Labs Lab 08/31/15 0425  WBC 10.4  HGB 10.3*  HCT 30.6*  PLT 491*   ------------------------------------------------------------------------------------------------------------------  Chemistries   Recent Labs Lab 09/02/15 0551  NA 140  K 4.7  CL 102  CO2 28  GLUCOSE 212*  BUN 87*  CREATININE 6.87*  CALCIUM 7.7*   ------------------------------------------------------------------------------------------------------------------  Cardiac Enzymes No results for input(s): TROPONINI in the last 168 hours. ------------------------------------------------------------------------------------------------------------------  RADIOLOGY:  No results found.  EKG:   Orders placed or performed during the hospital encounter of 08/24/15  . ED EKG  . ED EKG  . EKG 12-Lead  . EKG 12-Lead    ASSESSMENT AND PLAN:   Khristian Phillippi is a 82 y.o. male with a known history of diabetes, hypertension, coronary disease status post CABG in the past comes to the emergency room with complaints of chest pain nonradiating with some nausea. Had gradual worsening in renal function.  1. Acute on CKD-IV: 2/2 vanc/ infection   Baseline creatinine around 2., --6.87 Continue  IV fluids.  No nephrotoxins administered except vancomycin for 2 days. Renal ultrasound showing no calculi or hydronephrosis.  Nephrology recommendations appreciated. F/u  blood and urine work ups for vasculitis,ordered by nephro Vascular sx placed  HD  catheter . Started HD 09/01/15. D/w nephrology, currently not considering HD , plan is to monitor renal fx closely   2. Altered mental status  2/2 Metabolic encephalopathy prob 2/2 a bronchitis -Fever  on 1/4, only once , has not recurred. Blood cultures NTD. Empirically on Vanco and Zosyn, changed to azithromycin.( He received 2 dose of vanc on admission) . D/ced abx after 7 day course VQ scan negative for PE .   3. Dizziness-subacute. improved  Seen by ENT as an outpatient. Started on meclizine with minimal help. -Discontinued Valium due to mental status changes from it. -MRI of the brain and MRA of the head and neck with no  acute findings -Physical therapy consult appreciated. Appreciate neurology consult. -Vestibular exercises and therapy recommended.  Need SNF for  Rehab at d/c  4. Elevated troponin with history of coronary artery disease status post CABG in the remote past. Likely demand ischemia. Appreciate Cardiology consultation, no further interventions Continue cardiac medications.  5. Hypertension- hold lisinopril for now given renal failure  6. Type 2 diabetes- sugars are elevated -continue glipizide. -Hold metformin given his CKD. On SSI -continue Lantus   7. DVT Prophylaxis- on subcutaneous heparin      All the records are reviewed and case discussed with Care Management/Social Workerr. Management plans discussed with the patient, family and they are in agreement.  CODE STATUS: fc  TOTAL TIME TAKING CARE OF THIS PATIENT: 35 minutes.   POSSIBLE D/C IN 2-3 DAYS, DEPENDING ON CLINICAL CONDITION.   Ramonita LabGouru, Kayo Zion M.D on 09/03/2015 at 8:35 PM  Between 7am to 6pm - Pager - [phone] After 6pm go to [email or web address] - password [email or web address]  [facility] [place]  Office  [phone]  CC: Primary care physician; Barbette ReichmannHANDE,VISHWANATH, MD

## 2015-09-03 NOTE — Progress Notes (Signed)
Md notified. Pt has 2 runs of vtach. Orders to notify cardiologist following pt. Ryan Dunn notified of pt's vtach. He will round on patient. I will continue to assess.

## 2015-09-03 NOTE — Progress Notes (Signed)
[facility]  ROUNDING NOTE   Subjective:  Late entry:Patient underwent 2nd hemodialysis treatment last night.  Feels much better this morning.  More alert and oriented. No acute shortness of breath Hematuria noted ANA is neg Appetite remains poor     Objective:  Vital signs in last 24 hours:  Temp:  [97.6 F (36.4 C)-98.4 F (36.9 C)] 98.4 F (36.9 C) (01/12 1125) Pulse Rate:  [81-95] 88 (01/12 1125) Resp:  [16-24] 20 (01/12 0408) BP: (116-159)/(49-75) 116/49 mmHg (01/12 1125) SpO2:  [90 %-99 %] 91 % (01/12 1125) Weight:  [81.602 kg (179 lb 14.4 oz)-83.3 kg (183 lb 10.3 oz)] 81.602 kg (179 lb 14.4 oz) (01/11 1937)  Weight change: -1 kg (-2 lb 3.3 oz) Filed Weights   09/02/15 1500 09/02/15 1742 09/02/15 1937  Weight: 83.3 kg (183 lb 10.3 oz) 83.3 kg (183 lb 10.3 oz) 81.602 kg (179 lb 14.4 oz)    Intake/Output: I/O last 3 completed shifts: In: 480 [P.O.:480] Out: 1450 [Urine:950; Other:500]   Intake/Output this shift:  Total I/O In: 600 [P.O.:600] Out: 2 [Urine:1; Stool:1]  Physical Exam: General: NAD, sitting up in bed  Head: Normocephalic, atraumatic. Moist oral mucosal membranes  Eyes: Anicteric,   Neck: Supple, trachea midline  Lungs:  Clear to auscultation  Heart: Regular rate and rhythm  Abdomen:  Soft, nontender,   Extremities: no peripheral edema.  Neurologic: Nonfocal, moving all four extremities  Skin: No lesions       Basic Metabolic Panel:  Recent Labs Lab 08/28/15 0528 08/30/15 0427 08/31/15 0425 09/02/15 0551  NA 139 139 140 140  K 4.8 4.7 4.6 4.7  CL 107 109 106 102  CO2 24 17* 25 28  GLUCOSE 165* 166* 212* 212*  BUN 69* 94* 99* 87*  CREATININE 4.60* 6.84* 7.46* 6.87*  CALCIUM 8.4* 8.1* 8.0* 7.7*  PHOS  --   --  4.7*  --     Liver Function Tests:  Recent Labs Lab 08/31/15 0425  ALBUMIN 2.3*   No results for input(s): LIPASE, AMYLASE in the last 168 hours. No results for input(s): AMMONIA in the last 168  hours.  CBC:  Recent Labs Lab 08/28/15 0528 08/30/15 0427 08/31/15 0425  WBC 6.5 10.5 10.4  HGB 10.2* 10.0* 10.3*  HCT 30.9* 30.2* 30.6*  MCV 93.7 94.7 94.3  PLT 281 400 491*    Cardiac Enzymes: No results for input(s): CKTOTAL, CKMB, CKMBINDEX, TROPONINI in the last 168 hours.  BNP: Invalid input(s): POCBNP  CBG:  Recent Labs Lab 09/02/15 0719 09/02/15 1141 09/02/15 1759 09/02/15 2106 09/03/15 0800  GLUCAP 193* 191* 108* 217* 139*    Microbiology: Results for orders placed or performed during the hospital encounter of 08/24/15  CULTURE, BLOOD (ROUTINE X 2) w Reflex to PCR ID Panel     Status: None   Collection Time: 08/26/15  1:47 PM  Result Value Ref Range Status   Specimen Description BLOOD RIGHT HAND  Final   Special Requests   Final    BOTTLES DRAWN AEROBIC AND ANAEROBIC 2CCAEROB,2CCANA   Culture NO GROWTH 5 DAYS  Final   Report Status 08/31/2015 FINAL  Final  CULTURE, BLOOD (ROUTINE X 2) w Reflex to PCR ID Panel     Status: None   Collection Time: 08/26/15  1:56 PM  Result Value Ref Range Status   Specimen Description BLOOD LEFT HAND  Final   Special Requests   Final    BOTTLES DRAWN AEROBIC AND ANAEROBIC 2CCAEROB,1CCANA  Culture NO GROWTH 5 DAYS  Final   Report Status 08/31/2015 FINAL  Final    Coagulation Studies: No results for input(s): LABPROT, INR in the last 72 hours.  Urinalysis: No results for input(s): COLORURINE, LABSPEC, PHURINE, GLUCOSEU, HGBUR, BILIRUBINUR, KETONESUR, PROTEINUR, UROBILINOGEN, NITRITE, LEUKOCYTESUR in the last 72 hours.  Invalid input(s): APPERANCEUR    Imaging: No results found.   Medications:     . aspirin EC  81 mg Oral Daily  . atorvastatin  40 mg Oral q1800  . heparin subcutaneous  5,000 Units Subcutaneous 3 times per day  . Influenza vac split quadrivalent PF  0.5 mL Intramuscular Tomorrow-1000  . insulin aspart  0-15 Units Subcutaneous TID WC  . insulin glargine  15 Units Subcutaneous QHS  .  meclizine  12.5 mg Oral TID  . [START ON 09/04/2015] metoprolol succinate  75 mg Oral Daily  . pantoprazole  40 mg Oral Daily  . senna-docusate  2 tablet Oral Daily  . sodium chloride  3 mL Intravenous Q12H  . tamsulosin  0.4 mg Oral Daily   sodium chloride, acetaminophen **OR** acetaminophen, hydrALAZINE, ipratropium-albuterol, oxyCODONE-acetaminophen, polyethylene glycol, sodium chloride  Assessment/ Plan:  [name] is a 82 y.o. white male diabetes mellitus type II, hypertension, hyperlipidemia, coronary artery disease, Allergic rhinitis, osteoarthritis, polymyalgia rheumatica, obstructive sleep apnea, who was admitted to [facility] on 08/24/2015 for NSTEMI (non-ST elevated myocardial infarction) ([place]) [I21.4]  1. Acute renal failure on CKD st 4 with metabolic acidosis, hematuria and proteinuria: baseline creatinine of 2.14, eGFR of 26 on 08/25/15.  Acute renal failure seems to be due to ATN. Lisinopril, hypotension and vancomycin   Chronic kidney disease secondary to diabetic nephropathy Renal function is worsening.  - DDX includes ATN, secondary to infection vs vanc toxicity - vasculitis- discussed with family. Biopsy too risky. Will order serologies. ANA is negative - small M spike noted On SPEP. - dialysis did help improvement in his mental status. 2 treatments so far  - urine output 650 cc Plan is to hold further dialysis.  Monitor urine output and renal function along with electrolytes - reassess need for dialysis based on daily assessment  2. Proteinuria - Urine protein/creatinine ratio is 1.8 g   3.  Diabetes Mellitus type II with chronic kidney disease: insulin dependent - holding metformin. Do not recommend restarting this medication - hemoglobin A1c of 7.6% on admission,         LOS: 10 Kenneth [name] 1/12/20173:42 PM

## 2015-09-04 ENCOUNTER — Telehealth: Payer: Self-pay

## 2015-09-04 LAB — BASIC METABOLIC PANEL
Anion gap: 8 (ref 5–15)
BUN: 56 mg/dL — AB (ref 6–20)
CALCIUM: 7.4 mg/dL — AB (ref 8.9–10.3)
CO2: 29 mmol/L (ref 22–32)
CREATININE: 6.14 mg/dL — AB (ref 0.61–1.24)
Chloride: 100 mmol/L — ABNORMAL LOW (ref 101–111)
GFR calc non Af Amer: 7 mL/min — ABNORMAL LOW (ref >60)
GFR, EST AFRICAN AMERICAN: 9 mL/min — AB (ref >60)
Glucose, Bld: 150 mg/dL — ABNORMAL HIGH (ref 65–99)
Potassium: 3.7 mmol/L (ref 3.5–5.1)
SODIUM: 137 mmol/L (ref 135–145)

## 2015-09-04 LAB — CBC WITH DIFFERENTIAL/PLATELET
BASOS PCT: 1 %
Basophils Absolute: 0.1 10*3/uL (ref 0–0.1)
EOS ABS: 0.2 10*3/uL (ref 0–0.7)
EOS PCT: 2 %
HCT: 27.7 % — ABNORMAL LOW (ref 40.0–52.0)
Hemoglobin: 9.3 g/dL — ABNORMAL LOW (ref 13.0–18.0)
Lymphocytes Relative: 14 %
Lymphs Abs: 1 10*3/uL (ref 1.0–3.6)
MCH: 31.3 pg (ref 26.0–34.0)
MCHC: 33.7 g/dL (ref 32.0–36.0)
MCV: 92.9 fL (ref 80.0–100.0)
MONO ABS: 0.5 10*3/uL (ref 0.2–1.0)
MONOS PCT: 8 %
NEUTROS PCT: 75 %
Neutro Abs: 5.1 10*3/uL (ref 1.4–6.5)
Platelets: 417 10*3/uL (ref 150–440)
RBC: 2.98 MIL/uL — ABNORMAL LOW (ref 4.40–5.90)
RDW: 13.8 % (ref 11.5–14.5)
WBC: 6.9 10*3/uL (ref 3.8–10.6)

## 2015-09-04 LAB — GLUCOSE, CAPILLARY
GLUCOSE-CAPILLARY: 192 mg/dL — AB (ref 65–99)
GLUCOSE-CAPILLARY: 138 mg/dL — AB (ref 65–99)
GLUCOSE-CAPILLARY: 152 mg/dL — AB (ref 65–99)
Glucose-Capillary: 231 mg/dL — ABNORMAL HIGH (ref 65–99)

## 2015-09-04 MED ORDER — TAMSULOSIN HCL 0.4 MG PO CAPS: 0.4000 mg | ORAL_CAPSULE | Freq: Every day | ORAL | Status: AC

## 2015-09-04 MED ORDER — ATORVASTATIN CALCIUM 40 MG PO TABS: 40.0000 mg | ORAL_TABLET | Freq: Every day | ORAL | Status: DC

## 2015-09-04 MED ORDER — SENNOSIDES-DOCUSATE SODIUM 8.6-50 MG PO TABS: 2.0000 | ORAL_TABLET | Freq: Every day | ORAL | Status: DC

## 2015-09-04 MED ORDER — INSULIN GLARGINE 100 UNIT/ML ~~LOC~~ SOLN: 15.0000 [IU] | Freq: Every day | SUBCUTANEOUS | Status: DC

## 2015-09-04 MED ORDER — OXYCODONE-ACETAMINOPHEN 5-325 MG PO TABS: 1.0000 | ORAL_TABLET | Freq: Four times a day (QID) | ORAL | Status: DC | PRN

## 2015-09-04 MED ORDER — INFLUENZA VAC SPLIT QUAD 0.5 ML IM SUSY: 0.5000 mL | PREFILLED_SYRINGE | INTRAMUSCULAR | Status: DC

## 2015-09-04 MED ORDER — IPRATROPIUM-ALBUTEROL 0.5-2.5 (3) MG/3ML IN SOLN: 3.0000 mL | RESPIRATORY_TRACT | Status: DC | PRN

## 2015-09-04 MED ORDER — PANTOPRAZOLE SODIUM 40 MG PO TBEC: 40.0000 mg | DELAYED_RELEASE_TABLET | Freq: Every day | ORAL | Status: DC

## 2015-09-04 MED ORDER — MECLIZINE HCL 12.5 MG PO TABS
12.5000 mg | ORAL_TABLET | Freq: Three times a day (TID) | ORAL | Status: DC
Start: 2015-09-04 — End: 2016-02-25

## 2015-09-04 MED ORDER — METOPROLOL SUCCINATE ER 25 MG PO TB24: 75.0000 mg | ORAL_TABLET | Freq: Every day | ORAL | Status: DC

## 2015-09-04 NOTE — Progress Notes (Signed)
Clinical Social Worker was informed by that patient will be medically ready to discharge to [facility]. Patient and his wife in a agreement with plan.[name] called [name], admissions coordinator at [facility] to confirm that patient's bed is ready. Provided patient's room number 612 and number to call for report Konrad DoloresKim Hicks [phone] . All discharge information faxed to [facility] via [facility]. Rx's added to discharge packet. Selena BattenKim, dialysis liaison reports that patient's dialysis chair [facility] is 2pm Wednesday [facility]  RN will call report and patient will discharge to [facility] via his wife Osvaldo AngstMary Storck.  Woodroe Modehristina Carmeron Heady, MSW, LCSW-A Clinical Social Work Department [phone]

## 2015-09-04 NOTE — Progress Notes (Signed)
Patient is discharge in a stable condition to [facility] report given to floor nurse Thayer Ohmhris , summary and f/u given to pt and family verbalized understanding,pt will be transported to [facility] by family

## 2015-09-04 NOTE — Telephone Encounter (Signed)
Attempted to [facility]. No answer, no machine at home # or mobile # x 3 attempts.

## 2015-09-04 NOTE — Care Management Important Message (Signed)
Important Message  Patient Details  Name: Kenneth [name] MRN: [id number] Date of Birth: 1928-03-11   Medicare Important Message Given:  Yes 2nd notice given    Berna BueCheryl Naoko Diperna, RN 09/04/2015, 8:52 AM

## 2015-09-04 NOTE — Progress Notes (Signed)
24 beat run NSVT noted on telemetry. Pt asleep in room.  Skin warm, dry.  Denies pain or SOB on awakening.  Dr. Sheryle Hailiamond on floor and notified. No new orders at this time.

## 2015-09-04 NOTE — Progress Notes (Signed)
Per [name] [facility] liaison they will not accept the patient unless he has a chair time for dialysis. Clinical Child psychotherapistocial Worker ([name]) contacted [facility] and made her aware of above. Per Selena BattenKim she will work on acute dialysis outpatient placement today. Per Selena BattenKim she is unsure if patient's insurance Medicare UHC will cover acute dialysis. Per Selena BattenKim [facility]. [name] will continue to follow and assist as needed.   Jetta LoutBailey Morgan, LCSWA [phone]

## 2015-09-04 NOTE — NC FL2 (Signed)
[place] MEDICAID [name] LEVEL OF CARE SCREENING TOOL     IDENTIFICATION  Patient Name: Kenneth [name] Birthdate: [birth year] Sex: male Admission Date (Current Location): 08/24/2015  [place]   Facility and Address:  [facility], [address], [place], [place] [address]      Provider Number: [id number]  Attending Physician Name and Address:  Ramonita LabAruna Diem Dicocco, MD  Relative Name and Phone Number:       Current Level of Care: Hospital Recommended Level of Care: Skilled Nursing Facility Prior Approval Number:    Date Approved/Denied:   PASRR Number:  [id number])  Discharge Plan: SNF    Current Diagnoses: Patient Active Problem List   Diagnosis Date Noted  . CKD (chronic kidney disease) stage 4, GFR 15-29 ml/min (HCC) 08/26/2015  . Sinus tachycardia (HCC) 08/26/2015  . Demand ischemia (HCC)   . Coronary artery disease involving coronary bypass graft of native heart without angina pectoris   . Ischemic chest pain (HCC)   . Acute non-ST elevation myocardial infarction (NSTEMI) (HCC) 08/24/2015  . NSTEMI (non-ST elevated myocardial infarction) (HCC)   . Angina pectoris (HCC)   . SOB (shortness of breath)   . Chronic diastolic CHF (congestive heart failure) (HCC)   . Centrilobular emphysema (HCC)   . Coronary artery disease involving coronary bypass graft of native heart with angina pectoris with documented spasm (HCC)   . Hyperlipidemia   . OSA on CPAP 08/19/2009  . DYSPNEA 07/17/2009  . SNORING 07/17/2009  . HYPERLIPIDEMIA-MIXED 07/02/2009  . HYPERTENSION, UNSPECIFIED 07/02/2009  . CORONARY ATHEROSCLEROSIS OF ARTERY BYPASS GRAFT 07/02/2009  . CARDIOMYOPATHY, ISCHEMIC 07/02/2009    Orientation RESPIRATION BLADDER Height & Weight    Self, Time, Situation, Place  O2 (2 Liters Oxygen ) Continent 5\' 6"  (167.6 cm) 178 lbs.  BEHAVIORAL SYMPTOMS/MOOD NEUROLOGICAL BOWEL NUTRITION STATUS   (none )  (none ) Continent Diet  (Diet: Heart Healthy/ Carb Modified. )  AMBULATORY STATUS COMMUNICATION OF NEEDS Skin   Extensive Assist Verbally Normal                       Personal Care Assistance Level of Assistance  Bathing, Feeding, Dressing Bathing Assistance: Limited assistance Feeding assistance: Independent Dressing Assistance: Limited assistance     Functional Limitations Info  Sight, Hearing, Speech Sight Info: Adequate Hearing Info: Adequate Speech Info: Adequate    SPECIAL CARE FACTORS FREQUENCY  PT (By licensed PT)     PT Frequency:  (5)              Contractures      Additional Factors Info  Code Status, Allergies Code Status Info:  (Full Code. ) Allergies Info:  (Beef-derived Products, Iodine, Ivp Dye Iodinated Diagnostic Agents)           Current Medications (09/04/2015):  This is the current hospital active medication list Current Facility-Administered Medications  Medication Dose Route Frequency Provider Last Rate Last Dose  . 0.9 %  sodium chloride infusion  250 mL Intravenous PRN Enedina FinnerSona Patel, MD 20 mL/hr at 09/01/15 0754 1,000 mL at 09/01/15 0754  . acetaminophen (TYLENOL) tablet 650 mg  650 mg Oral Q6H PRN Enedina FinnerSona Patel, MD   650 mg at 08/26/15 2044   Or  . acetaminophen (TYLENOL) suppository 650 mg  650 mg Rectal Q6H PRN Enedina FinnerSona Patel, MD      . aspirin EC tablet 81 mg  81 mg Oral Daily Ellsworth LennoxBrittany M Strader, [place]  81 mg at 09/04/15 0916  . atorvastatin (LIPITOR) tablet 40 mg  40 mg Oral q1800 Altamese Dilling, MD   40 mg at 09/03/15 1803  . heparin injection 5,000 Units  5,000 Units Subcutaneous 3 times per day Enid Baas, MD   5,000 Units at 09/04/15 1302  . hydrALAZINE (APRESOLINE) injection 10 mg  10 mg Intravenous Q6H PRN Altamese Dilling, MD   10 mg at 08/29/15 0920  . Influenza vac split quadrivalent PF (FLUARIX) injection 0.5 mL  0.5 mL Intramuscular Tomorrow-1000 Enedina Finner, MD   0.5 mL at 08/25/15 1802  . insulin aspart (novoLOG) injection 0-15 Units   0-15 Units Subcutaneous TID WC Enedina Finner, MD   5 Units at 09/04/15 1140  . insulin glargine (LANTUS) injection 15 Units  15 Units Subcutaneous QHS Altamese Dilling, MD   15 Units at 09/03/15 2226  . ipratropium-albuterol (DUONEB) 0.5-2.5 (3) MG/3ML nebulizer solution 3 mL  3 mL Nebulization Q4H PRN Enid Baas, MD   3 mL at 08/31/15 0312  . meclizine (ANTIVERT) tablet 12.5 mg  12.5 mg Oral TID Thana Farr, MD   12.5 mg at 09/04/15 1302  . metoprolol succinate (TOPROL-XL) 24 hr tablet 75 mg  75 mg Oral Daily Raymon Mutton Dunn, PA-C   75 mg at 09/04/15 0916  . oxyCODONE-acetaminophen (PERCOCET/ROXICET) 5-325 MG per tablet 1 tablet  1 tablet Oral Q6H PRN Altamese Dilling, MD   1 tablet at 08/30/15 1050  . pantoprazole (PROTONIX) EC tablet 40 mg  40 mg Oral Daily Enedina Finner, MD   40 mg at 09/04/15 0916  . polyethylene glycol (MIRALAX / GLYCOLAX) packet 17 g  17 g Oral Daily PRN Enid Baas, MD      . senna-docusate (Senokot-S) tablet 2 tablet  2 tablet Oral Daily Enid Baas, MD   2 tablet at 09/04/15 0916  . sodium chloride 0.9 % injection 3 mL  3 mL Intravenous Q12H Enedina Finner, MD   3 mL at 09/04/15 0919  . sodium chloride 0.9 % injection 3 mL  3 mL Intravenous PRN Enedina Finner, MD   3 mL at 09/03/15 2227  . tamsulosin (FLOMAX) capsule 0.4 mg  0.4 mg Oral Daily Gale Journey, MD   0.4 mg at 09/04/15 1191     Discharge Medications: Please see discharge summary for a list of discharge medications.  Relevant Imaging Results:  Relevant Lab Results:   Additional Information  (SSN: [id number])  Verta Ellen Sunkins, LCSW

## 2015-09-04 NOTE — Progress Notes (Signed)
Pastoral Care and prayer provided for patient and family. °

## 2015-09-04 NOTE — Progress Notes (Signed)
[facility]  ROUNDING NOTE   Subjective:  Feels much better today Sitting up in the bed eating breakfast Serum creatinine down to 6.14 Potassium level is normal    Objective:  Vital signs in last 24 hours:  Temp:  [97.9 F (36.6 C)-98.9 F (37.2 C)] 98 F (36.7 C) (01/13 1144) Pulse Rate:  [72-92] 88 (01/13 1144) Resp:  [18-24] 20 (01/13 1144) BP: (136-172)/(68-75) 156/68 mmHg (01/13 1144) SpO2:  [92 %-93 %] 93 % (01/13 1144)  Weight change:  Filed Weights   09/02/15 1500 09/02/15 1742 09/02/15 1937  Weight: 83.3 kg (183 lb 10.3 oz) 83.3 kg (183 lb 10.3 oz) 81.602 kg (179 lb 14.4 oz)    Intake/Output: I/O last 3 completed shifts: In: 1200 [P.O.:1200] Out: 503 [Urine:502; Stool:1]   Intake/Output this shift:  Total I/O In: 240 [P.O.:240] Out: 150 [Urine:150]  Physical Exam: General: NAD, sitting up in bed  Head: Normocephalic, atraumatic. Moist oral mucosal membranes  Eyes: Anicteric,   Neck: Supple, trachea midline  Lungs:  Clear to auscultation  Heart: Regular rate and rhythm  Abdomen:  Soft, nontender,   Extremities: no peripheral edema.  Neurologic: Nonfocal, moving all four extremities  Skin: No lesions    right IJ PermCath     Basic Metabolic Panel:  Recent Labs Lab 08/30/15 0427 08/31/15 0425 09/02/15 0551 09/04/15 0535  NA 139 140 140 137  K 4.7 4.6 4.7 3.7  CL 109 106 102 100*  CO2 17* '25 28 29  '$ GLUCOSE 166* 212* 212* 150*  BUN 94* 99* 87* 56*  CREATININE 6.84* 7.46* 6.87* 6.14*  CALCIUM 8.1* 8.0* 7.7* 7.4*  PHOS  --  4.7*  --   --     Liver Function Tests:  Recent Labs Lab 08/31/15 0425  ALBUMIN 2.3*   No results for input(s): LIPASE, AMYLASE in the last 168 hours. No results for input(s): AMMONIA in the last 168 hours.  CBC:  Recent Labs Lab 08/30/15 0427 08/31/15 0425 09/04/15 0535  WBC 10.5 10.4 6.9  NEUTROABS  --   --  5.1  HGB 10.0* 10.3* 9.3*  HCT 30.2* 30.6* 27.7*  MCV 94.7 94.3 92.9  PLT 400  491* 417    Cardiac Enzymes: No results for input(s): CKTOTAL, CKMB, CKMBINDEX, TROPONINI in the last 168 hours.  BNP: Invalid input(s): POCBNP  CBG:  Recent Labs Lab 09/03/15 1208 09/03/15 1659 09/03/15 2038 09/04/15 0722 09/04/15 1127  GLUCAP 192* 217* 146* 138* 231*    Microbiology: Results for orders placed or performed during the hospital encounter of 08/24/15  CULTURE, BLOOD (ROUTINE X 2) w Reflex to PCR ID Panel     Status: None   Collection Time: 08/26/15  1:47 PM  Result Value Ref Range Status   Specimen Description BLOOD RIGHT HAND  Final   Special Requests   Final    BOTTLES DRAWN AEROBIC AND ANAEROBIC 2CCAEROB,2CCANA   Culture NO GROWTH 5 DAYS  Final   Report Status 08/31/2015 FINAL  Final  CULTURE, BLOOD (ROUTINE X 2) w Reflex to PCR ID Panel     Status: None   Collection Time: 08/26/15  1:56 PM  Result Value Ref Range Status   Specimen Description BLOOD LEFT HAND  Final   Special Requests   Final    BOTTLES DRAWN AEROBIC AND ANAEROBIC 2CCAEROB,1CCANA   Culture NO GROWTH 5 DAYS  Final   Report Status 08/31/2015 FINAL  Final    Coagulation Studies: No results for input(s): LABPROT, INR  in the last 72 hours.  Urinalysis: No results for input(s): COLORURINE, LABSPEC, PHURINE, GLUCOSEU, HGBUR, BILIRUBINUR, KETONESUR, PROTEINUR, UROBILINOGEN, NITRITE, LEUKOCYTESUR in the last 72 hours.  Invalid input(s): APPERANCEUR    Imaging: No results found.   Medications:     . aspirin EC  81 mg Oral Daily  . atorvastatin  40 mg Oral q1800  . heparin subcutaneous  5,000 Units Subcutaneous 3 times per day  . Influenza vac split quadrivalent PF  0.5 mL Intramuscular Tomorrow-1000  . insulin aspart  0-15 Units Subcutaneous TID WC  . insulin glargine  15 Units Subcutaneous QHS  . meclizine  12.5 mg Oral TID  . metoprolol succinate  75 mg Oral Daily  . pantoprazole  40 mg Oral Daily  . senna-docusate  2 tablet Oral Daily  . sodium chloride  3 mL Intravenous  Q12H  . tamsulosin  0.4 mg Oral Daily   sodium chloride, acetaminophen **OR** acetaminophen, hydrALAZINE, ipratropium-albuterol, oxyCODONE-acetaminophen, polyethylene glycol, sodium chloride  Assessment/ Plan:  [name] is a 82 y.o. white male diabetes mellitus type II, hypertension, hyperlipidemia, coronary artery disease, Allergic rhinitis, osteoarthritis, polymyalgia rheumatica, obstructive sleep apnea, who was admitted to [facility] on 08/24/2015 for NSTEMI (non-ST elevated myocardial infarction) ([place]) [I21.4]  1. Acute renal failure on CKD st 4 with metabolic acidosis, hematuria and proteinuria: baseline creatinine of 2.14, eGFR of 26 on 08/25/15.  Acute renal failure seems to be due to ATN. Lisinopril, hypotension and vancomycin   Chronic kidney disease secondary to diabetic nephropathy Renal function is worsening.  - DDX includes ATN, secondary to infection vs vanc toxicity - vasculitis- discussed with family. Biopsy too risky. Will order serologies. ANA is negative - small M spike noted On SPEP. - dialysis did help improvement in his mental status. 2 treatments so far - No acute indication for HD at present - set up outpatient ARF dialysis  For once a week prn - f/u in office next week   2. Proteinuria - Urine protein/creatinine ratio is 1.8 g   3.  Diabetes Mellitus type II with chronic kidney disease: insulin dependent - holding metformin. Do not recommend restarting this medication - hemoglobin A1c of 7.6% on admission,         LOS: 11 Kenneth [name] 1/13/201712:54 PM

## 2015-09-04 NOTE — Progress Notes (Signed)
   09/04/15 1100  Clinical Encounter Type  Visited With Patient and family together  Visit Type Follow-up;Spiritual support  Spiritual Encounters  Spiritual Needs Emotional;Prayer  Stress Factors  Patient Stress Factors Health changes  Visited w/patient & family. Patient preparing to discharge. Provided blessing and pastoral care. Chap. Tylesha Gibeault G. Petrina Melby, ext. [phone]

## 2015-09-04 NOTE — Discharge Instructions (Signed)
Activity as tolerated per PT recommendations Diet-renal and diabetic Follow-up with primary care physician at facility in 3 days Follow-up with cardiology Dr. Mariah MillingGollan in a week Follow-up with nephrology Dr. Thedore MinsSingh in 5 days, to monitor renal function closely [facility]

## 2015-09-04 NOTE — Discharge Summary (Addendum)
[facility] [place] [facility]   PATIENT NAME: Kenneth [name]    MR#:  [id number]  DATE OF BIRTH:  Jul 13, 1928  DATE OF ADMISSION:  08/24/2015 ADMITTING PHYSICIAN: Enedina Finner, MD  DATE OF DISCHARGE: 09/04/2015 PRIMARY CARE PHYSICIAN: Barbette Reichmann, MD    ADMISSION DIAGNOSIS:  NSTEMI (non-ST elevated myocardial infarction) (HCC) [I21.4]  DISCHARGE DIAGNOSIS:    acute on chronic kidney disease Nonsustained V. Tach  SECONDARY DIAGNOSIS:   Past Medical History  Diagnosis Date  . Diabetes mellitus   . Hyperlipidemia   . Hypertension   . Coronary artery disease     Lexiscan myoview (5/10) showed EF 45%, hypokinetic apex and lateral wall, fixed perfusion defect involving apex, inferoapex, and inferolateral wall, also some inferoapical ischemia; LHC (5/10) showed patent LIMA-LAD and totally occluded SVG-OM1, 99% pLAD, 40% ostial CFX, 40% mCFX, 30% OM1, 20% pRCA  . History of MRI of chest 11/10    Cardiac; EF 48%, mid anterolateral and inferolateral walls, apical lateral wall, apical septal wall, and true apex were all think and akinetic; 51-75% thickness subendocardial delayed enhancement in mid anterolateral and inferolateral walls, full thickness enhancement in apical laterla and apical septal walls and true apex  . Nephrolithiasis   . Spinal stenosis   . B12 deficiency   . Obesity   . OSA (obstructive sleep apnea)     Moderate    HOSPITAL COURSE:   Kenneth [name] is a 82 y.o. male with a known history of diabetes, hypertension, coronary disease status post CABG in the past comes to the emergency room with complaints of chest pain nonradiating with some nausea. Had gradual worsening in renal function.  1. Acute on CKD-IV: 2/2 vanc/ infection  Baseline creatinine around 2., --6.87--6.14 Given IV fluids.  No nephrotoxins administered except vancomycin for 2 days during the hospital course for possible infection  Renal ultrasound showing no  calculi or hydronephrosis.  Nephrology recommendations appreciated. Outpatient hemodialysis once a week at [facility] dialysis center is recommended. F/u blood and urine work ups for vasculitis,ordered by nephro Vascular sx placed HD catheter . Started HD 09/01/15. D/w nephrology, currently not considering HD , plan is to monitor renal fx closely as an outpatient and continue hemodialysis once a week at [facility] dialysis center and okay to discharge patient from nephrology standpoint Avoid nephrotoxins-  2. Altered mental status 2/2 Metabolic encephalopathy prob 2/2 a bronchitis-resolved -Fever on 1/4, only once , has not recurred. Blood cultures NTD. Empirically on Vanco and Zosyn, changed to azithromycin.( He received 2 dose of vanc on admission) . D/ced abx after 7 day course VQ scan negative for PE .   3. Dizziness-subacute. improved  Seen by ENT as an outpatient. Started on meclizine with minimal help. -Discontinued Valium due to mental status changes from it. -MRI of the brain and MRA of the head and neck with no acute findings -Physical therapy consult appreciated. Appreciate neurology consult. -Vestibular exercises and therapy recommended.  Need SNF for Rehab at d/c  4. Elevated troponin with history of coronary artery disease status post CABG in the remote past. Likely demand ischemia. Appreciate Cardiology consultation, no further interventions Continue cardiac medications. Beta blocker dose is increased in view of nonsustained V. tach, patient is symptomatic  5. Hypertension- d/c  lisinopril for now given renal failure  6. Type 2 diabetes- sugars are elevated -continue glipizide. -d/c metformin given his CKD. On SSI -continue Lantus   7. DVT Prophylaxis- on subcutaneous heparin  DISCHARGE CONDITIONS:  fair  CONSULTS OBTAINED:  Treatment Team:  Thana FarrLeslie Reynolds, MD Renford DillsGregory G Schnier, MD Ramonita LabAruna Keisean Skowron, MD Antonieta Ibaimothy J Gollan, MD   PROCEDURES -PD catheter  placement  DRUG ALLERGIES:   Allergies  Allergen Reactions  . Beef-Derived Products Hives  . Iodine Hives  . Ivp Dye [Iodinated Diagnostic Agents] Hives    DISCHARGE MEDICATIONS:   Current Discharge Medication List    START taking these medications   Details  atorvastatin (LIPITOR) 40 MG tablet Take 1 tablet (40 mg total) by mouth daily at 6 PM. Qty: 30 tablet, Refills: 0    Influenza vac split quadrivalent PF (FLUARIX) 0.5 ML injection Inject 0.5 mLs into the muscle tomorrow at 10 am. Qty: 0.5 mL, Refills: 0    insulin glargine (LANTUS) 100 UNIT/ML injection Inject 0.15 mLs (15 Units total) into the skin at bedtime. Qty: 10 mL, Refills: 11    ipratropium-albuterol (DUONEB) 0.5-2.5 (3) MG/3ML SOLN Take 3 mLs by nebulization every 4 (four) hours as needed. Qty: 360 mL, Refills: 0    meclizine (ANTIVERT) 12.5 MG tablet Take 1 tablet (12.5 mg total) by mouth 3 (three) times daily. Qty: 30 tablet, Refills: 0    oxyCODONE-acetaminophen (PERCOCET/ROXICET) 5-325 MG tablet Take 1 tablet by mouth every 6 (six) hours as needed for moderate pain or severe pain (chest pain.). Qty: 30 tablet, Refills: 0    pantoprazole (PROTONIX) 40 MG tablet Take 1 tablet (40 mg total) by mouth daily. Qty: 30 tablet, Refills: 0    senna-docusate (SENOKOT-S) 8.6-50 MG tablet Take 2 tablets by mouth daily.    tamsulosin (FLOMAX) 0.4 MG CAPS capsule Take 1 capsule (0.4 mg total) by mouth daily. Qty: 30 capsule, Refills: 0      CONTINUE these medications which have CHANGED   Details  metoprolol succinate (TOPROL-XL) 25 MG 24 hr tablet Take 3 tablets (75 mg total) by mouth daily. Take with or immediately following a meal. Qty: 60 tablet, Refills: 0      CONTINUE these medications which have NOT CHANGED   Details  aspirin 81 MG EC tablet Take 81 mg by mouth daily.        STOP taking these medications     glipiZIDE (GLUCOTROL) 5 MG tablet      lisinopril (PRINIVIL,ZESTRIL) 20 MG tablet       lovastatin (MEVACOR) 20 MG tablet      metFORMIN (GLUCOPHAGE) 1000 MG tablet          DISCHARGE INSTRUCTIONS:  Repeat renal panel on Monday - 09/07/15- fax results to dr.Singh [phone] Activity as tolerated per PT recommendations Diet-renal and diabetic Follow-up with primary care physician at facility in 3 days Follow-up with cardiology Dr. Mariah MillingGollan in a week Follow-up with nephrology Dr. Thedore MinsSingh in 5 days, to monitor renal function closely [facility]   DIET:  Cardiac diet,DIABETIC  DISCHARGE CONDITION:  Fair  ACTIVITY:  Activity as tolerated per PT recommendations  OXYGEN:  Home Oxygen: No.   Oxygen Delivery: room air  DISCHARGE LOCATION:  nursing home   If you experience worsening of your admission symptoms, develop shortness of breath, life threatening emergency, suicidal or homicidal thoughts you must seek medical attention immediately by calling 911 or calling your MD immediately  if symptoms less severe.  You Must read complete instructions/literature along with all the possible adverse reactions/side effects for all the Medicines you take and that have been prescribed to you. Take any new Medicines after you have completely  understood and accpet all the possible adverse reactions/side effects.   Please note  You were cared for by a hospitalist during your hospital stay. If you have any questions about your discharge medications or the care you received while you were in the hospital after you are discharged, you can call the unit and asked to speak with the hospitalist on call if the hospitalist that took care of you is not available. Once you are discharged, your primary care physician will handle any further medical issues. Please note that NO REFILLS for any discharge medications will be authorized once you are discharged, as it is imperative that you return to your primary care physician (or establish a  relationship with a primary care physician if you do not have one) for your aftercare needs so that they can reassess your need for medications and monitor your lab values.     Today  Chief Complaint  Patient presents with  . Chest Pain   Patient is doing much better. Mentating fine. At his baseline. Denies any complaints. Okay to discharge patient from cardiology and nephrology standpoint.   ROS:  CONSTITUTIONAL: Denies fevers, chills. Denies any fatigue, weakness.  EYES: Denies blurry vision, double vision, eye pain. EARS, NOSE, THROAT: Denies tinnitus, ear pain, hearing loss. RESPIRATORY: Denies cough, wheeze, shortness of breath.  CARDIOVASCULAR: Denies chest pain, palpitations, edema.  GASTROINTESTINAL: Denies nausea, vomiting, diarrhea, abdominal pain. Denies bright red blood per rectum. GENITOURINARY: Denies dysuria, hematuria. ENDOCRINE: Denies nocturia or thyroid problems. HEMATOLOGIC AND LYMPHATIC: Denies easy bruising or bleeding. SKIN: Denies rash or lesion. MUSCULOSKELETAL: Denies pain in neck, back, shoulder, knees, hips or arthritic symptoms.  NEUROLOGIC: Denies paralysis, paresthesias.  PSYCHIATRIC: Denies anxiety or depressive symptoms.   VITAL SIGNS:  Blood pressure 172/73, pulse 84, temperature 97.9 F (36.6 C), temperature source Oral, resp. rate 18, height 5\' 6"  (1.676 m), weight 81.602 kg (179 lb 14.4 oz), SpO2 93 %.  I/O:   Intake/Output Summary (Last 24 hours) at 09/04/15 1013 Last data filed at 09/04/15 0357  Gross per 24 hour  Intake   1200 ml  Output    103 ml  Net   1097 ml    PHYSICAL EXAMINATION:  GENERAL:  82 y.o.-year-old patient lying in the bed with no acute distress.  EYES: Pupils equal, round, reactive to light and accommodation. No scleral icterus. Extraocular muscles intact.  HEENT: Head atraumatic, normocephalic. Oropharynx and nasopharynx clear.  NECK:  Supple, no jugular venous distention. No thyroid enlargement, no tenderness.   LUNGS: Normal breath sounds bilaterally, no wheezing, rales,rhonchi or crepitation. No use of accessory muscles of respiration.  CARDIOVASCULAR: S1, S2 normal. No murmurs, rubs, or gallops.  ABDOMEN: Soft, non-tender, non-distended. Bowel sounds present. No organomegaly or mass.  EXTREMITIES: No pedal edema, cyanosis, or clubbing.  NEUROLOGIC: Cranial nerves II through XII are intact. Muscle strength 5/5 in all extremities. Sensation intact. Gait not checked.  PSYCHIATRIC: The patient is alert and oriented x 3.  SKIN: No obvious rash, lesion, or ulcer.  Permanent dialysis catheter is intact  DATA REVIEW:   CBC  Recent Labs Lab 09/04/15 0535  WBC 6.9  HGB 9.3*  HCT 27.7*  PLT 417    Chemistries   Recent Labs Lab 09/04/15 0535  NA 137  K 3.7  CL 100*  CO2 29  GLUCOSE 150*  BUN 56*  CREATININE 6.14*  CALCIUM 7.4*    Cardiac Enzymes No results for input(s): TROPONINI in the last 168 hours.  Microbiology Results  Results for orders placed or performed during the hospital encounter of 08/24/15  CULTURE, BLOOD (ROUTINE X 2) w Reflex to PCR ID Panel     Status: None   Collection Time: 08/26/15  1:47 PM  Result Value Ref Range Status   Specimen Description BLOOD RIGHT HAND  Final   Special Requests   Final    BOTTLES DRAWN AEROBIC AND ANAEROBIC 2CCAEROB,2CCANA   Culture NO GROWTH 5 DAYS  Final   Report Status 08/31/2015 FINAL  Final  CULTURE, BLOOD (ROUTINE X 2) w Reflex to PCR ID Panel     Status: None   Collection Time: 08/26/15  1:56 PM  Result Value Ref Range Status   Specimen Description BLOOD LEFT HAND  Final   Special Requests   Final    BOTTLES DRAWN AEROBIC AND ANAEROBIC 2CCAEROB,1CCANA   Culture NO GROWTH 5 DAYS  Final   Report Status 08/31/2015 FINAL  Final    RADIOLOGY:  No results found.  EKG:   Orders placed or performed during the hospital encounter of 08/24/15  . ED EKG  . ED EKG  . EKG 12-Lead  . EKG 12-Lead      Management plans  discussed with the patient, family and they are in agreement.  CODE STATUS:     Code Status Orders        Start     Ordered   08/24/15 1622  Full code   Continuous     08/24/15 1621    Code Status History    Date Active Date Inactive Code Status Order ID Comments User Context   This patient has a current code status but no historical code status.    Advance Directive Documentation        Most Recent Value   Type of Advance Directive  Healthcare Power of Attorney, Living will   Pre-existing out of facility DNR order (yellow form or pink MOST form)     "MOST" Form in Place?        TOTAL TIME TAKING CARE OF THIS PATIENT: 45  minutes.    @M[facility] @  on 09/04/2015 at 10:13 AM  Between 7am to 6pm - Pager - [phone]  After 6pm go to [email or web address] - password [email or web address] [facility]  [facility]  Office  [phone]  CC: Primary care physician; Barbette Reichmann, MD

## 2015-09-04 NOTE — Telephone Encounter (Signed)
-----   Message from Elsie SaasHiraa M Malhotra sent at 09/04/2015 11:29 AM EST ----- Regarding: tcm TCM saw Dr Mariah MillingGollan in hospital coming 09/14/15 to see Alycia Rossettiyan

## 2015-09-04 NOTE — Progress Notes (Signed)
Checked to see if pre-authorization would be needed for non-emergent EMS transport. Per [employer] benefits obtained online through [employer], patient has a [employer] Medicare Advantage PPO policy or [employer] Medicare Complete Choice PPO policy.  Medicare  Medicare PPO plans do not require pre-auth for non-emergent ground transports using service codes A0426 or A0428.   °

## 2015-09-08 LAB — GLUCOSE, CAPILLARY
GLUCOSE-CAPILLARY: 190 mg/dL — AB (ref 65–99)
Glucose-Capillary: 182 mg/dL — ABNORMAL HIGH (ref 65–99)
Glucose-Capillary: 196 mg/dL — ABNORMAL HIGH (ref 65–99)

## 2015-09-12 ENCOUNTER — Encounter: Payer: Self-pay | Admitting: Physician Assistant

## 2015-09-12 DIAGNOSIS — I255 Ischemic cardiomyopathy: Secondary | ICD-10-CM | POA: Insufficient documentation

## 2015-09-13 ENCOUNTER — Encounter: Payer: Self-pay | Admitting: Physician Assistant

## 2015-09-13 NOTE — Progress Notes (Signed)
Cardiology Office Note Date:  09/14/2015  Patient ID:  Kenneth, [name] 1928/01/10, MRN [id number] PCP:  Barbette Reichmann, MD  Cardiologist:  Dr. Jearld Pies, MD    Chief Complaint: Hospital follow up, cough x 1 day  History of Present Illness: Kenneth [name] is a 82 y.o. male with history of CAD s/p CABG, ischemic cardiomyopathy, COPD with long smoking history, ESRD on HD once weekly as of 08/2015, OSA, and HTN who presents for hospital follow up after recent admission to [facility] form 1/2-1/13 for AMS 2/2 metabolic encephalopathy and bronchitis as well as acute on CKD stage IV now s/p HD catheter with dialysis being advised once weekly post discharge.   Patient underwent cardiac bypass in 1999 with LIMA-LAD and SVG-OM1. Symptoms at that time were fatigue and SOB. No chest pain. He was scheduled for knee replacement surgery in May of 2010. As part of this work up he underwent Lexiscan stress test by outside cardiology group on 12/23/2008 that showed moderate fixed inferoapical, apical and mild fixed posterolateral myocardial perfusion defect c/w previous infarct and/or scar with small reversible inferoapical perfusion defect c/w myocardial ischemia. EF 45%. Because of this he was scheduled for cardiac cath on 01/06/2009 that showed an occluded SVG-OM1 and patent LIMA-LAD, mild anterior wall HK. Remaining cath details per PMH. This was followed up with a cardiac MRI on 11/18/210 that showed an EF of 48% and an akinetic mid inferolateral and mid anterolateral wall, as well as the apex. There was 50% thickness subendocardial delayed enhancement in those walls which likely corresponds to the LCx distribution. He was then lost to follow up for approximately 5 years. He denied any symptoms during that time and was managed by his PCP.   Admitted to [facility] on 1/2 with AMS, metabolic encephalopathy and bronchitis, as well as acute on CKD stage IV. Has been very weak leading up to his admission, with nausea and  vomiting. No chest pain. During his admission troponin was checked and found to be elevated with a peak of 0.18. Echo on 1/2 showed an EF of 55-60%, RWMA could not be excluded, LV diastolic function was normal, PASP was normal, normal VCP. His elevated troponin was felt to be demand ischemia 2/2 his bronchitis and acute on CKD stage IV with a SCr of 2.25 upon admission though this peak at > 7 requiring placement of HD catheter and need for weekly dialysis as an outpatient. He was discharged with a SCr of 6.14. He was seen by neurology for dizziness with the recommendation to address the patient's blood sugars and renal function. Medication management was advised. He did have 7 beats of NSVT, his beta blocker was tirated up, he was asymptomatic.  Since his discharge he has done well. He does note a cough for the past one day. Cough is mildly productive of clear sputum. He is otherwise doing well. He suspects his nephrologist will start him on 3 times weekly HD when he goes back for his dialysis on 1/25. No chest pain, orthopnea, LEE, PND, or early satiety. He is tolerating all of his medications without issues.    Chest pain Heart failure/meds Breathing  Dialysis?   Past Medical History  Diagnosis Date  . Diabetes mellitus   . Hyperlipidemia   . Hypertension   . Coronary artery disease     a. 2vCABG LIMA-LAD, SVG-OM1 in 1999; b. Lexiscan myoview (5/10) showed EF 45%, HK apex & lateral wall, fixed perfusion defect involving apex, inferoapex, & inferolateral wall,  also some inferoapical ischemia; LHC (5/10) showed patent LIMA-LAD and totally occluded SVG-OM1, 99% pLAD, 40% ostial CFX, 40% mCFX, 30% OM1, 20% pRCA  . History of MRI of chest 11/10    a. Cardiac; EF 48%, mid anterolateral and inferolateral walls, apical lateral wall, apical septal wall, and true apex were all think and akinetic; 51-75% thickness subendocardial delayed enhancement in mid anterolateral and inferolateral walls, full  thickness enhancement in apical laterla and apical septal walls and true apex  . Nephrolithiasis   . Spinal stenosis   . B12 deficiency   . Obesity   . OSA (obstructive sleep apnea)     Moderate  . Ischemic cardiomyopathy     a. likely ICM, EF 55-60%, RWMA could not be excluded, nl PASP, nl CVP  . ESRD on hemodialysis [facility])     a. once weekly as of 08/2015    Past Surgical History  Procedure Laterality Date  . Back surgery      Multiple, with spinal stenosis  . Cataract extraction    . Coronary artery bypass graft  1999    In [place] with LIMA-LAD and SVG-OM1  . Total knee arthroplasty  5/10  . Peripheral vascular catheterization N/A 09/01/2015    Procedure: Dialysis/Perma Catheter Insertion;  Surgeon: Renford Dills, MD;  Location: [facility] INVASIVE CV LAB;  Service: Cardiovascular;  Laterality: N/A;    Current Outpatient Prescriptions  Medication Sig Dispense Refill  . aspirin 81 MG EC tablet Take 81 mg by mouth daily.      Marland Kitchen atorvastatin (LIPITOR) 40 MG tablet Take 1 tablet (40 mg total) by mouth daily at 6 PM. 30 tablet 0  . Influenza vac split quadrivalent PF (FLUARIX) 0.5 ML injection Inject 0.5 mLs into the muscle tomorrow at 10 am. 0.5 mL 0  . insulin glargine (LANTUS) 100 UNIT/ML injection Inject 0.15 mLs (15 Units total) into the skin at bedtime. 10 mL 11  . meclizine (ANTIVERT) 12.5 MG tablet Take 1 tablet (12.5 mg total) by mouth 3 (three) times daily. 30 tablet 0  . metoprolol succinate (TOPROL-XL) 25 MG 24 hr tablet Take 3 tablets (75 mg total) by mouth daily. Take with or immediately following a meal. 60 tablet 0  . oxyCODONE-acetaminophen (PERCOCET/ROXICET) 5-325 MG tablet Take 1 tablet by mouth every 6 (six) hours as needed for moderate pain or severe pain (chest pain.). 30 tablet 0  . pantoprazole (PROTONIX) 40 MG tablet Take 1 tablet (40 mg total) by mouth daily. 30 tablet 0  . senna-docusate (SENOKOT-S) 8.6-50 MG tablet Take 2 tablets by mouth daily.    .  tamsulosin (FLOMAX) 0.4 MG CAPS capsule Take 1 capsule (0.4 mg total) by mouth daily. 30 capsule 0  . albuterol (PROVENTIL HFA;VENTOLIN HFA) 108 (90 Base) MCG/ACT inhaler Inhale 2 puffs into the lungs every 6 (six) hours as needed for wheezing or shortness of breath. 1 Inhaler 6   No current facility-administered medications for this visit.    Allergies:   Beef-derived products; Iodine; and Ivp dye   Social History:  The patient  reports that he has quit smoking. He does not have any smokeless tobacco history on file. He reports that he does not drink alcohol or use illicit drugs.   Family History:  The patient's family history includes Heart failure in his father; Kidney failure in his mother.  ROS:   Review of Systems  Constitutional: Positive for weight loss and malaise/fatigue. Negative for fever, chills and diaphoresis.  HENT: Negative for  congestion.   Eyes: Negative for discharge and redness.  Respiratory: Positive for cough, sputum production, shortness of breath and wheezing. Negative for hemoptysis.        Clear sputum  Cardiovascular: Negative for chest pain, palpitations, orthopnea, claudication, leg swelling and PND.  Gastrointestinal: Negative for nausea, vomiting and abdominal pain.  Musculoskeletal: Negative for falls.  Skin: Negative for rash.  Neurological: Positive for weakness. Negative for sensory change, speech change, focal weakness and loss of consciousness.  Endo/Heme/Allergies: Does not bruise/bleed easily.  Psychiatric/Behavioral: The patient is not nervous/anxious.   All other systems reviewed and are negative.     PHYSICAL EXAM:  VS:  BP 122/60 mmHg  Pulse 97  Ht  (1.676 m)  Wt 177 lb 12 oz (80.627 kg)  BMI 28.70 kg/m2  SpO2 93% BMI: Body mass index is 28.7 kg/(m^2). Well nourished, well developed, in no acute distress HEENT: normocephalic, atraumatic Neck: no JVD, carotid bruits or masses Cardiac:  normal S1, S2; RRR; no murmurs, rubs, or  gallops Lungs:  clear to auscultation bilaterally, no wheezing, rhonchi or rales Abd: soft, nontender, no hepatomegaly, + BS MS: no deformity or atrophy Ext: no edema Skin: warm and dry, no rash Neuro:  moves all extremities spontaneously, no focal abnormalities noted, follows commands Psych: euthymic mood, full affect   EKG:  Was ordered today. Shows NSR, 97 bpm, nonspecific inferolateral st/t changes   Recent Labs: 08/24/2015: ALT 10* 08/26/2015: TSH 1.190 09/04/2015: BUN 56*; Creatinine, Ser 6.14*; Hemoglobin 9.3*; Platelets 417; Potassium 3.7; Sodium 137  08/24/2015: Cholesterol 113; HDL 52; LDL Cholesterol 45; Total CHOL/HDL Ratio 2.2; Triglycerides 81; VLDL 16   Estimated Creatinine Clearance: 8.8 mL/min (by C-G formula based on Cr of 6.14).   Wt Readings from Last 3 Encounters:  09/14/15 177 lb 12 oz (80.627 kg)  09/02/15 179 lb 14.4 oz (81.602 kg)  08/28/09 198 lb 8 oz (90.039 kg)     Other studies reviewed: Additional studies/records reviewed today include: He had a resting pulse ox of 93% on room air and with ambulation his pulse ox was 91%  ASSESSMENT AND PLAN:  1. CAD s/p CABG as above:  -No symptoms concerning for angina -No further ischemic evaluation at this time -Aspirin 81 mg, Lipitor 40 mg, Toprol XL 75 mg daily  2. Ischemic cardiomyopathy:  -Volume now managed by HD and he will be transitioned from once weekly dialysis to 3 times weekly HD after his next session on 1/25 per the patient -Continue Toprol XL as above -Weight stable today compared to weight at nephrology   3. ESRD on HD:  -As above  4. COPD:  -Cough likely 2/2 COPD/pulmonary -Add albuterol inhaler -If cough persists consider CXR  5. OSA:  -Uses CPAP nightly  6. HTN:  -Controlled -Continue current medications  Disposition: F/u with Dr. Mariah Milling, MD in 3 months  Current medicines are reviewed at length with the patient today.  The patient did not have any concerns regarding  medicines.  Elinor Dodge PA-C 09/14/2015 5:54 PM     [facility] [address] [place], [place] [address] [phone]

## 2015-09-14 ENCOUNTER — Ambulatory Visit (INDEPENDENT_AMBULATORY_CARE_PROVIDER_SITE_OTHER): Payer: Medicare Other | Admitting: Physician Assistant

## 2015-09-14 ENCOUNTER — Encounter: Payer: Self-pay | Admitting: Physician Assistant

## 2015-09-14 VITALS — BP 122/60 | HR 97 | Ht 66.0 in | Wt 177.8 lb

## 2015-09-14 DIAGNOSIS — G4733 Obstructive sleep apnea (adult) (pediatric): Secondary | ICD-10-CM

## 2015-09-14 DIAGNOSIS — I2510 Atherosclerotic heart disease of native coronary artery without angina pectoris: Secondary | ICD-10-CM

## 2015-09-14 DIAGNOSIS — I255 Ischemic cardiomyopathy: Secondary | ICD-10-CM | POA: Diagnosis not present

## 2015-09-14 DIAGNOSIS — Z992 Dependence on renal dialysis: Secondary | ICD-10-CM

## 2015-09-14 DIAGNOSIS — N186 End stage renal disease: Secondary | ICD-10-CM | POA: Diagnosis not present

## 2015-09-14 DIAGNOSIS — I10 Essential (primary) hypertension: Secondary | ICD-10-CM

## 2015-09-14 DIAGNOSIS — I25810 Atherosclerosis of coronary artery bypass graft(s) without angina pectoris: Secondary | ICD-10-CM | POA: Diagnosis not present

## 2015-09-14 DIAGNOSIS — J449 Chronic obstructive pulmonary disease, unspecified: Secondary | ICD-10-CM

## 2015-09-14 MED ORDER — ALBUTEROL SULFATE HFA 108 (90 BASE) MCG/ACT IN AERS: 2.0000 | INHALATION_SPRAY | Freq: Four times a day (QID) | RESPIRATORY_TRACT | Status: DC | PRN

## 2015-09-14 NOTE — Patient Instructions (Addendum)
Medication Instructions:  Please continue current medications  Please add ProAir 2 puffs every 6 hrs as needed for cough  Labwork: None  Testing/Procedures: None  Follow-Up: Your physician recommends that you schedule a follow-up appointment in: 3 months  Date & time: ___________________________  If you need a refill on your cardiac medications before your next appointment, please call your pharmacy.

## 2015-09-28 DIAGNOSIS — Z992 Dependence on renal dialysis: Secondary | ICD-10-CM

## 2015-09-28 DIAGNOSIS — N186 End stage renal disease: Secondary | ICD-10-CM | POA: Insufficient documentation

## 2015-10-27 ENCOUNTER — Encounter: Payer: Self-pay | Admitting: *Deleted

## 2015-11-09 ENCOUNTER — Other Ambulatory Visit: Payer: Self-pay | Admitting: Vascular Surgery

## 2015-11-10 ENCOUNTER — Encounter
Admission: RE | Admit: 2015-11-10 | Discharge: 2015-11-10 | Disposition: A | Discharge status: Home / Self Care | Payer: Medicare Other | Source: Ambulatory Visit | Attending: Vascular Surgery | Admitting: Vascular Surgery

## 2015-11-10 DIAGNOSIS — Z01812 Encounter for preprocedural laboratory examination: Secondary | ICD-10-CM | POA: Diagnosis not present

## 2015-11-10 HISTORY — DX: Tremor, unspecified: R25.1

## 2015-11-10 HISTORY — DX: Anemia, unspecified: D64.9

## 2015-11-10 HISTORY — DX: Anxiety disorder, unspecified: F41.9

## 2015-11-10 HISTORY — DX: Gastro-esophageal reflux disease without esophagitis: K21.9

## 2015-11-10 HISTORY — DX: Reserved for inherently not codable concepts without codable children: IMO0001

## 2015-11-10 HISTORY — DX: Unspecified osteoarthritis, unspecified site: M19.90

## 2015-11-10 LAB — BASIC METABOLIC PANEL
Anion gap: 12 (ref 5–15)
BUN: 31 mg/dL — AB (ref 6–20)
CHLORIDE: 96 mmol/L — AB (ref 101–111)
CO2: 24 mmol/L (ref 22–32)
Calcium: 8.1 mg/dL — ABNORMAL LOW (ref 8.9–10.3)
Creatinine, Ser: 4.84 mg/dL — ABNORMAL HIGH (ref 0.61–1.24)
GFR calc Af Amer: 11 mL/min — ABNORMAL LOW (ref >60)
GFR calc non Af Amer: 10 mL/min — ABNORMAL LOW (ref >60)
GLUCOSE: 213 mg/dL — AB (ref 65–99)
POTASSIUM: 3.7 mmol/L (ref 3.5–5.1)
SODIUM: 132 mmol/L — AB (ref 135–145)

## 2015-11-10 LAB — CBC WITH DIFFERENTIAL/PLATELET
Basophils Absolute: 0 10*3/uL (ref 0–0.1)
Basophils Relative: 0 %
EOS PCT: 0 %
Eosinophils Absolute: 0 10*3/uL (ref 0–0.7)
HCT: 35.6 % — ABNORMAL LOW (ref 40.0–52.0)
Hemoglobin: 11.6 g/dL — ABNORMAL LOW (ref 13.0–18.0)
LYMPHS ABS: 1 10*3/uL (ref 1.0–3.6)
LYMPHS PCT: 11 %
MCH: 30.9 pg (ref 26.0–34.0)
MCHC: 32.7 g/dL (ref 32.0–36.0)
MCV: 94.5 fL (ref 80.0–100.0)
Monocytes Absolute: 0.5 10*3/uL (ref 0.2–1.0)
Monocytes Relative: 6 %
Neutro Abs: 7.1 10*3/uL — ABNORMAL HIGH (ref 1.4–6.5)
Neutrophils Relative %: 83 %
PLATELETS: 205 10*3/uL (ref 150–440)
RBC: 3.76 MIL/uL — AB (ref 4.40–5.90)
RDW: 15.9 % — ABNORMAL HIGH (ref 11.5–14.5)
WBC: 8.6 10*3/uL (ref 3.8–10.6)

## 2015-11-10 LAB — TYPE AND SCREEN
ABO/RH(D): A NEG
ANTIBODY SCREEN: NEGATIVE

## 2015-11-10 LAB — ABO/RH: ABO/RH(D): A NEG

## 2015-11-10 LAB — APTT: aPTT: 35 seconds (ref 24–36)

## 2015-11-10 LAB — PROTIME-INR
INR: 1.13
Prothrombin Time: 14.7 seconds (ref 11.4–15.0)

## 2015-11-10 LAB — SURGICAL PCR SCREEN
MRSA, PCR: NEGATIVE
STAPHYLOCOCCUS AUREUS: NEGATIVE

## 2015-11-10 NOTE — Patient Instructions (Signed)
  Your procedure is scheduled on: November 20, 2015 (Friday) Report to Day Surgery.[facility](Medical Mall) Second Floor To find out your arrival time please call [phone] between 1PM - 3PM on November 19, 2015 (Thursday).  Remember: Instructions that are not followed completely may result in serious medical risk, up to and including death, or upon the discretion of your surgeon and anesthesiologist your surgery may need to be rescheduled.    __x__ 1. Do not eat food or drink liquids after midnight. No gum chewing or hard candies.     ____ 2. No Alcohol for 24 hours before or after surgery.   ____ 3. Bring all medications with you on the day of surgery if instructed.    __x__ 4. Notify your doctor if there is any change in your medical condition     (cold, fever, infections).     Do not wear jewelry, make-up, hairpins, clips or nail polish.  Do not wear lotions, powders, or perfumes. You may wear deodorant.  Do not shave 48 hours prior to surgery. Men may shave face and neck.  Do not bring valuables to the hospital.    [facility] is not responsible for any belongings or valuables.               Contacts, dentures or bridgework may not be worn into surgery.  Leave your suitcase in the car. After surgery it may be brought to your room.  For patients admitted to the hospital, discharge time is determined by your                treatment team.   Patients discharged the day of surgery will not be allowed to drive home.   Please read over the following fact sheets that you were given:   MRSA Information and Surgical Site Infection Prevention   ____ Take these medicines the morning of surgery with A SIP OF WATER:    1. Metoprolol  2.   3.   4.  5.  6.  ____ Fleet Enema (as directed)   __x__ Use CHG Soap as directed  __x__ Use inhalers on the day of surgery (Use Albuterol inhaler the morning of surgery and bring to hospital)  ____ Stop metformin 2 days prior to surgery    __x__ Take 1/2  of usual insulin dose the night before surgery and none on the morning of surgery.( Take one-half of Lantus the night before surgery and no insulin the morning of surgery)  __x__ Stop Coumadin/Plavix/aspirin on (No Aspirin the morning of surgery)  _x__ Stop Anti-inflammatories on (NO NSAIDS)   ____ Stop supplements until after surgery.    __x__ Bring C-Pap to the hospital.

## 2015-11-13 ENCOUNTER — Other Ambulatory Visit: Payer: Self-pay | Admitting: Nephrology

## 2015-11-13 DIAGNOSIS — R310 Gross hematuria: Secondary | ICD-10-CM

## 2015-11-13 DIAGNOSIS — R1084 Generalized abdominal pain: Secondary | ICD-10-CM

## 2015-11-18 ENCOUNTER — Ambulatory Visit
Admission: RE | Admit: 2015-11-18 | Discharge: 2015-11-18 | Disposition: A | Discharge status: Home / Self Care | Payer: Medicare Other | Source: Ambulatory Visit | Attending: Nephrology | Admitting: Nephrology

## 2015-11-18 DIAGNOSIS — R310 Gross hematuria: Secondary | ICD-10-CM | POA: Diagnosis present

## 2015-11-18 DIAGNOSIS — R1084 Generalized abdominal pain: Secondary | ICD-10-CM | POA: Diagnosis present

## 2015-11-18 DIAGNOSIS — K5730 Diverticulosis of large intestine without perforation or abscess without bleeding: Secondary | ICD-10-CM | POA: Insufficient documentation

## 2015-11-20 ENCOUNTER — Encounter: Admission: RE | Payer: Self-pay | Source: Ambulatory Visit

## 2015-11-20 ENCOUNTER — Ambulatory Visit: Admission: RE | Admit: 2015-11-20 | Payer: Medicare Other | Source: Ambulatory Visit | Admitting: Vascular Surgery

## 2015-11-20 SURGERY — TRANSPOSITION, VEIN, BASILIC
Anesthesia: General | Laterality: Left

## 2015-12-03 ENCOUNTER — Other Ambulatory Visit: Payer: Self-pay | Admitting: Vascular Surgery

## 2015-12-07 ENCOUNTER — Emergency Department
Admission: EM | Admit: 2015-12-07 | Discharge: 2015-12-07 | Disposition: A | Discharge status: Home / Self Care | Payer: Medicare Other | Attending: Emergency Medicine | Admitting: Emergency Medicine

## 2015-12-07 ENCOUNTER — Encounter: Payer: Self-pay | Admitting: Emergency Medicine

## 2015-12-07 ENCOUNTER — Emergency Department: Payer: Medicare Other

## 2015-12-07 ENCOUNTER — Other Ambulatory Visit: Payer: Self-pay

## 2015-12-07 DIAGNOSIS — M1990 Unspecified osteoarthritis, unspecified site: Secondary | ICD-10-CM | POA: Diagnosis not present

## 2015-12-07 DIAGNOSIS — I5032 Chronic diastolic (congestive) heart failure: Secondary | ICD-10-CM | POA: Insufficient documentation

## 2015-12-07 DIAGNOSIS — N189 Chronic kidney disease, unspecified: Secondary | ICD-10-CM

## 2015-12-07 DIAGNOSIS — I509 Heart failure, unspecified: Secondary | ICD-10-CM | POA: Diagnosis not present

## 2015-12-07 DIAGNOSIS — M4800 Spinal stenosis, site unspecified: Secondary | ICD-10-CM | POA: Diagnosis not present

## 2015-12-07 DIAGNOSIS — I255 Ischemic cardiomyopathy: Secondary | ICD-10-CM | POA: Diagnosis not present

## 2015-12-07 DIAGNOSIS — Y999 Unspecified external cause status: Secondary | ICD-10-CM | POA: Insufficient documentation

## 2015-12-07 DIAGNOSIS — Z951 Presence of aortocoronary bypass graft: Secondary | ICD-10-CM | POA: Diagnosis not present

## 2015-12-07 DIAGNOSIS — I2510 Atherosclerotic heart disease of native coronary artery without angina pectoris: Secondary | ICD-10-CM | POA: Diagnosis not present

## 2015-12-07 DIAGNOSIS — I25111 Atherosclerotic heart disease of native coronary artery with angina pectoris with documented spasm: Secondary | ICD-10-CM | POA: Diagnosis not present

## 2015-12-07 DIAGNOSIS — Z87891 Personal history of nicotine dependence: Secondary | ICD-10-CM | POA: Diagnosis not present

## 2015-12-07 DIAGNOSIS — M8580 Other specified disorders of bone density and structure, unspecified site: Secondary | ICD-10-CM | POA: Diagnosis not present

## 2015-12-07 DIAGNOSIS — Z91018 Allergy to other foods: Secondary | ICD-10-CM | POA: Diagnosis not present

## 2015-12-07 DIAGNOSIS — Z96659 Presence of unspecified artificial knee joint: Secondary | ICD-10-CM | POA: Diagnosis not present

## 2015-12-07 DIAGNOSIS — Z794 Long term (current) use of insulin: Secondary | ICD-10-CM | POA: Diagnosis not present

## 2015-12-07 DIAGNOSIS — Z79899 Other long term (current) drug therapy: Secondary | ICD-10-CM | POA: Insufficient documentation

## 2015-12-07 DIAGNOSIS — Z7982 Long term (current) use of aspirin: Secondary | ICD-10-CM | POA: Diagnosis not present

## 2015-12-07 DIAGNOSIS — Z9842 Cataract extraction status, left eye: Secondary | ICD-10-CM | POA: Diagnosis not present

## 2015-12-07 DIAGNOSIS — D649 Anemia, unspecified: Secondary | ICD-10-CM | POA: Diagnosis not present

## 2015-12-07 DIAGNOSIS — G4733 Obstructive sleep apnea (adult) (pediatric): Secondary | ICD-10-CM | POA: Diagnosis not present

## 2015-12-07 DIAGNOSIS — J942 Hemothorax: Secondary | ICD-10-CM | POA: Diagnosis present

## 2015-12-07 DIAGNOSIS — I252 Old myocardial infarction: Secondary | ICD-10-CM | POA: Diagnosis not present

## 2015-12-07 DIAGNOSIS — R251 Tremor, unspecified: Secondary | ICD-10-CM | POA: Diagnosis not present

## 2015-12-07 DIAGNOSIS — N186 End stage renal disease: Secondary | ICD-10-CM | POA: Insufficient documentation

## 2015-12-07 DIAGNOSIS — Y939 Activity, unspecified: Secondary | ICD-10-CM | POA: Diagnosis not present

## 2015-12-07 DIAGNOSIS — R0602 Shortness of breath: Secondary | ICD-10-CM | POA: Diagnosis not present

## 2015-12-07 DIAGNOSIS — E1122 Type 2 diabetes mellitus with diabetic chronic kidney disease: Secondary | ICD-10-CM | POA: Diagnosis not present

## 2015-12-07 DIAGNOSIS — S20212A Contusion of left front wall of thorax, initial encounter: Secondary | ICD-10-CM | POA: Insufficient documentation

## 2015-12-07 DIAGNOSIS — W19XXXA Unspecified fall, initial encounter: Secondary | ICD-10-CM | POA: Insufficient documentation

## 2015-12-07 DIAGNOSIS — I132 Hypertensive heart and chronic kidney disease with heart failure and with stage 5 chronic kidney disease, or end stage renal disease: Secondary | ICD-10-CM | POA: Diagnosis not present

## 2015-12-07 DIAGNOSIS — J449 Chronic obstructive pulmonary disease, unspecified: Secondary | ICD-10-CM | POA: Diagnosis not present

## 2015-12-07 DIAGNOSIS — Y929 Unspecified place or not applicable: Secondary | ICD-10-CM | POA: Diagnosis not present

## 2015-12-07 DIAGNOSIS — E785 Hyperlipidemia, unspecified: Secondary | ICD-10-CM | POA: Diagnosis not present

## 2015-12-07 DIAGNOSIS — M5126 Other intervertebral disc displacement, lumbar region: Secondary | ICD-10-CM | POA: Diagnosis not present

## 2015-12-07 DIAGNOSIS — K8689 Other specified diseases of pancreas: Secondary | ICD-10-CM | POA: Insufficient documentation

## 2015-12-07 DIAGNOSIS — J9811 Atelectasis: Secondary | ICD-10-CM | POA: Diagnosis not present

## 2015-12-07 DIAGNOSIS — E119 Type 2 diabetes mellitus without complications: Secondary | ICD-10-CM | POA: Diagnosis not present

## 2015-12-07 DIAGNOSIS — K219 Gastro-esophageal reflux disease without esophagitis: Secondary | ICD-10-CM | POA: Diagnosis not present

## 2015-12-07 DIAGNOSIS — K449 Diaphragmatic hernia without obstruction or gangrene: Secondary | ICD-10-CM | POA: Diagnosis not present

## 2015-12-07 DIAGNOSIS — Z87442 Personal history of urinary calculi: Secondary | ICD-10-CM | POA: Diagnosis not present

## 2015-12-07 DIAGNOSIS — Z9841 Cataract extraction status, right eye: Secondary | ICD-10-CM | POA: Diagnosis not present

## 2015-12-07 DIAGNOSIS — R0789 Other chest pain: Secondary | ICD-10-CM

## 2015-12-07 DIAGNOSIS — Z91041 Radiographic dye allergy status: Secondary | ICD-10-CM | POA: Diagnosis not present

## 2015-12-07 DIAGNOSIS — K5730 Diverticulosis of large intestine without perforation or abscess without bleeding: Secondary | ICD-10-CM | POA: Diagnosis not present

## 2015-12-07 DIAGNOSIS — R0600 Dyspnea, unspecified: Secondary | ICD-10-CM | POA: Diagnosis not present

## 2015-12-07 DIAGNOSIS — Z96652 Presence of left artificial knee joint: Secondary | ICD-10-CM | POA: Diagnosis not present

## 2015-12-07 DIAGNOSIS — F419 Anxiety disorder, unspecified: Secondary | ICD-10-CM | POA: Diagnosis not present

## 2015-12-07 DIAGNOSIS — E669 Obesity, unspecified: Secondary | ICD-10-CM | POA: Insufficient documentation

## 2015-12-07 DIAGNOSIS — E538 Deficiency of other specified B group vitamins: Secondary | ICD-10-CM | POA: Diagnosis not present

## 2015-12-07 DIAGNOSIS — E1142 Type 2 diabetes mellitus with diabetic polyneuropathy: Secondary | ICD-10-CM | POA: Diagnosis not present

## 2015-12-07 DIAGNOSIS — J90 Pleural effusion, not elsewhere classified: Secondary | ICD-10-CM | POA: Diagnosis not present

## 2015-12-07 DIAGNOSIS — Z992 Dependence on renal dialysis: Secondary | ICD-10-CM | POA: Diagnosis not present

## 2015-12-07 LAB — BASIC METABOLIC PANEL
ANION GAP: 10 (ref 5–15)
BUN: 29 mg/dL — ABNORMAL HIGH (ref 6–20)
CO2: 25 mmol/L (ref 22–32)
Calcium: 7.9 mg/dL — ABNORMAL LOW (ref 8.9–10.3)
Chloride: 99 mmol/L — ABNORMAL LOW (ref 101–111)
Creatinine, Ser: 4.02 mg/dL — ABNORMAL HIGH (ref 0.61–1.24)
GFR calc Af Amer: 14 mL/min — ABNORMAL LOW (ref >60)
GFR, EST NON AFRICAN AMERICAN: 12 mL/min — AB (ref >60)
Glucose, Bld: 199 mg/dL — ABNORMAL HIGH (ref 65–99)
POTASSIUM: 4.6 mmol/L (ref 3.5–5.1)
SODIUM: 134 mmol/L — AB (ref 135–145)

## 2015-12-07 LAB — TROPONIN I
TROPONIN I: 0.05 ng/mL — AB (ref <0.031)
Troponin I: 0.05 ng/mL — ABNORMAL HIGH (ref <0.031)

## 2015-12-07 LAB — CBC WITH DIFFERENTIAL/PLATELET
Basophils Absolute: 0.1 10*3/uL (ref 0–0.1)
Basophils Relative: 2 %
Eosinophils Absolute: 0.3 10*3/uL (ref 0–0.7)
Eosinophils Relative: 6 %
HCT: 32 % — ABNORMAL LOW (ref 40.0–52.0)
Hemoglobin: 10.4 g/dL — ABNORMAL LOW (ref 13.0–18.0)
LYMPHS ABS: 1.4 10*3/uL (ref 1.0–3.6)
LYMPHS PCT: 29 %
MCH: 30.3 pg (ref 26.0–34.0)
MCHC: 32.5 g/dL (ref 32.0–36.0)
MCV: 93.1 fL (ref 80.0–100.0)
MONO ABS: 0.5 10*3/uL (ref 0.2–1.0)
MONOS PCT: 10 %
NEUTROS ABS: 2.6 10*3/uL (ref 1.4–6.5)
Neutrophils Relative %: 53 %
PLATELETS: 246 10*3/uL (ref 150–440)
RBC: 3.44 MIL/uL — ABNORMAL LOW (ref 4.40–5.90)
RDW: 17.9 % — AB (ref 11.5–14.5)
WBC: 4.9 10*3/uL (ref 3.8–10.6)

## 2015-12-07 NOTE — ED Provider Notes (Signed)
[facility] Emergency Department Provider Note   ____________________________________________  Time seen: I have reviewed the triage vital signs and the triage nursing note.  HISTORY  Chief Complaint Hemothorax   Historian Patient  HPI Kenneth [name] is a 82 y.o. malewho was sent here from urgent care for evaluation for left-sided chest pain. He sustained a fall onto his left lower rib cage where he has a bruise and is tender on Saturday. He's having a little bit of shortness of breath, and has oxygen that he can use at night if he wants to. He had a chest x-ray done at urgent care which showed "hemothorax."  Chest pain is moderate especially with moving the chest wall palpation.    Past Medical History  Diagnosis Date  . Diabetes mellitus   . Hyperlipidemia   . Hypertension   . Coronary artery disease     a. 2vCABG LIMA-LAD, SVG-OM1 in 1999; b. Lexiscan myoview (5/10) showed EF 45%, HK apex & lateral wall, fixed perfusion defect involving apex, inferoapex, & inferolateral wall, also some inferoapical ischemia; LHC (5/10) showed patent LIMA-LAD and totally occluded SVG-OM1, 99% pLAD, 40% ostial CFX, 40% mCFX, 30% OM1, 20% pRCA  . History of MRI of chest 11/10    a. Cardiac; EF 48%, mid anterolateral and inferolateral walls, apical lateral wall, apical septal wall, and true apex were all think and akinetic; 51-75% thickness subendocardial delayed enhancement in mid anterolateral and inferolateral walls, full thickness enhancement in apical laterla and apical septal walls and true apex  . Nephrolithiasis   . Spinal stenosis   . B12 deficiency   . Obesity   . Ischemic cardiomyopathy     a. likely ICM, EF 55-60%, RWMA could not be excluded, nl PASP, nl CVP  . ESRD on hemodialysis (HCC)     a. once weekly as of 08/2015  . OSA (obstructive sleep apnea)     Moderate CPAP  . Shortness of breath dyspnea   . Anxiety   . GERD (gastroesophageal reflux disease)    . Arthritis   . Anemia   . Occasional tremors     Patient Active Problem List   Diagnosis Date Noted  . Ischemic cardiomyopathy   . CKD (chronic kidney disease) stage 4, GFR 15-29 ml/min (HCC) 08/26/2015  . Sinus tachycardia (HCC) 08/26/2015  . Demand ischemia (HCC)   . Coronary artery disease involving coronary bypass graft of native heart without angina pectoris   . Ischemic chest pain (HCC)   . Acute non-ST elevation myocardial infarction (NSTEMI) (HCC) 08/24/2015  . NSTEMI (non-ST elevated myocardial infarction) (HCC)   . Angina pectoris (HCC)   . SOB (shortness of breath)   . Chronic diastolic CHF (congestive heart failure) (HCC)   . Centrilobular emphysema (HCC)   . Coronary artery disease involving coronary bypass graft of native heart with angina pectoris with documented spasm (HCC)   . Hyperlipidemia   . OSA on CPAP 08/19/2009  . DYSPNEA 07/17/2009  . SNORING 07/17/2009  . HYPERLIPIDEMIA-MIXED 07/02/2009  . HYPERTENSION, UNSPECIFIED 07/02/2009  . CORONARY ATHEROSCLEROSIS OF ARTERY BYPASS GRAFT 07/02/2009  . CARDIOMYOPATHY, ISCHEMIC 07/02/2009    Past Surgical History  Procedure Laterality Date  . Back surgery      Multiple, with spinal stenosis  . Cataract extraction    . Coronary artery bypass graft  1999    In [place] with LIMA-LAD and SVG-OM1  . Total knee arthroplasty  5/10  . Peripheral vascular catheterization N/A 09/01/2015  Procedure: Dialysis/Perma Catheter Insertion;  Surgeon: Renford Dills, MD;  Location: [facility] INVASIVE CV LAB;  Service: Cardiovascular;  Laterality: N/A;  . Eye surgery Bilateral     Cataract extraction with IOL  . Cardiac catheterization    . Joint replacement Left     Total Knee Replacement  . Insertion of dialysis catheter Right     Dr. Wyn Quaker, [facility]    Current Outpatient Rx  Name  Route  Sig  Dispense  Refill  . albuterol (PROVENTIL HFA;VENTOLIN HFA) 108 (90 Base) MCG/ACT inhaler   Inhalation   Inhale 2 puffs into the  lungs every 6 (six) hours as needed for wheezing or shortness of breath.   1 Inhaler   6   . aspirin EC 81 MG tablet   Oral   Take 81 mg by mouth daily.         . cinacalcet (SENSIPAR) 30 MG tablet   Oral   Take 30 mg by mouth daily.         . Insulin Detemir (LEVEMIR FLEXTOUCH) 100 UNIT/ML Pen   Subcutaneous   Inject 22 Units into the skin daily before breakfast.         . losartan (COZAAR) 50 MG tablet   Oral   Take 50 mg by mouth daily.         Marland Kitchen lovastatin (MEVACOR) 20 MG tablet   Oral   Take 20 mg by mouth at bedtime.         . metoprolol succinate (TOPROL-XL) 50 MG 24 hr tablet   Oral   Take 50 mg by mouth daily. Take with or immediately following a meal.         . tamsulosin (FLOMAX) 0.4 MG CAPS capsule   Oral   Take 1 capsule (0.4 mg total) by mouth daily.   30 capsule   0   . meclizine (ANTIVERT) 12.5 MG tablet   Oral   Take 1 tablet (12.5 mg total) by mouth 3 (three) times daily. Patient not taking: Reported on 12/07/2015   30 tablet   0   . oxyCODONE-acetaminophen (PERCOCET/ROXICET) 5-325 MG tablet   Oral   Take 1 tablet by mouth every 6 (six) hours as needed for moderate pain or severe pain (chest pain.). Patient not taking: Reported on 12/07/2015   30 tablet   0   . senna-docusate (SENOKOT-S) 8.6-50 MG tablet   Oral   Take 2 tablets by mouth daily. Patient not taking: Reported on 12/07/2015           Allergies Beef-derived products; Iodine; and Ivp dye  Family History  Problem Relation Age of Onset  . Kidney failure Mother   . Heart failure Father     Social History Social History  Substance Use Topics  . Smoking status: Former Smoker    Types: Cigarettes  . Smokeless tobacco: Never Used  . Alcohol Use: No    Review of Systems  Constitutional: Negative for fever. Eyes: Negative for visual changes. ENT: Negative for sore throat. Cardiovascular: Positive for left chest wall pain. Respiratory: Positive for chronic  shortness of breath, not really any worse. Gastrointestinal: Negative for abdominal pain, vomiting and diarrhea. Genitourinary: Negative for dysuria. Musculoskeletal: Negative for back pain. Skin: Negative for rash. Neurological: Negative for headache. 10 point Review of Systems otherwise negative ____________________________________________   PHYSICAL EXAM:  VITAL SIGNS: ED Triage Vitals  Enc Vitals Group     BP 12/07/15 1557 140/63 mmHg  Pulse Rate 12/07/15 1557 92     Resp 12/07/15 1614 18     Temp 12/07/15 1557 98.2 F (36.8 C)     Temp Source 12/07/15 1557 Oral     SpO2 12/07/15 1557 96 %     Weight 12/07/15 1557 170 lb (77.111 kg)     Height 12/07/15 1557  (1.676 m)     Head Cir --      Peak Flow --      Pain Score 12/07/15 1601 4     Pain Loc --      Pain Edu? --      Excl. in GC? --      Constitutional: Alert and oriented. Well appearing and in no distress. HEENT   Head: Normocephalic and atraumatic.      Eyes: Conjunctivae are normal. PERRL. Normal extraocular movements.      Ears:         Nose: No congestion/rhinnorhea.   Mouth/Throat: Mucous membranes are moist.   Neck: No stridor.No neck pain Cardiovascular/Chest: Normal rate, regular rhythm.  No murmurs, rubs, or gallops. Sternotomy scar on the chest. Left lower rib ecchymosis with tenderness to palpation over the area of ecchymosis. Respiratory: Normal respiratory effort without tachypnea nor retractions. Breath sounds are clear and equal bilaterally. No wheezes/rales/rhonchi. Gastrointestinal: Soft. No distention, no guarding, no rebound. Nontender.    Genitourinary/rectal:Deferred Musculoskeletal: Nontender with normal range of motion in all extremities. No joint effusions.  No lower extremity tenderness.  No edema. Neurologic:  Normal speech and language. No gross or focal neurologic deficits are appreciated. Skin:  Skin is warm, dry and intact. No rash noted. Psychiatric: Mood and  affect are normal. Speech and behavior are normal. Patient exhibits appropriate insight and judgment.  ____________________________________________   EKG I, Governor Rooks, MD, the attending physician have personally viewed and interpreted all ECGs.  82 bpm. Normal sinus rhythm. Narrow QRS. Normal axis. Inverted T waves 1, flat in aVL, inverted inferiorly and laterally. Prior EKG is somewhat similar nonspecific findings inferior laterally. ____________________________________________  LABS (pertinent positives/negatives)  Troponin 0.05 Sodium 134, chloride 99, BUN 29 and creatinine 4.02 White blood count 4.9, hemoglobin 10.4, platelet count 246 Repeat troponin pending  ____________________________________________  RADIOLOGY All Xrays were viewed by me. Imaging interpreted by Radiologist.  Ribs left with chest:   IMPRESSION: COPD changes with LEFT pleural effusion and bibasilar atelectasis. IMPRESSION: Left basilar changes similar to that seen on the prior exam. No definitive rib fracture is noted.  CT chest without contrast:  IMPRESSION: 1. No rib fracture or acute traumatic abnormality. 2. Small bilateral pleural effusions, left greater than right with adjacent compressive atelectasis in the dependent lungs. 3. Mild to moderate emphysema and chronic lung disease. 4. Cystic density in the pancreatic head/neck measures approximately 2 cm. This may be an incidental IPMN (intraductal papillary mucinous neoplasm), and of questionable clinical significance in this 82 year old patient. Further characterization could be considered on a nonemergent basis with MRI.  __________________________________________  PROCEDURES  Procedure(s) performed: None  Critical Care performed: None  ____________________________________________   ED COURSE / ASSESSMENT AND PLAN  Pertinent labs & imaging results that were available during my care of the patient were reviewed by me and  considered in my medical decision making (see chart for details).  Patient is here for evaluation of left chest pain, which appears to be due to left chest wall ecchymosis and possible underlying rib fracture. Chest x-ray does not show a certain rib fracture, there  is a small left pleural effusion, which is likely related potentially to a rib fracture.  I will order a chest CT, as the patient states he was sent here by provider to receive a chest CT.  He does have a cardiac history, and although I don't think he is having chest pain due to ACS, I will review an EKG and lab work.  EKG nonspecific in the inferior lateral leads, which is somewhat similar to prior EKGs. Possibly slightly more pronounced, but essentially similar.  History of present was minimally elevated at 0.05, but in reviewing prior labs, he's been minimally elevated at about 0.15 previously. I am sending a repeat troponin to make sure that this is not trending up.  His BUN and creatinine are similar to prior, creatinine is at a baseline of around 4.  Care transferred to Dr. Huel CoteQuigley, who will review repeat troponin, and if not trending up, will discharge with my discharge instructions. I did update the family.   CONSULTATIONS:   None   Patient / Family / Caregiver informed of clinical course, medical decision-making process, and agree with plan.   I discussed return precautions, follow-up instructions, and discharged instructions with patient and/or family.   ___________________________________________   FINAL CLINICAL IMPRESSION(S) / ED DIAGNOSES   Final diagnoses:  Left-sided chest wall pain  Chest wall contusion, left, initial encounter  Pancreatic mass  Chronic renal failure, unspecified stage              Note: This dictation was prepared with Dragon dictation. Any transcriptional errors that result from this process are unintentional   Governor Rooksebecca Mikalyn Hermida, MD 12/07/15 2155

## 2015-12-07 NOTE — Discharge Instructions (Signed)
You were evaluated for chest contusion and chest pain and found to have bruising to the chest wall, likely bruising to the underlying ribs, and on chest CT was found have a small pleural effusion which is fluid at the base of the lung which is nonspecific.  Your CT scan showed a nonspecific possible mass on the pancreas, for which the radiologist is recommending a nonemergency, outpatient MRI of the abdomen to evaluate the pancreas. Discussed this with your primary care doctor this week in order to have this scheduled.  Return to the emergency room for any worsening condition cling trouble breathing, shortness of breath, coughing up blood, dizziness or passing out, or any other symptoms concerning to you.   Chest Contusion A contusion is a deep bruise. Bruises happen when an injury causes bleeding under the skin. Signs of bruising include pain, puffiness (swelling), and discolored skin. The bruise may turn blue, purple, or yellow.  HOME CARE  Put ice on the injured area.  Put ice in a plastic bag.  Place a towel between the skin and the bag.  Leave the ice on for 15-20 minutes at a time, 03-04 times a day for the first 48 hours.  Only take medicine as told by your doctor.  Rest.  Take deep breaths (deep-breathing exercises) as told by your doctor.  Stop smoking if you smoke.  Do not lift objects over 5 pounds (2.3 kilograms) for 3 days or longer if told by your doctor. GET HELP RIGHT AWAY IF:   You have more bruising or puffiness.  You have pain that gets worse.  You have trouble breathing.  You are dizzy, weak, or pass out (faint).  You have blood in your pee (urine) or poop (stool).  You cough up or throw up (vomit) blood.  Your puffiness or pain is not helped with medicines. MAKE SURE YOU:   Understand these instructions.  Will watch your condition.  Will get help right away if you are not doing well or get worse.   This information is not intended to replace  advice given to you by your health care provider. Make sure you discuss any questions you have with your health care provider.   Document Released: 01/25/2008 Document Revised: 05/02/2012 Document Reviewed: 01/30/2012 Elsevier Interactive Patient Education [employer]2016 Elsevier Inc.

## 2015-12-07 NOTE — ED Notes (Signed)
Pt given meal tray and water 

## 2015-12-07 NOTE — ED Notes (Signed)
Pt presents to ED with known hemothorax. Pt states sent here by Urgent Care in [place].

## 2015-12-07 NOTE — ED Provider Notes (Signed)
-----------------------------------------   11:28 PM on 12/07/2015 -----------------------------------------   Blood pressure 147/65, pulse 82, temperature 98.2 F (36.8 C), temperature source Oral, resp. rate 18, height 5\' 6"  (1.676 m), weight 170 lb (77.111 kg), SpO2 95 %.  Assuming care from Dr. Shaune PollackLord.  In short, Kenneth [name] is a 82 y.o. male with a chief complaint of Hemothorax .  Refer to the original H&P for additional details.  The current plan of care is to follow-up results of the patient's troponin which was negative. Patient will be discharged per Dr. Clarita CraneLourdes instructions for a pulmonary contusion.   Jennye MoccasinBrian S Quigley, MD 12/07/15 [phone]

## 2015-12-07 NOTE — ED Notes (Signed)
Pt given cup of water 

## 2015-12-11 ENCOUNTER — Ambulatory Visit: Payer: Medicare Other | Admitting: Certified Registered"

## 2015-12-11 ENCOUNTER — Encounter: Payer: Self-pay | Admitting: *Deleted

## 2015-12-11 ENCOUNTER — Ambulatory Visit
Admission: RE | Admit: 2015-12-11 | Discharge: 2015-12-11 | Disposition: A | Discharge status: Home / Self Care | Payer: Medicare Other | Source: Ambulatory Visit | Attending: Vascular Surgery | Admitting: Vascular Surgery

## 2015-12-11 ENCOUNTER — Encounter
Admission: RE | Disposition: A | Discharge status: Home / Self Care | Payer: Self-pay | Source: Ambulatory Visit | Attending: Vascular Surgery

## 2015-12-11 DIAGNOSIS — R251 Tremor, unspecified: Secondary | ICD-10-CM | POA: Insufficient documentation

## 2015-12-11 DIAGNOSIS — Z91041 Radiographic dye allergy status: Secondary | ICD-10-CM | POA: Insufficient documentation

## 2015-12-11 DIAGNOSIS — K5730 Diverticulosis of large intestine without perforation or abscess without bleeding: Secondary | ICD-10-CM | POA: Insufficient documentation

## 2015-12-11 DIAGNOSIS — R0602 Shortness of breath: Secondary | ICD-10-CM | POA: Insufficient documentation

## 2015-12-11 DIAGNOSIS — F419 Anxiety disorder, unspecified: Secondary | ICD-10-CM | POA: Insufficient documentation

## 2015-12-11 DIAGNOSIS — I509 Heart failure, unspecified: Secondary | ICD-10-CM | POA: Insufficient documentation

## 2015-12-11 DIAGNOSIS — I132 Hypertensive heart and chronic kidney disease with heart failure and with stage 5 chronic kidney disease, or end stage renal disease: Secondary | ICD-10-CM | POA: Insufficient documentation

## 2015-12-11 DIAGNOSIS — G4733 Obstructive sleep apnea (adult) (pediatric): Secondary | ICD-10-CM | POA: Insufficient documentation

## 2015-12-11 DIAGNOSIS — I2510 Atherosclerotic heart disease of native coronary artery without angina pectoris: Secondary | ICD-10-CM | POA: Insufficient documentation

## 2015-12-11 DIAGNOSIS — J90 Pleural effusion, not elsewhere classified: Secondary | ICD-10-CM | POA: Insufficient documentation

## 2015-12-11 DIAGNOSIS — R0600 Dyspnea, unspecified: Secondary | ICD-10-CM | POA: Insufficient documentation

## 2015-12-11 DIAGNOSIS — Z9841 Cataract extraction status, right eye: Secondary | ICD-10-CM | POA: Insufficient documentation

## 2015-12-11 DIAGNOSIS — Z96652 Presence of left artificial knee joint: Secondary | ICD-10-CM | POA: Insufficient documentation

## 2015-12-11 DIAGNOSIS — Z992 Dependence on renal dialysis: Secondary | ICD-10-CM | POA: Insufficient documentation

## 2015-12-11 DIAGNOSIS — I255 Ischemic cardiomyopathy: Secondary | ICD-10-CM | POA: Insufficient documentation

## 2015-12-11 DIAGNOSIS — K449 Diaphragmatic hernia without obstruction or gangrene: Secondary | ICD-10-CM | POA: Insufficient documentation

## 2015-12-11 DIAGNOSIS — Z9842 Cataract extraction status, left eye: Secondary | ICD-10-CM | POA: Insufficient documentation

## 2015-12-11 DIAGNOSIS — E1122 Type 2 diabetes mellitus with diabetic chronic kidney disease: Secondary | ICD-10-CM | POA: Insufficient documentation

## 2015-12-11 DIAGNOSIS — N186 End stage renal disease: Secondary | ICD-10-CM | POA: Insufficient documentation

## 2015-12-11 DIAGNOSIS — Z91018 Allergy to other foods: Secondary | ICD-10-CM | POA: Insufficient documentation

## 2015-12-11 DIAGNOSIS — M4800 Spinal stenosis, site unspecified: Secondary | ICD-10-CM | POA: Insufficient documentation

## 2015-12-11 DIAGNOSIS — J449 Chronic obstructive pulmonary disease, unspecified: Secondary | ICD-10-CM | POA: Insufficient documentation

## 2015-12-11 DIAGNOSIS — Z87891 Personal history of nicotine dependence: Secondary | ICD-10-CM | POA: Insufficient documentation

## 2015-12-11 DIAGNOSIS — K219 Gastro-esophageal reflux disease without esophagitis: Secondary | ICD-10-CM | POA: Insufficient documentation

## 2015-12-11 DIAGNOSIS — Z951 Presence of aortocoronary bypass graft: Secondary | ICD-10-CM | POA: Insufficient documentation

## 2015-12-11 DIAGNOSIS — M8580 Other specified disorders of bone density and structure, unspecified site: Secondary | ICD-10-CM | POA: Insufficient documentation

## 2015-12-11 DIAGNOSIS — M5126 Other intervertebral disc displacement, lumbar region: Secondary | ICD-10-CM | POA: Insufficient documentation

## 2015-12-11 DIAGNOSIS — D649 Anemia, unspecified: Secondary | ICD-10-CM | POA: Insufficient documentation

## 2015-12-11 DIAGNOSIS — E538 Deficiency of other specified B group vitamins: Secondary | ICD-10-CM | POA: Insufficient documentation

## 2015-12-11 DIAGNOSIS — J9811 Atelectasis: Secondary | ICD-10-CM | POA: Insufficient documentation

## 2015-12-11 DIAGNOSIS — E669 Obesity, unspecified: Secondary | ICD-10-CM | POA: Insufficient documentation

## 2015-12-11 DIAGNOSIS — E1142 Type 2 diabetes mellitus with diabetic polyneuropathy: Secondary | ICD-10-CM | POA: Insufficient documentation

## 2015-12-11 DIAGNOSIS — Z87442 Personal history of urinary calculi: Secondary | ICD-10-CM | POA: Insufficient documentation

## 2015-12-11 DIAGNOSIS — E785 Hyperlipidemia, unspecified: Secondary | ICD-10-CM | POA: Insufficient documentation

## 2015-12-11 DIAGNOSIS — I252 Old myocardial infarction: Secondary | ICD-10-CM | POA: Insufficient documentation

## 2015-12-11 DIAGNOSIS — M1990 Unspecified osteoarthritis, unspecified site: Secondary | ICD-10-CM | POA: Insufficient documentation

## 2015-12-11 HISTORY — PX: BASCILIC VEIN TRANSPOSITION: SHX5742

## 2015-12-11 LAB — POCT I-STAT 4, (NA,K, GLUC, HGB,HCT)
GLUCOSE: 125 mg/dL — AB (ref 65–99)
HEMATOCRIT: 38 % — AB (ref 39.0–52.0)
Hemoglobin: 12.9 g/dL — ABNORMAL LOW (ref 13.0–17.0)
Potassium: 5.7 mmol/L — ABNORMAL HIGH (ref 3.5–5.1)
SODIUM: 137 mmol/L (ref 135–145)

## 2015-12-11 LAB — TYPE AND SCREEN
ABO/RH(D): A NEG
Antibody Screen: NEGATIVE

## 2015-12-11 LAB — GLUCOSE, CAPILLARY: Glucose-Capillary: 116 mg/dL — ABNORMAL HIGH (ref 65–99)

## 2015-12-11 SURGERY — TRANSPOSITION, VEIN, BASILIC
Anesthesia: General | Site: Arm Upper | Laterality: Left

## 2015-12-11 MED ORDER — ONDANSETRON HCL 4 MG/2ML IJ SOLN
INTRAMUSCULAR | Status: DC | PRN
  Administered 2015-12-11: 4 mg via INTRAVENOUS

## 2015-12-11 MED ORDER — HEPARIN SODIUM (PORCINE) 5000 UNIT/ML IJ SOLN
INTRAMUSCULAR | Status: AC
  Filled 2015-12-11: qty 1

## 2015-12-11 MED ORDER — EPHEDRINE SULFATE 50 MG/ML IJ SOLN
INTRAMUSCULAR | Status: DC | PRN
  Administered 2015-12-11: 5 mg via INTRAVENOUS
  Administered 2015-12-11: 10 mg via INTRAVENOUS
  Administered 2015-12-11: 5 mg via INTRAVENOUS

## 2015-12-11 MED ORDER — FAMOTIDINE 20 MG PO TABS
20.0000 mg | ORAL_TABLET | Freq: Once | ORAL | Status: AC
  Administered 2015-12-11: 20 mg via ORAL

## 2015-12-11 MED ORDER — PHENYLEPHRINE HCL 10 MG/ML IJ SOLN
INTRAMUSCULAR | Status: DC | PRN
  Administered 2015-12-11 (×4): 100 ug via INTRAVENOUS
  Administered 2015-12-11: 200 ug via INTRAVENOUS
  Administered 2015-12-11: 100 ug via INTRAVENOUS
  Administered 2015-12-11: 200 ug via INTRAVENOUS
  Administered 2015-12-11: 100 ug via INTRAVENOUS
  Administered 2015-12-11: 200 ug via INTRAVENOUS

## 2015-12-11 MED ORDER — METOCLOPRAMIDE HCL 5 MG/ML IJ SOLN: 10.0000 mg | Freq: Once | INTRAMUSCULAR | Status: DC | PRN

## 2015-12-11 MED ORDER — KETAMINE HCL 50 MG/ML IJ SOLN
INTRAMUSCULAR | Status: DC | PRN
  Administered 2015-12-11: 25 mg via INTRAVENOUS
  Administered 2015-12-11: 25 mg via INTRAMUSCULAR

## 2015-12-11 MED ORDER — BUPIVACAINE HCL (PF) 0.5 % IJ SOLN
INTRAMUSCULAR | Status: AC
  Filled 2015-12-11: qty 30

## 2015-12-11 MED ORDER — FENTANYL CITRATE (PF) 100 MCG/2ML IJ SOLN: 25.0000 ug | INTRAMUSCULAR | Status: DC | PRN

## 2015-12-11 MED ORDER — ROCURONIUM BROMIDE 100 MG/10ML IV SOLN
INTRAVENOUS | Status: DC | PRN
  Administered 2015-12-11: 30 mg via INTRAVENOUS
  Administered 2015-12-11 (×2): 10 mg via INTRAVENOUS

## 2015-12-11 MED ORDER — FENTANYL CITRATE (PF) 100 MCG/2ML IJ SOLN
INTRAMUSCULAR | Status: DC | PRN
  Administered 2015-12-11 (×4): 50 ug via INTRAVENOUS

## 2015-12-11 MED ORDER — FAMOTIDINE 20 MG PO TABS
ORAL_TABLET | ORAL | Status: AC
  Filled 2015-12-11: qty 1

## 2015-12-11 MED ORDER — DEXTROSE 5 % IV SOLN
2.0000 g | INTRAVENOUS | Status: AC
  Administered 2015-12-11: 2 g via INTRAVENOUS
  Filled 2015-12-11: qty 20

## 2015-12-11 MED ORDER — HYDROCODONE-ACETAMINOPHEN 5-325 MG PO TABS: 1.0000 | ORAL_TABLET | Freq: Four times a day (QID) | ORAL | Status: DC | PRN

## 2015-12-11 MED ORDER — PROPOFOL 10 MG/ML IV BOLUS
INTRAVENOUS | Status: DC | PRN
  Administered 2015-12-11 (×2): 100 mg via INTRAVENOUS

## 2015-12-11 MED ORDER — ALBUTEROL SULFATE HFA 108 (90 BASE) MCG/ACT IN AERS
INHALATION_SPRAY | RESPIRATORY_TRACT | Status: DC | PRN
  Administered 2015-12-11: 4 via RESPIRATORY_TRACT

## 2015-12-11 MED ORDER — SODIUM CHLORIDE 0.9 % IV SOLN
INTRAVENOUS | Status: DC | PRN
  Administered 2015-12-11: 100 mL via INTRAMUSCULAR

## 2015-12-11 MED ORDER — SUGAMMADEX SODIUM 200 MG/2ML IV SOLN
INTRAVENOUS | Status: DC | PRN
  Administered 2015-12-11: 150 mg via INTRAVENOUS

## 2015-12-11 MED ORDER — SODIUM CHLORIDE 0.9 % IV SOLN
INTRAVENOUS | Status: DC
  Administered 2015-12-11: 11:00:00 via INTRAVENOUS

## 2015-12-11 SURGICAL SUPPLY — 54 items
APPLIER CLIP 11 MED OPEN (CLIP)
APPLIER CLIP 9.375 SM OPEN (CLIP)
BAG DECANTER FOR FLEXI CONT (MISCELLANEOUS) ×3 IMPLANT
BLADE SURG 15 STRL LF DISP TIS (BLADE) ×1 IMPLANT
BLADE SURG 15 STRL SS (BLADE) ×2
BLADE SURG SZ11 CARB STEEL (BLADE) ×3 IMPLANT
BOOT SUTURE AID YELLOW STND (SUTURE) ×3 IMPLANT
BRUSH SCRUB 4% CHG (MISCELLANEOUS) ×3 IMPLANT
CANISTER SUCT 1200ML W/VALVE (MISCELLANEOUS) ×3 IMPLANT
CHLORAPREP W/TINT 26ML (MISCELLANEOUS) ×3 IMPLANT
CLIP APPLIE 11 MED OPEN (CLIP) IMPLANT
CLIP APPLIE 9.375 SM OPEN (CLIP) IMPLANT
DECANTER SPIKE VIAL GLASS SM (MISCELLANEOUS) ×3 IMPLANT
DRESSING SURGICEL FIBRLLR 1X2 (HEMOSTASIS) ×1 IMPLANT
DRSG SURGICEL FIBRILLAR 1X2 (HEMOSTASIS) ×3
ELECT CAUTERY BLADE 6.4 (BLADE) ×3 IMPLANT
ELECT REM PT RETURN 9FT ADLT (ELECTROSURGICAL) ×3
ELECTRODE REM PT RTRN 9FT ADLT (ELECTROSURGICAL) ×1 IMPLANT
GEL ULTRASOUND 20GR AQUASONIC (MISCELLANEOUS) IMPLANT
GLOVE BIO SURGEON STRL SZ7 (GLOVE) ×3 IMPLANT
GLOVE INDICATOR 7.5 STRL GRN (GLOVE) ×3 IMPLANT
GLOVE SURG SYN 8.0 (GLOVE) ×12 IMPLANT
GOWN L4 XLG 20 PK N/S (GOWN DISPOSABLE) ×3 IMPLANT
GOWN STRL REUS W/ TWL LRG LVL3 (GOWN DISPOSABLE) ×2 IMPLANT
GOWN STRL REUS W/TWL LRG LVL3 (GOWN DISPOSABLE) ×4
IV NS 500ML (IV SOLUTION) ×2
IV NS 500ML BAXH (IV SOLUTION) ×1 IMPLANT
KIT RM TURNOVER STRD PROC AR (KITS) ×3 IMPLANT
LABEL OR SOLS (LABEL) ×3 IMPLANT
LIQUID BAND (GAUZE/BANDAGES/DRESSINGS) ×9 IMPLANT
LOOP RED MAXI  1X406MM (MISCELLANEOUS) ×2
LOOP VESSEL MAXI 1X406 RED (MISCELLANEOUS) ×1 IMPLANT
LOOP VESSEL MINI 0.8X406 BLUE (MISCELLANEOUS) ×2 IMPLANT
LOOPS BLUE MINI 0.8X406MM (MISCELLANEOUS) ×4
NEEDLE FILTER BLUNT 18X 1/2SAF (NEEDLE) ×2
NEEDLE FILTER BLUNT 18X1 1/2 (NEEDLE) ×1 IMPLANT
PACK EXTREMITY ARMC (MISCELLANEOUS) ×3 IMPLANT
PAD PREP 24X41 OB/GYN DISP (PERSONAL CARE ITEMS) ×3 IMPLANT
STOCKINETTE 4 (MISCELLANEOUS) ×3 IMPLANT
STOCKINETTE STRL 4IN 9604848 (GAUZE/BANDAGES/DRESSINGS) ×3 IMPLANT
SUT MNCRL+ 5-0 UNDYED PC-3 (SUTURE) ×2 IMPLANT
SUT MONOCRYL 5-0 (SUTURE) ×4
SUT PROLENE 6 0 BV (SUTURE) ×12 IMPLANT
SUT SILK 2 0 (SUTURE) ×2
SUT SILK 2 0 SH (SUTURE) ×3 IMPLANT
SUT SILK 2-0 18XBRD TIE 12 (SUTURE) ×1 IMPLANT
SUT SILK 3 0 (SUTURE) ×4
SUT SILK 3-0 18XBRD TIE 12 (SUTURE) ×2 IMPLANT
SUT SILK 4 0 (SUTURE) ×2
SUT SILK 4-0 18XBRD TIE 12 (SUTURE) ×1 IMPLANT
SUT VIC AB 3-0 SH 27 (SUTURE) ×6
SUT VIC AB 3-0 SH 27X BRD (SUTURE) ×3 IMPLANT
SYR 20CC LL (SYRINGE) ×3 IMPLANT
SYR 3ML LL SCALE MARK (SYRINGE) ×3 IMPLANT

## 2015-12-11 NOTE — H&P (Signed)
[facility] History & Physical Update  The patient was interviewed and re-examined.  The patient's previous History and Physical has been reviewed and is unchanged.  There is no change in the plan of care. We plan to proceed with the scheduled procedure.  Cowen Pesqueira, Latina CraverGregory G, MD  12/11/2015, 10:30 AM

## 2015-12-11 NOTE — Anesthesia Postprocedure Evaluation (Signed)
Anesthesia Post Note  Patient: Kenneth [name]  Procedure(s) Performed: Procedure(s) (LRB): BASCILIC VEIN TRANSPOSITION ( STAGE 1 ) (Left)  Patient location during evaluation: PACU Anesthesia Type: General Level of consciousness: awake Pain management: pain level controlled Vital Signs Assessment: post-procedure vital signs reviewed and stable Respiratory status: spontaneous breathing Cardiovascular status: blood pressure returned to baseline Postop Assessment: no headache Anesthetic complications: no    Last Vitals:  Filed Vitals:   12/11/15 1042 12/11/15 1504  BP: 154/94 142/99  Pulse: 81 94  Temp: 36.7 C 36.2 C  Resp: 16 17    Last Pain: There were no vitals filed for this visit.               [name]

## 2015-12-11 NOTE — Op Note (Signed)
OPERATIVE NOTE   PROCEDURE: left  basilic vein transposition (brachiobasilic arteriovenous fistula) placement  PRE-OPERATIVE DIAGNOSIS: End stage renal disease  POST-OPERATIVE DIAGNOSIS: Same  SURGEON: Renford DillsGregory G. [name], M.D.  ASSISTANT(S): Ms. Raul DelKim Stegmayer  ANESTHESIA: general  ESTIMATED BLOOD LOSS: Minimal cc  FINDING(S): Basilic vein of Kenneth caliber  SPECIMEN(S):  None  INDICATIONS:   Kenneth [name] is a 82 y.o. male who presents with stage IV renal insufficiency. She is therefore undergoing creation of a fistula so that she will have adequate dialysis access in the future and avoid catheter placement..  The patient is scheduled for left second stage basilic vein transposition.  The patient is aware the risks include but are not limited to: bleeding, infection, steal syndrome, nerve damage, failure to mature, and need for additional procedures.  The patient is aware of the risks of the procedure and elects to proceed forward.  DESCRIPTION: After full informed written consent was obtained from the patient, the patient was brought back to the operating room and placed supine upon the operating table.  Prior to induction, the patient received IV antibiotics.   After obtaining adequate anesthesia, the patient's left arm was then prepped and draped in the standard fashion. Appropriate timeout is called  Preoperative vein mapping indicates a basilic vein which is 4 mm or larger throughout its course and therefore basilic transposition will be performed in a single stage.  I turned my attention first to identifying the patient's basilic vein.  I made an longitudinal incision over the vein from the level of the antecubital fossa up to its axillary extent.  I carefully dissected the vein away from its adjacent nerves.  The entirety of the basilic vein was mobilized and the vein was then marked with a surgical marker to maintain appropriate orientation. It was then irrigated with  heparinized saline.   A plane on top of the biceps fascia was dissected with electrocautery. The vein was then approximated to the skin in order to plan for the position of the arterial anastomosis.  The deep subcutaneous tissue was inspected for bleeding.  The basilic vein was then pulled subcutaneously using a Kelly clamp after a small counterincision was made at the apex of the tunnel.   The brachial artery is then dissected and looped with Silastic Vessel loops. Brachial artery is then controlled with the vessel loops and arteriotomy is made with an 11 blade and extended with Potts scissors and 6-0 Prolene stay sutures were placed. End vein to side brachial artery anastomosis is then fashioned with running 6-0 Prolene. Flushing maneuvers performed and flow was established through the fistula. Excellent thrill is noted within the vein 2+ radial pulses maintained at the wrist.   Bleeding was controlled with electrocautery and placement of large pieces of thrombin and gelfoam.  I washed out the surgical site after waiting a few minutes, and there was no further bleeding.  The fascia was reapproximated with interrupted stitches of 3-0 Vicryl to eliminate some of the deep space.  The superficial subcutaneous tissue was then reapproximated along the incision line with a running stitch of 3-0 Vicryl.  The skin was then reapproximated with a running subcuticular of 4-0 Monocryl.  The skin was then cleaned, dried, and reinforced with Dermabond.    The patient tolerated this procedure well.   COMPLICATIONS: None  CONDITION: Kenneth RegisterGood  [name], Kenneth G  02/27/2015, 9:46 AM

## 2015-12-11 NOTE — Progress Notes (Signed)
Notified Dr. Gilda CreaseSchnier new history and physical needed.

## 2015-12-11 NOTE — Anesthesia Preprocedure Evaluation (Signed)
Anesthesia Evaluation  Patient identified by MRN, date of birth, ID band Patient awake    Reviewed: Allergy & Precautions, NPO status , Patient's Chart, lab work & pertinent test results  Airway Mallampati: II  TM Distance: >3 FB Neck ROM: Limited    Dental  (+) Upper Dentures, Lower Dentures   Pulmonary shortness of breath and with exertion, sleep apnea and Continuous Positive Airway Pressure Ventilation , COPD,  COPD inhaler, former smoker,    Pulmonary exam normal        Cardiovascular Exercise Tolerance: Poor hypertension, Pt. on medications and Pt. on home beta blockers + angina with exertion + CAD, + Past MI and +CHF   Rhythm:Regular Rate:Normal  EF 48%.   Neuro/Psych Anxiety Peripheral neuropathy.  Neuromuscular disease    GI/Hepatic GERD  Medicated and Controlled,  Endo/Other  diabetes, Type 2BG 116, K+ 5.7.  Renal/GU ESRF and DialysisRenal disease     Musculoskeletal  (+) Arthritis , Osteoarthritis,    Abdominal Normal abdominal exam  (+)  Abdomen: soft.    Peds  Hematology  (+) anemia , Hb 12.9.   Anesthesia Other Findings   Reproductive/Obstetrics                             Anesthesia Physical Anesthesia Plan  ASA: IV  Anesthesia Plan: General   Post-op Pain Management:    Induction: Intravenous  Airway Management Planned: LMA  Additional Equipment:   Intra-op Plan:   Post-operative Plan: Extubation in OR  Informed Consent: I have reviewed the patients History and Physical, chart, labs and discussed the procedure including the risks, benefits and alternatives for the proposed anesthesia with the patient or authorized representative who has indicated his/her understanding and acceptance.     Plan Discussed with: CRNA  Anesthesia Plan Comments:         Anesthesia Quick Evaluation

## 2015-12-11 NOTE — H&P (Signed)
[name] Admission History & Physical  MRN : [id number]  Kenneth [name] is a 82 y.o. (Jul 18, 1928) male who presents with chief complaint of need dialysis access.  History of Present Illness: Patient presents today for creation of a left arm basilic transposition. He denies left arm pain and no recent trauma. He's been maintained with a left IJ tunneled catheter catheter is working well at this time. Patient denies fever chills.  Current Facility-Administered Medications  Medication Dose Route Frequency Provider Last Rate Last Dose  . 0.9 %  sodium chloride infusion   Intravenous Continuous Yves DillPaul Carroll, MD 50 mL/hr at 12/11/15 1114    . ceFAZolin (ANCEF) 2 g in dextrose 5 % 50 mL IVPB  2 g Intravenous On Call to OR Cala BradfordKimberly A Stegmayer, PA-C      . famotidine (PEPCID) tablet 20 mg  20 mg Oral Once Yves DillPaul Carroll, MD        Past Medical History  Diagnosis Date  . Diabetes mellitus   . Hyperlipidemia   . Hypertension   . Coronary artery disease     a. 2vCABG LIMA-LAD, SVG-OM1 in 1999; b. Lexiscan myoview (5/10) showed EF 45%, HK apex & lateral wall, fixed perfusion defect involving apex, inferoapex, & inferolateral wall, also some inferoapical ischemia; LHC (5/10) showed patent LIMA-LAD and totally occluded SVG-OM1, 99% pLAD, 40% ostial CFX, 40% mCFX, 30% OM1, 20% pRCA  . History of MRI of chest 11/10    a. Cardiac; EF 48%, mid anterolateral and inferolateral walls, apical lateral wall, apical septal wall, and true apex were all think and akinetic; 51-75% thickness subendocardial delayed enhancement in mid anterolateral and inferolateral walls, full thickness enhancement in apical laterla and apical septal walls and true apex  . Nephrolithiasis   . Spinal stenosis   . B12 deficiency   . Obesity   . Ischemic cardiomyopathy     a. likely ICM, EF 55-60%, RWMA could not be excluded, nl PASP, nl CVP  . ESRD on hemodialysis (HCC)     a. once weekly as of 08/2015  .  OSA (obstructive sleep apnea)     Moderate CPAP  . Shortness of breath dyspnea   . Anxiety   . GERD (gastroesophageal reflux disease)   . Arthritis   . Anemia   . Occasional tremors     Past Surgical History  Procedure Laterality Date  . Back surgery      Multiple, with spinal stenosis  . Cataract extraction    . Coronary artery bypass graft  1999    In [place] with LIMA-LAD and SVG-OM1  . Total knee arthroplasty  5/10  . Peripheral vascular catheterization N/A 09/01/2015    Procedure: Dialysis/Perma Catheter Insertion;  Surgeon: Renford DillsGregory G Schnier, MD;  Location: [facility] INVASIVE CV LAB;  Service: Cardiovascular;  Laterality: N/A;  . Eye surgery Bilateral     Cataract extraction with IOL  . Cardiac catheterization    . Joint replacement Left     Total Knee Replacement  . Insertion of dialysis catheter Right     [facility] Wyn Qu[place]rew[facility]    Social History Social History  Substance Use Topics  . Smoking status: Former Smoker    Types: Cigarettes  . Smokeless tobacco: Never Used  . Alcohol Use: No    Family History Family History  Problem Relation Age of Onset  . Kidney failure Mother   . Heart failure Father    no family history of bleeding clotting disorders, porphyria or  autoimmune disease  Allergies  Allergen Reactions  . Beef-Derived Products Hives  . Iodine Hives  . Ivp Dye [Iodinated Diagnostic Agents] Hives     REVIEW OF SYSTEMS (Negative unless checked)  Constitutional: [] Weight loss  [] Fever  [] Chills Cardiac: [] Chest pain   [] Chest pressure   [] Palpitations   [] Shortness of breath when laying flat   [] Shortness of breath at rest   [] Shortness of breath with exertion. Vascular:  [] Pain in legs with walking   [] Pain in legs at rest   [] Pain in legs when laying flat   [] Claudication   [] Pain in feet when walking  [] Pain in feet at rest  [] Pain in feet when laying flat   [] History of DVT   [] Phlebitis   [] Swelling in legs   [] Varicose veins   [] Non-healing  ulcers Pulmonary:   [] Uses home oxygen   [] Productive cough   [] Hemoptysis   [] Wheeze  [] COPD   [] Asthma Neurologic:  [] Dizziness  [] Blackouts   [] Seizures   [] History of stroke   [] History of TIA  [] Aphasia   [] Temporary blindness   [] Dysphagia   [] Weakness or numbness in arms   [] Weakness or numbness in legs Musculoskeletal:  [] Arthritis   [] Joint swelling   [] Joint pain   [] Low back pain Hematologic:  [] Easy bruising  [] Easy bleeding   [] Hypercoagulable state   [] Anemic  [] Hepatitis Gastrointestinal:  [] Blood in stool   [] Vomiting blood  [] Gastroesophageal reflux/heartburn   [] Difficulty swallowing. Genitourinary:  [] Chronic kidney disease   [] Difficult urination  [] Frequent urination  [] Burning with urination   [] Blood in urine Skin:  [] Rashes   [] Ulcers   [] Wounds Psychological:  [] History of anxiety   []  History of major depression.  Physical Examination  Filed Vitals:   12/11/15 1042  BP: 154/94  Pulse: 81  Temp: 98.1 F (36.7 C)  TempSrc: Tympanic  Resp: 16  SpO2: 96%   There is no weight on file to calculate BMI. Gen: WD/WN, NAD Head: Starr/AT, No temporalis wasting. Prominent temp pulse not noted. Ear/Nose/Throat: Hearing grossly intact, nares w/o erythema or drainage, oropharynx w/o Erythema/Exudate,  Eyes: PERRLA, EOMI.  Neck: Supple, no nuchal rigidity.  No bruit or JVD.  Pulmonary:  Good air movement, clear to auscultation bilaterally, no use of accessory muscles.  Cardiac: RRR, normal S1, S2, no Murmurs, rubs or gallops. Vascular: Left arm clean dry and intact Vessel Right Left  Radial Palpable Palpable  Ulnar Palpable Palpable  Brachial Palpable Palpable  Gastrointestinal: soft, non-tender/non-distended. No guarding/reflex.  Musculoskeletal: M/S 5/5 throughout.  Extremities without ischemic changes.  No deformity or atrophy.  Neurologic: CN 2-12 intact. Pain and light touch intact in extremities.  Symmetrical.  Speech is fluent. Motor exam as listed  above. Psychiatric: Judgment intact, Mood & affect appropriate for pt's clinical situation. Dermatologic: No rashes or ulcers noted.  No cellulitis or open wounds. Lymph : No Cervical, Axillary, or Inguinal lymphadenopathy.     CBC Lab Results  Component Value Date   WBC 4.9 12/07/2015   HGB 12.9* 12/11/2015   HCT 38.0* 12/11/2015   MCV 93.1 12/07/2015   PLT 246 12/07/2015    BMET    Component Value Date/Time   NA 137 12/11/2015 1054   NA 134* 02/22/2014 1410   K 5.7* 12/11/2015 1054   K 5.0 02/22/2014 1805   CL 99* 12/07/2015 1628   CL 105 02/22/2014 1410   CO2 25 12/07/2015 1628   CO2 23 02/22/2014 1410   GLUCOSE 125* 12/11/2015 1054  GLUCOSE 159* 02/22/2014 1410   BUN 29* 12/07/2015 1628   BUN 31* 02/22/2014 1410   CREATININE 4.02* 12/07/2015 1628   CREATININE 3.02* 02/22/2014 1410   CALCIUM 7.9* 12/07/2015 1628   CALCIUM 8.3* 02/22/2014 1410   GFRNONAA 12* 12/07/2015 1628   GFRNONAA 18* 02/22/2014 1410   GFRAA 14* 12/07/2015 1628   GFRAA 21* 02/22/2014 1410   Estimated Creatinine Clearance: 12.9 mL/min (by C-G formula based on Cr of 4.02).  COAG Lab Results  Component Value Date   INR 1.13 11/10/2015   INR 1.17 08/24/2015    Radiology Ct Abdomen Pelvis Wo Contrast  11/18/2015  CLINICAL DATA:  Gross hematuria 3 weeks ago, bilateral low back pain for 2 weeks, history of kidney stones EXAM: CT ABDOMEN AND PELVIS WITHOUT CONTRAST TECHNIQUE: Multidetector CT imaging of the abdomen and pelvis was performed following the standard protocol without IV contrast. COMPARISON:  02/10/2014 FINDINGS: Lower chest: Bilateral small pleural effusion. Bilateral lower lobe posterior atelectasis left greater than right. Hepatobiliary: Unenhanced liver shows no biliary ductal dilatation. No calcified gallstones are noted within gallbladder. No CBD dilatation. Pancreas: Unenhanced pancreas is unremarkable. Spleen: Unenhanced spleen is stable in size.  No perisplenic fluid.  Adrenals/Urinary Tract: No adrenal gland mass is noted. Unenhanced kidneys are symmetrical in size. Again noted bilateral mild renal cortical thinning probable due to atrophy. Stable chronic mild perinephric stranding. There is no hydronephrosis or hydroureter. No calcified ureteral calculi. No nephrolithiasis. No calcified calculi are noted within under distended urinary bladder. Stomach/Bowel: No gastric outlet obstruction. The study is limited without IV or oral contrast. No small bowel obstruction. Normal appendix. No pericecal inflammation. Moderate stool noted in right colon and transverse colon. Scattered descending colon diverticula. Multiple sigmoid colon diverticula. No evidence of acute diverticulitis. Vascular/Lymphatic: Atherosclerotic calcifications of abdominal aorta and iliac arteries. No aortic aneurysm. There is no mesenteric or retroperitoneal adenopathy. Reproductive: Prostate gland measures 3.3 by 5 cm. Other: Trace free fluid noted within posterior pelvis. There is no evidence of free abdominal air. No evidence of inguinal hernia or adenopathy. Musculoskeletal: Sagittal images of the spine shows diffuse osteopenia. Degenerative changes are noted lumbar spine. Mild disc space flattening with significant anterior spurring at L3-L4 level. Mild disc space flattening and mild posterior disc bulge with mild anterior spurring at L4-L5 level. IMPRESSION: 1. No nephrolithiasis. Again noted mild bilateral cortical thinning probable due to atrophy. No hydronephrosis or hydroureter. No calcified ureteral calculi. 2. No calcified calculi are noted within urinary bladder. 3. Normal appendix. No pericecal inflammation. Moderate stool noted in right colon and transverse colon. 4. Scattered diverticula are noted descending colon. Multiple sigmoid colon diverticula. No evidence of acute diverticulitis. 5. No small bowel obstruction. 6. Degenerative changes lumbar spine. Electronically Signed   By: Natasha Mead  M.D.   On: 11/18/2015 16:01   Dg Chest 2 View  12/07/2015  CLINICAL DATA:  LEFT side chest pain, dialysis patient who underwent dialysis today, recent fall, history diabetes mellitus, hypertension, coronary artery disease, end- stage renal disease, GERD, ischemic cardiomyopathy EXAM: CHEST  2 VIEW COMPARISON:  08/30/2015 FINDINGS: RIGHT jugular dual-lumen central venous catheter with tip projecting over cavoatrial junction. Normal heart size, mediastinal contours, and pulmonary vascularity. Postsurgical changes of median sternotomy and CABG. Chronic bronchitic and emphysematous changes consistent with COPD. LEFT pleural effusion and bibasilar atelectasis. Improved aeration RIGHT base since previous study. No acute pulmonary edema, segmental consolidation or pneumothorax. Bones demineralized with scattered degenerative disc disease changes of thoracic spine and RIGHT AC  joint degenerative changes. IMPRESSION: COPD changes with LEFT pleural effusion and bibasilar atelectasis. Electronically Signed   By: Ulyses Southward M.D.   On: 12/07/2015 17:06   Dg Ribs Unilateral W/chest Left  12/07/2015  CLINICAL DATA:  Fall 8 days ago with persistent left chest pain, initial encounter EXAM: LEFT RIBS AND CHEST - 3+ VIEW COMPARISON:  Film from earlier in the same day. FINDINGS: Left lung is well aerated. Mild left basilar atelectasis is again seen. No acute rib fracture is noted. Postsurgical changes are noted as well as a prior right dialysis catheter. IMPRESSION: Left basilar changes similar to that seen on the prior exam. No definitive rib fracture is noted. Electronically Signed   By: Alcide Clever M.D.   On: 12/07/2015 18:03   Ct Chest Wo Contrast  12/07/2015  CLINICAL DATA:  Left chest wall pain.  Fall last week. EXAM: CT CHEST WITHOUT CONTRAST TECHNIQUE: Multidetector CT imaging of the chest was performed following the standard protocol without IV contrast. COMPARISON:  Radiographs earlier this day. FINDINGS:  Mediastinum/Lymph Nodes: Right internal jugular catheter tips at the atrial caval junction. No evidence of acute aortic injury allowing for lack contrast. Patient is post CABG. Mild atherosclerosis of normal caliber thoracic aorta. Increased number of multiple small mediastinal mediastinal nodes not enlarged by size criteria, largest measuring 9 mm short axis. Heart is normal in size. No pericardial effusion. Lungs/Pleura: Small bilateral pleural effusions, left greater than right. Adjacent compressive atelectasis in both lower lobes. No pneumothorax. Mild to moderate emphysema most prominent at the lung apices. No evidence pulmonary contusion. Upper abdomen: Small hiatal hernia. Homogeneous attenuation throughout the spleen and included liver. Lobular contours of the left kidney, with rounded appearance of the anterior left kidney similar to recent abdominal CT 11/18/2015. Cystic density in the pancreatic head/ neck measures approximately 2 cm. This measures simple fluid density. Musculoskeletal: No acute rib fracture, with special attention to the left. Post median sternotomy without evidence of sternal fracture. Multilevel degenerative change throughout the thoracic spine without acute fracture. No confluent chest wall hematoma. IMPRESSION: 1. No rib fracture or acute traumatic abnormality. 2. Small bilateral pleural effusions, left greater than right with adjacent compressive atelectasis in the dependent lungs. 3. Mild to moderate emphysema and chronic lung disease. 4. Cystic density in the pancreatic head/neck measures approximately 2 cm. This may be an incidental IPMN (intraductal papillary mucinous neoplasm), and of questionable clinical significance in this 82 year old patient. Further characterization could be considered on a nonemergent basis with MRI. Electronically Signed   By: Rubye Oaks M.D.   On: 12/07/2015 18:50    Assessment/Plan 1.  End-stage renal disease requiring hemodialysis:  Patient  will continue dialysis therapy without further interruption if a successful thrombectomy is not achieved then catheter will be placed. Dialysis has already been arranged since the patient missed their previous session 2.  Coronary artery disease:  EKG will be monitored. Nitrates will be used if needed. The patient's oral cardiac medications will be continued. 3.  Hypertension:  Patient will continue medical management; nephrology is following no changes in oral medications. 4. Diabetes mellitus:  Glucose will be monitored and oral medications been held this morning once the patient has undergone the patient's procedure po intake will be reinitiated and again Accu-Cheks will be used to assess the blood glucose level and treat as needed. The patient will be restarted on the patient's usual hypoglycemic regime 5.  Hyperlipidemia:  Continue statin     Schnier, Latina Craver, MD  12/11/2015 11:15 AM

## 2015-12-11 NOTE — Anesthesia Procedure Notes (Signed)
Procedure Name: Intubation Performed by: Malva CoganBEANE, Taray Normoyle Pre-anesthesia Checklist: Patient identified, Patient being monitored, Timeout performed, Emergency Drugs available and Suction available Patient Re-evaluated:Patient Re-evaluated prior to inductionOxygen Delivery Method: Circle system utilized Preoxygenation: Pre-oxygenation with 100% oxygen Intubation Type: IV induction Ventilation: Oral airway inserted - appropriate to patient size Laryngoscope Size: Mac and 3 Grade View: Grade I Tube type: Oral Tube size: 7.0 mm Number of attempts: 1 Airway Equipment and Method: Stylet Placement Confirmation: ETT inserted through vocal cords under direct vision,  positive ETCO2 and breath sounds checked- equal and bilateral Secured at: 23 cm Tube secured with: Tape Dental Injury: Teeth and Oropharynx as per pre-operative assessment

## 2015-12-11 NOTE — OR Nursing (Signed)
Patient with a strong thrill at upper left arm.

## 2015-12-11 NOTE — Discharge Instructions (Signed)

## 2015-12-11 NOTE — Transfer of Care (Signed)
Immediate Anesthesia Transfer of Care Note  Patient: Kenneth [name]  Procedure(s) Performed: Procedure(s): BASCILIC VEIN TRANSPOSITION ( STAGE 1 ) (Left)  Patient Location: PACU  Anesthesia Type:General  Level of Consciousness: awake and alert   Airway & Oxygen Therapy: Patient Spontanous Breathing and Patient connected to face mask oxygen  Post-op Assessment: Report given to RN  Post vital signs: Reviewed  Last Vitals:  Filed Vitals:   12/11/15 1042 12/11/15 1504  BP: 154/94 142/99  Pulse: 81 94  Temp: 36.7 C 36.2 C  Resp: 16 17    Complications: No apparent anesthesia complications

## 2015-12-14 ENCOUNTER — Encounter: Payer: Self-pay | Admitting: Vascular Surgery

## 2015-12-15 ENCOUNTER — Ambulatory Visit: Payer: Medicare Other | Admitting: Cardiovascular Disease

## 2016-02-17 ENCOUNTER — Other Ambulatory Visit: Payer: Self-pay | Admitting: Vascular Surgery

## 2016-02-25 ENCOUNTER — Encounter
Admission: RE | Disposition: A | Discharge status: Home / Self Care | Payer: Self-pay | Source: Ambulatory Visit | Attending: Vascular Surgery

## 2016-02-25 ENCOUNTER — Ambulatory Visit
Admission: RE | Admit: 2016-02-25 | Discharge: 2016-02-25 | Disposition: A | Discharge status: Home / Self Care | Payer: Medicare Other | Source: Ambulatory Visit | Attending: Vascular Surgery | Admitting: Vascular Surgery

## 2016-02-25 DIAGNOSIS — Z8249 Family history of ischemic heart disease and other diseases of the circulatory system: Secondary | ICD-10-CM | POA: Insufficient documentation

## 2016-02-25 DIAGNOSIS — Z992 Dependence on renal dialysis: Secondary | ICD-10-CM | POA: Diagnosis not present

## 2016-02-25 DIAGNOSIS — R0602 Shortness of breath: Secondary | ICD-10-CM | POA: Insufficient documentation

## 2016-02-25 DIAGNOSIS — Z9841 Cataract extraction status, right eye: Secondary | ICD-10-CM | POA: Diagnosis not present

## 2016-02-25 DIAGNOSIS — Z888 Allergy status to other drugs, medicaments and biological substances status: Secondary | ICD-10-CM | POA: Insufficient documentation

## 2016-02-25 DIAGNOSIS — Z841 Family history of disorders of kidney and ureter: Secondary | ICD-10-CM | POA: Insufficient documentation

## 2016-02-25 DIAGNOSIS — I2510 Atherosclerotic heart disease of native coronary artery without angina pectoris: Secondary | ICD-10-CM | POA: Insufficient documentation

## 2016-02-25 DIAGNOSIS — F419 Anxiety disorder, unspecified: Secondary | ICD-10-CM | POA: Diagnosis not present

## 2016-02-25 DIAGNOSIS — M1990 Unspecified osteoarthritis, unspecified site: Secondary | ICD-10-CM | POA: Diagnosis not present

## 2016-02-25 DIAGNOSIS — Z91018 Allergy to other foods: Secondary | ICD-10-CM | POA: Diagnosis not present

## 2016-02-25 DIAGNOSIS — I255 Ischemic cardiomyopathy: Secondary | ICD-10-CM | POA: Insufficient documentation

## 2016-02-25 DIAGNOSIS — Z96652 Presence of left artificial knee joint: Secondary | ICD-10-CM | POA: Diagnosis not present

## 2016-02-25 DIAGNOSIS — N186 End stage renal disease: Secondary | ICD-10-CM | POA: Diagnosis not present

## 2016-02-25 DIAGNOSIS — Z9842 Cataract extraction status, left eye: Secondary | ICD-10-CM | POA: Diagnosis not present

## 2016-02-25 DIAGNOSIS — I120 Hypertensive chronic kidney disease with stage 5 chronic kidney disease or end stage renal disease: Secondary | ICD-10-CM | POA: Diagnosis not present

## 2016-02-25 DIAGNOSIS — E669 Obesity, unspecified: Secondary | ICD-10-CM | POA: Insufficient documentation

## 2016-02-25 DIAGNOSIS — G4733 Obstructive sleep apnea (adult) (pediatric): Secondary | ICD-10-CM | POA: Diagnosis not present

## 2016-02-25 DIAGNOSIS — Z91041 Radiographic dye allergy status: Secondary | ICD-10-CM | POA: Diagnosis not present

## 2016-02-25 DIAGNOSIS — M4800 Spinal stenosis, site unspecified: Secondary | ICD-10-CM | POA: Insufficient documentation

## 2016-02-25 DIAGNOSIS — E538 Deficiency of other specified B group vitamins: Secondary | ICD-10-CM | POA: Insufficient documentation

## 2016-02-25 DIAGNOSIS — K219 Gastro-esophageal reflux disease without esophagitis: Secondary | ICD-10-CM | POA: Insufficient documentation

## 2016-02-25 DIAGNOSIS — E1122 Type 2 diabetes mellitus with diabetic chronic kidney disease: Secondary | ICD-10-CM | POA: Diagnosis not present

## 2016-02-25 DIAGNOSIS — Z951 Presence of aortocoronary bypass graft: Secondary | ICD-10-CM | POA: Diagnosis not present

## 2016-02-25 DIAGNOSIS — E785 Hyperlipidemia, unspecified: Secondary | ICD-10-CM | POA: Insufficient documentation

## 2016-02-25 DIAGNOSIS — Z452 Encounter for adjustment and management of vascular access device: Secondary | ICD-10-CM | POA: Diagnosis present

## 2016-02-25 DIAGNOSIS — Z87891 Personal history of nicotine dependence: Secondary | ICD-10-CM | POA: Insufficient documentation

## 2016-02-25 DIAGNOSIS — Z87442 Personal history of urinary calculi: Secondary | ICD-10-CM | POA: Diagnosis not present

## 2016-02-25 HISTORY — PX: PERIPHERAL VASCULAR CATHETERIZATION: SHX172C

## 2016-02-25 SURGERY — DIALYSIS/PERMA CATHETER REMOVAL
Anesthesia: Moderate Sedation | Laterality: Left

## 2016-02-25 NOTE — H&P (Signed)
  [facility] History & Physical Update  The patient was interviewed and re-examined.  The patient's previous History and Physical has been reviewed and is unchanged.  There is no change in the plan of care. We plan to proceed with the scheduled procedure.  Muadh Creasy, MD  02/25/2016, 11:24 AM

## 2016-02-25 NOTE — Op Note (Signed)
Operative Note     Preoperative diagnosis:   1. ESRD with functional permanent access  Postoperative diagnosis:  1. ESRD with functional permanent access  Procedure:  Removal of right jugular Permcath  Surgeon:  Festus BarrenJason Dew, MD  Anesthesia:  Local  EBL:  Minimal  Indication for the Procedure:  The patient has a functional permanent dialysis access and no longer needs their permcath.  This can be removed.  Risks and benefits are discussed and informed consent is obtained.  Description of the Procedure:  The patient's right neck, chest and existing catheter were sterilely prepped and draped. The area around the catheter was anesthetized copiously with 1% lidocaine. The catheter was dissected out with curved hemostats until the cuff was freed from the surrounding fibrous sheath. The fiber sheath was transected, and the catheter was then removed in its entirety using gentle traction. Pressure was held and sterile dressings were placed. The patient tolerated the procedure well and was taken to the recovery room in stable condition.     DEW,JASON  02/25/2016, 12:17 PM

## 2016-02-25 NOTE — H&P (Signed)
[facility] Admission History & Physical  MRN : [id number]  Kenneth [name] is a 82 y.o. ([birth year]) male who presents with chief complaint of No chief complaint on file. Marland Kitchen  History of Present Illness: Patient is an 82 year old male who is here for removal of his PermCath. About 3 months ago, he had a fistula placement which is now his permanent dialysis access. This is working well and he has no complaints. He denies fevers or chills or any signs of systemic infection.  No current facility-administered medications for this encounter.    Past Medical History  Diagnosis Date  . Diabetes mellitus   . Hyperlipidemia   . Hypertension   . Coronary artery disease     a. 2vCABG LIMA-LAD, SVG-OM1 in 1999; b. Lexiscan myoview (5/10) showed EF 45%, HK apex & lateral wall, fixed perfusion defect involving apex, inferoapex, & inferolateral wall, also some inferoapical ischemia; LHC (5/10) showed patent LIMA-LAD and totally occluded SVG-OM1, 99% pLAD, 40% ostial CFX, 40% mCFX, 30% OM1, 20% pRCA  . History of MRI of chest 11/10    a. Cardiac; EF 48%, mid anterolateral and inferolateral walls, apical lateral wall, apical septal wall, and true apex were all think and akinetic; 51-75% thickness subendocardial delayed enhancement in mid anterolateral and inferolateral walls, full thickness enhancement in apical laterla and apical septal walls and true apex  . Nephrolithiasis   . Spinal stenosis   . B12 deficiency   . Obesity   . Ischemic cardiomyopathy     a. likely ICM, EF 55-60%, RWMA could not be excluded, nl PASP, nl CVP  . ESRD on hemodialysis (HCC)     a. once weekly as of 08/2015  . OSA (obstructive sleep apnea)     Moderate CPAP  . Shortness of breath dyspnea   . Anxiety   . GERD (gastroesophageal reflux disease)   . Arthritis   . Anemia   . Occasional tremors     Past Surgical History  Procedure Laterality Date  . Back surgery      Multiple, with spinal  stenosis  . Cataract extraction    . Coronary artery bypass graft  1999    In [place] with LIMA-LAD and SVG-OM1  . Total knee arthroplasty  5/10  . Peripheral vascular catheterization N/A 09/01/2015    Procedure: Dialysis/Perma Catheter Insertion;  Surgeon: Renford Dills, MD;  Location: [facility] INVASIVE CV LAB;  Service: Cardiovascular;  Laterality: N/A;  . Eye surgery Bilateral     Cataract extraction with IOL  . Cardiac catheterization    . Joint replacement Left     Total Knee Replacement  . Insertion of dialysis catheter Right     Dr. Wyn Quaker, [facility]  . Bascilic vein transposition Left 12/11/2015    Procedure: BASCILIC VEIN TRANSPOSITION ( STAGE 1 );  Surgeon: Renford Dills, MD;  Location: [facility] ORS;  Service: Vascular;  Laterality: Left;    Social History Social History  Substance Use Topics  . Smoking status: Former Smoker    Types: Cigarettes  . Smokeless tobacco: Never Used  . Alcohol Use: No  No IV drug use  Family History Family History  Problem Relation Age of Onset  . Kidney failure Mother   . Heart failure Father   no bleeding or clotting disorders  Allergies  Allergen Reactions  . Beef-Derived Products Hives  . Iodine Hives  . Ivp Dye [Iodinated Diagnostic Agents] Hives     REVIEW OF SYSTEMS (Negative unless checked)  Constitutional: [] Weight loss  [] Fever  [] Chills Cardiac: [] Chest pain   [] Chest pressure   [x] Palpitations   [] Shortness of breath when laying flat   [] Shortness of breath at rest   [] Shortness of breath with exertion. Vascular:  [] Pain in legs with walking   [] Pain in legs at rest   [] Pain in legs when laying flat   [] Claudication   [] Pain in feet when walking  [] Pain in feet at rest  [] Pain in feet when laying flat   [] History of DVT   [] Phlebitis   [] Swelling in legs   [] Varicose veins   [] Non-healing ulcers Pulmonary:   [] Uses home oxygen   [] Productive cough   [] Hemoptysis   [] Wheeze  [] COPD   [] Asthma Neurologic:  [] Dizziness   [] Blackouts   [] Seizures   [] History of stroke   [] History of TIA  [] Aphasia   [] Temporary blindness   [] Dysphagia   [] Weakness or numbness in arms   [] Weakness or numbness in legs Musculoskeletal:  [] Arthritis   [] Joint swelling   [] Joint pain   [] Low back pain Hematologic:  [] Easy bruising  [] Easy bleeding   [] Hypercoagulable state   [] Anemic  [] Hepatitis Gastrointestinal:  [] Blood in stool   [] Vomiting blood  [] Gastroesophageal reflux/heartburn   [] Difficulty swallowing. Genitourinary:  [x] Chronic kidney disease   [] Difficult urination  [] Frequent urination  [] Burning with urination   [] Blood in urine Skin:  [] Rashes   [] Ulcers   [] Wounds Psychological:  [] History of anxiety   []  History of major depression.  Physical Examination  Filed Vitals:   02/25/16 1050  BP: 124/59  Pulse: 75  Resp: 22  SpO2: 93%   There is no weight on file to calculate BMI. Gen: WD/WN, NAD Head: [place]/AT, No temporalis wasting. Prominent temp pulse not noted. Ear/Nose/Throat: Hearing grossly intact, nares w/o erythema or drainage, oropharynx w/o Erythema/Exudate,  Eyes: PERRLA, EOMI.  Neck: Supple, no nuchal rigidity.  No JVD.  Pulmonary:  Good air movement, no use of accessory muscles.  Cardiac: RRR, normal S1, S2 Vascular: thrill and bruit present in left arm AVF Vessel Right Left  Radial Palpable Palpable                                   Gastrointestinal: soft, non-tender/non-distended. No guarding/reflex.  Musculoskeletal: M/S 5/5 throughout.  Extremities without ischemic changes.  No deformity or atrophy.  Neurologic: CN 2-12 intact. Pain and light touch intact in extremities.  Symmetrical.  Psychiatric: Judgment intact, Mood & affect appropriate for pt's clinical situation. Dermatologic: No rashes or ulcers noted.  No cellulitis or open wounds. Lymph : No Cervical, Axillary, or Inguinal lymphadenopathy.      CBC Lab Results  Component Value Date   WBC 4.9 12/07/2015   HGB 12.9*  12/11/2015   HCT 38.0* 12/11/2015   MCV 93.1 12/07/2015   PLT 246 12/07/2015    BMET    Component Value Date/Time   NA 137 12/11/2015 1054   NA 134* 02/22/2014 1410   K 5.7* 12/11/2015 1054   K 5.0 02/22/2014 1805   CL 99* 12/07/2015 1628   CL 105 02/22/2014 1410   CO2 25 12/07/2015 1628   CO2 23 02/22/2014 1410   GLUCOSE 125* 12/11/2015 1054   GLUCOSE 159* 02/22/2014 1410   BUN 29* 12/07/2015 1628   BUN 31* 02/22/2014 1410   CREATININE 4.02* 12/07/2015 1628   CREATININE 3.02* 02/22/2014 1410   CALCIUM 7.9* 12/07/2015 1628  CALCIUM 8.3* 02/22/2014 1410   GFRNONAA 12* 12/07/2015 1628   GFRNONAA 18* 02/22/2014 1410   GFRAA 14* 12/07/2015 1628   GFRAA 21* 02/22/2014 1410   CrCl cannot be calculated (Unknown ideal weight.).  COAG Lab Results  Component Value Date   INR 1.13 11/10/2015   INR 1.17 08/24/2015    Radiology No results found.    Assessment/Plan 1. ESRD, now with functional access.  Can remove permcath and will do so today. Risks and benefits discussed. 2. Hypertension. Stable. On outpatient medications. 3. Diabetes. Stable. On outpatient medications.   DEW,JASON, MD  02/25/2016 11:28 AM

## 2016-02-25 NOTE — Discharge Instructions (Signed)

## 2016-02-26 ENCOUNTER — Encounter: Payer: Self-pay | Admitting: Vascular Surgery

## 2016-03-16 ENCOUNTER — Other Ambulatory Visit: Payer: Self-pay | Admitting: Vascular Surgery

## 2016-03-22 ENCOUNTER — Encounter
Admission: RE | Disposition: A | Discharge status: Home / Self Care | Payer: Self-pay | Source: Ambulatory Visit | Attending: Vascular Surgery

## 2016-03-22 ENCOUNTER — Encounter: Payer: Self-pay | Admitting: *Deleted

## 2016-03-22 ENCOUNTER — Ambulatory Visit
Admission: RE | Admit: 2016-03-22 | Discharge: 2016-03-22 | Disposition: A | Discharge status: Home / Self Care | Payer: Medicare Other | Source: Ambulatory Visit | Attending: Vascular Surgery | Admitting: Vascular Surgery

## 2016-03-22 DIAGNOSIS — Z91041 Radiographic dye allergy status: Secondary | ICD-10-CM | POA: Insufficient documentation

## 2016-03-22 DIAGNOSIS — Y832 Surgical operation with anastomosis, bypass or graft as the cause of abnormal reaction of the patient, or of later complication, without mention of misadventure at the time of the procedure: Secondary | ICD-10-CM | POA: Diagnosis not present

## 2016-03-22 DIAGNOSIS — Z87891 Personal history of nicotine dependence: Secondary | ICD-10-CM | POA: Insufficient documentation

## 2016-03-22 DIAGNOSIS — M1990 Unspecified osteoarthritis, unspecified site: Secondary | ICD-10-CM | POA: Insufficient documentation

## 2016-03-22 DIAGNOSIS — E1122 Type 2 diabetes mellitus with diabetic chronic kidney disease: Secondary | ICD-10-CM | POA: Diagnosis not present

## 2016-03-22 DIAGNOSIS — Z7982 Long term (current) use of aspirin: Secondary | ICD-10-CM | POA: Insufficient documentation

## 2016-03-22 DIAGNOSIS — Z96659 Presence of unspecified artificial knee joint: Secondary | ICD-10-CM | POA: Diagnosis not present

## 2016-03-22 DIAGNOSIS — M79609 Pain in unspecified limb: Secondary | ICD-10-CM | POA: Diagnosis not present

## 2016-03-22 DIAGNOSIS — N186 End stage renal disease: Secondary | ICD-10-CM | POA: Insufficient documentation

## 2016-03-22 DIAGNOSIS — M47812 Spondylosis without myelopathy or radiculopathy, cervical region: Secondary | ICD-10-CM | POA: Diagnosis not present

## 2016-03-22 DIAGNOSIS — M5136 Other intervertebral disc degeneration, lumbar region: Secondary | ICD-10-CM | POA: Insufficient documentation

## 2016-03-22 DIAGNOSIS — Z992 Dependence on renal dialysis: Secondary | ICD-10-CM | POA: Insufficient documentation

## 2016-03-22 DIAGNOSIS — M5030 Other cervical disc degeneration, unspecified cervical region: Secondary | ICD-10-CM | POA: Diagnosis not present

## 2016-03-22 DIAGNOSIS — T82898A Other specified complication of vascular prosthetic devices, implants and grafts, initial encounter: Secondary | ICD-10-CM | POA: Diagnosis present

## 2016-03-22 HISTORY — PX: PERIPHERAL VASCULAR CATHETERIZATION: SHX172C

## 2016-03-22 LAB — POTASSIUM (ARMC VASCULAR LAB ONLY): Potassium (ARMC vascular lab): 5.4 — ABNORMAL HIGH (ref 3.5–5.1)

## 2016-03-22 SURGERY — A/V SHUNTOGRAM/FISTULAGRAM
Anesthesia: Moderate Sedation | Laterality: Left

## 2016-03-22 MED ORDER — ACETAMINOPHEN 325 MG RE SUPP
325.0000 mg | RECTAL | Status: DC | PRN
  Filled 2016-03-22: qty 2

## 2016-03-22 MED ORDER — FAMOTIDINE 20 MG PO TABS
ORAL_TABLET | ORAL | Status: AC
  Administered 2016-03-22: 20 mg
  Filled 2016-03-22: qty 1

## 2016-03-22 MED ORDER — DOCUSATE SODIUM 100 MG PO CAPS: 100.0000 mg | ORAL_CAPSULE | Freq: Every day | ORAL | Status: DC

## 2016-03-22 MED ORDER — FENTANYL CITRATE (PF) 100 MCG/2ML IJ SOLN
INTRAMUSCULAR | Status: AC
  Filled 2016-03-22: qty 2

## 2016-03-22 MED ORDER — METHYLPREDNISOLONE SODIUM SUCC 125 MG IJ SOLR
INTRAMUSCULAR | Status: AC
  Administered 2016-03-22: 125 mg
  Filled 2016-03-22: qty 2

## 2016-03-22 MED ORDER — ALUM & MAG HYDROXIDE-SIMETH 200-200-20 MG/5ML PO SUSP: 15.0000 mL | ORAL | Status: DC | PRN

## 2016-03-22 MED ORDER — PANTOPRAZOLE SODIUM 40 MG PO TBEC: 40.0000 mg | DELAYED_RELEASE_TABLET | Freq: Every day | ORAL | Status: DC

## 2016-03-22 MED ORDER — ONDANSETRON HCL 4 MG/2ML IJ SOLN: 4.0000 mg | Freq: Four times a day (QID) | INTRAMUSCULAR | Status: DC | PRN

## 2016-03-22 MED ORDER — LIDOCAINE HCL (PF) 1 % IJ SOLN
INTRAMUSCULAR | Status: AC
  Filled 2016-03-22: qty 30

## 2016-03-22 MED ORDER — HYDROMORPHONE HCL 1 MG/ML IJ SOLN: 1.0000 mg | Freq: Once | INTRAMUSCULAR | Status: DC

## 2016-03-22 MED ORDER — METHYLPREDNISOLONE SODIUM SUCC 125 MG IJ SOLR: 125.0000 mg | Freq: Once | INTRAMUSCULAR | Status: DC

## 2016-03-22 MED ORDER — OXYCODONE HCL 5 MG PO TABS: 5.0000 mg | ORAL_TABLET | ORAL | Status: DC | PRN

## 2016-03-22 MED ORDER — LABETALOL HCL 5 MG/ML IV SOLN: 10.0000 mg | INTRAVENOUS | Status: DC | PRN

## 2016-03-22 MED ORDER — HYDRALAZINE HCL 20 MG/ML IJ SOLN: 5.0000 mg | INTRAMUSCULAR | Status: DC | PRN

## 2016-03-22 MED ORDER — FENTANYL CITRATE (PF) 100 MCG/2ML IJ SOLN
INTRAMUSCULAR | Status: DC | PRN
  Administered 2016-03-22: 25 ug via INTRAVENOUS
  Administered 2016-03-22: 50 ug via INTRAVENOUS

## 2016-03-22 MED ORDER — ACETAMINOPHEN 325 MG PO TABS: 325.0000 mg | ORAL_TABLET | ORAL | Status: DC | PRN

## 2016-03-22 MED ORDER — FAMOTIDINE 20 MG PO TABS: 40.0000 mg | ORAL_TABLET | ORAL | Status: DC | PRN

## 2016-03-22 MED ORDER — METHYLPREDNISOLONE SODIUM SUCC 125 MG IJ SOLR: 125.0000 mg | INTRAMUSCULAR | Status: DC | PRN

## 2016-03-22 MED ORDER — SODIUM CHLORIDE 0.9 % IV SOLN
INTRAVENOUS | Status: DC
  Administered 2016-03-22: 14:00:00 via INTRAVENOUS

## 2016-03-22 MED ORDER — MORPHINE SULFATE (PF) 4 MG/ML IV SOLN: 2.0000 mg | INTRAVENOUS | Status: DC | PRN

## 2016-03-22 MED ORDER — MIDAZOLAM HCL 5 MG/5ML IJ SOLN
INTRAMUSCULAR | Status: AC
  Filled 2016-03-22: qty 5

## 2016-03-22 MED ORDER — FAMOTIDINE 20 MG PO TABS: 20.0000 mg | ORAL_TABLET | Freq: Once | ORAL | Status: DC

## 2016-03-22 MED ORDER — HEPARIN (PORCINE) IN NACL 2-0.9 UNIT/ML-% IJ SOLN
INTRAMUSCULAR | Status: AC
  Filled 2016-03-22: qty 1000

## 2016-03-22 MED ORDER — MIDAZOLAM HCL 2 MG/2ML IJ SOLN
INTRAMUSCULAR | Status: DC | PRN
  Administered 2016-03-22: 0.5 mg via INTRAVENOUS
  Administered 2016-03-22: 2 mg via INTRAVENOUS

## 2016-03-22 MED ORDER — LIDOCAINE HCL (PF) 1 % IJ SOLN
INTRAMUSCULAR | Status: DC | PRN
  Administered 2016-03-22: 2 mL via INTRADERMAL

## 2016-03-22 MED ORDER — METOPROLOL TARTRATE 5 MG/5ML IV SOLN: 5.0000 mg | Freq: Four times a day (QID) | INTRAVENOUS | Status: DC

## 2016-03-22 SURGICAL SUPPLY — 5 items
DRAPE BRACHIAL (DRAPES) ×3 IMPLANT
PACK ANGIOGRAPHY (CUSTOM PROCEDURE TRAY) ×3 IMPLANT
SET INTRO CAPELLA COAXIAL (SET/KITS/TRAYS/PACK) ×3 IMPLANT
SHEATH BRITE TIP 6FRX5.5 (SHEATH) ×3 IMPLANT
TOWEL OR 17X26 4PK STRL BLUE (TOWEL DISPOSABLE) ×3 IMPLANT

## 2016-03-22 NOTE — H&P (Signed)
[facility] History & Physical Update  The patient was interviewed and re-examined.  The patient's previous History and Physical has been reviewed and is unchanged.  There is no change in the plan of care. We plan to proceed with the scheduled procedure.  Schnier, Latina Craver, MD  03/22/2016, 1:04 PM

## 2016-03-22 NOTE — Discharge Instructions (Signed)

## 2016-03-22 NOTE — Op Note (Signed)
OPERATIVE NOTE   PROCEDURE: 1. Contrast injection left arm basilic transposition  PRE-OPERATIVE DIAGNOSIS: Complication of dialysis access                                                       End Stage Renal Disease  POST-OPERATIVE DIAGNOSIS: same as above   SURGEON: Renford Dills, M.D.  ANESTHESIA: Conscious sedation was administered under my direct supervision. IV Versed plus fentanyl were utilized. Continuous ECG, pulse oximetry and blood pressure was monitored throughout the entire procedure.  Conscious sedation was for a total of 30 minutes.  ESTIMATED BLOOD LOSS: minimal  FINDING(S): 1. Normal-appearing basilic transposition  SPECIMEN(S):  None  CONTRAST: 20 cc  FLUOROSCOPY TIME: 0.3 minutes  INDICATIONS: Kenneth [name] is a 82 y.o. male who  presents with malfunctioning left arm basilic transposition AV access.  The patient is scheduled for angiography with possible intervention of the AV access.  The patient is aware the risks include but are not limited to: bleeding, infection, thrombosis of the cannulated access, and possible anaphylactic reaction to the contrast.  The patient acknowledges if the access can not be salvaged a tunneled catheter will be needed and will be placed during this procedure.  The patient is aware of the risks of the procedure and elects to proceed with the angiogram and intervention.  DESCRIPTION: After full informed written consent was obtained, the patient was brought back to the Special Procedure suite and placed supine position.  Appropriate cardiopulmonary monitors were placed.  The left arm was prepped and draped in the standard fashion.  Appropriate timeout is called. The left basilic transposition  was cannulated with a micropuncture needle under ultrasound guidance. Ultrasound was placed in a sterile sleeve. The fistula was identified it was echolucent and compressible indicating patency. Images recorded for the permanent record.  Puncture was made under direct ultrasound guidance..  The microwire was advanced and the needle was exchanged for  a microsheath.  The J-wire was then advanced and a 6 Fr sheath inserted.  Hand injections were completed to image the access from the arterial anastomosis through the entire access.  The central venous structures were also imaged by hand injections.  Based on the images, basilic transposition appears to be patent without evidence of a stricture or stenosis that would limit the overall flow. Central veins are all widely patent. Arterial anastomosis is patent and the portions of the brachial artery that are visualized are widely patent as well.    A 4-0 Monocryl purse-string suture was sewn around the sheath.  The sheath was removed and light pressure was applied.  A sterile bandage was applied to the puncture site.    COMPLICATIONS: None  CONDITION: [name], M.D [facility] Office: [phone]  03/22/2016 3:30 PM

## 2016-03-23 ENCOUNTER — Encounter: Payer: Self-pay | Admitting: Vascular Surgery

## 2016-05-16 ENCOUNTER — Other Ambulatory Visit: Payer: Self-pay | Admitting: Nephrology

## 2016-05-16 ENCOUNTER — Ambulatory Visit
Admission: RE | Admit: 2016-05-16 | Discharge: 2016-05-16 | Disposition: A | Discharge status: Home / Self Care | Payer: Medicare Other | Source: Ambulatory Visit | Attending: Interventional Radiology | Admitting: Interventional Radiology

## 2016-05-16 ENCOUNTER — Ambulatory Visit
Admission: RE | Admit: 2016-05-16 | Discharge: 2016-05-16 | Disposition: A | Discharge status: Home / Self Care | Payer: Medicare Other | Source: Ambulatory Visit | Attending: Nephrology | Admitting: Nephrology

## 2016-05-16 DIAGNOSIS — J189 Pneumonia, unspecified organism: Secondary | ICD-10-CM

## 2016-05-16 DIAGNOSIS — Z0000 Encounter for general adult medical examination without abnormal findings: Secondary | ICD-10-CM

## 2016-05-16 DIAGNOSIS — J918 Pleural effusion in other conditions classified elsewhere: Secondary | ICD-10-CM

## 2016-05-16 DIAGNOSIS — J984 Other disorders of lung: Secondary | ICD-10-CM | POA: Diagnosis present

## 2016-05-16 DIAGNOSIS — Z9889 Other specified postprocedural states: Secondary | ICD-10-CM | POA: Insufficient documentation

## 2016-05-16 DIAGNOSIS — R0602 Shortness of breath: Secondary | ICD-10-CM

## 2016-05-16 DIAGNOSIS — J90 Pleural effusion, not elsewhere classified: Secondary | ICD-10-CM | POA: Diagnosis not present

## 2016-05-16 NOTE — Procedures (Signed)
Interventional Radiology Procedure Note  Procedure: US guided right thoracentesis.  1.45L of thin amber fluid removed.  No complexity.  No blood.  Labs deferred.   Complications: None Recommendations:  - Ok to DC. - CXR pending.  - Do not submerge for 7 days - Routine care - plan for left thora 9/26   Signed,  Yvone NeuJaime S. Loreta AveWagner, DO

## 2016-05-17 ENCOUNTER — Ambulatory Visit
Admission: RE | Admit: 2016-05-17 | Discharge: 2016-05-17 | Disposition: A | Discharge status: Home / Self Care | Payer: Medicare Other | Source: Ambulatory Visit | Attending: Diagnostic Radiology | Admitting: Diagnostic Radiology

## 2016-05-17 ENCOUNTER — Telehealth: Payer: Self-pay | Admitting: Cardiothoracic Surgery

## 2016-05-17 ENCOUNTER — Ambulatory Visit
Admission: RE | Admit: 2016-05-17 | Discharge: 2016-05-17 | Disposition: A | Discharge status: Home / Self Care | Payer: Medicare Other | Source: Ambulatory Visit | Attending: Nephrology | Admitting: Nephrology

## 2016-05-17 DIAGNOSIS — J90 Pleural effusion, not elsewhere classified: Secondary | ICD-10-CM | POA: Diagnosis not present

## 2016-05-17 DIAGNOSIS — J948 Other specified pleural conditions: Secondary | ICD-10-CM | POA: Diagnosis present

## 2016-05-17 DIAGNOSIS — Z9889 Other specified postprocedural states: Secondary | ICD-10-CM | POA: Insufficient documentation

## 2016-05-17 DIAGNOSIS — J918 Pleural effusion in other conditions classified elsewhere: Secondary | ICD-10-CM

## 2016-05-17 DIAGNOSIS — J189 Pneumonia, unspecified organism: Secondary | ICD-10-CM

## 2016-05-17 LAB — GRAM STAIN

## 2016-05-17 LAB — BODY FLUID CELL COUNT WITH DIFFERENTIAL
Eos, Fluid: 1 %
LYMPHS FL: 80 %
MONOCYTE-MACROPHAGE-SEROUS FLUID: 15 %
Neutrophil Count, Fluid: 4 %
Other Cells, Fluid: 0 %
WBC FLUID: 2881 uL

## 2016-05-17 NOTE — Telephone Encounter (Signed)
Left voice message with patient to call and schedule appointment with Dr. Thelma Bargeaks for bilateral pleural effusion. [facility]

## 2016-05-17 NOTE — Progress Notes (Signed)
Post left thoracentesis-see MD note-Patient c/o pain and FIO2 sats. Dropped.  O2 was increased to 4L/min. Via n/c.  BP remained stable. Skin warm/dry/pink.  FIO2 stabilized and O2 decreased back to 2L/min.  Fluid removed was very bloody. Dr. Wynelle LinkKolluru called-labs ordered per Dr. Alice RiegerKolluru's request by Dr. Chilton SiGreen.  Per Dr. Shellia CleverlyKolluru-pt. To be discharged home if VS's and pain level stable.  Patient encouraged to call 911 and come to ED if breathing gets worse or other complications.  Chest X-ray reviewed by Dr. Chilton SiGreen and pt. Cleared for discharge home.

## 2016-05-17 NOTE — Procedures (Signed)
Under US guidance, left thoracentesis was performed. CXR to follow.

## 2016-05-19 LAB — CYTOLOGY - NON PAP

## 2016-05-20 ENCOUNTER — Encounter: Payer: Self-pay | Admitting: Cardiothoracic Surgery

## 2016-05-20 ENCOUNTER — Ambulatory Visit (INDEPENDENT_AMBULATORY_CARE_PROVIDER_SITE_OTHER): Payer: Medicare Other | Admitting: Cardiothoracic Surgery

## 2016-05-20 VITALS — BP 111/76 | HR 92 | Temp 98.2°F | Ht 64.0 in | Wt 169.1 lb

## 2016-05-20 DIAGNOSIS — J90 Pleural effusion, not elsewhere classified: Secondary | ICD-10-CM

## 2016-05-20 DIAGNOSIS — D649 Anemia, unspecified: Secondary | ICD-10-CM | POA: Insufficient documentation

## 2016-05-20 NOTE — Progress Notes (Signed)
Patient ID: Kenneth [name], male   DOB: 1928-05-30, 82 y.o.   MRN: [id number]  Chief Complaint  Patient presents with  . New Patient (Initial Visit)    Bilateral Pleural Effusion    Referred By Dr. Eustace PenHaun D Reason for Referral bilateral pleural effusions  HPI Location, Quality, Duration, Severity, Timing, Context, Modifying Factors, Associated Signs and Symptoms.  Kenneth [name] is a 82 y.o. male.  This is an 82 year old gentleman with a history of diabetes and atherosclerotic vascular disease who fell earlier this year and workup confirmed bilateral pleural effusions. In addition he was found to have significant renal disease and had a vascular catheter placed for dialysis and has been on dialysis since early spring of this year. More recently he's developed bilateral pleural effusions and underwent both right and left-sided pleural drainage procedures. They drained approximately 1.5 L on both sides. The left side was described as being somewhat bloody and they could not take all that off because of significant pain. He comes in today stating that he felt better after his thoracentesis. However he remains relatively sedentary in his activities of daily living and would like to discuss permanent drainage of his pleural space with bilateral Pleurx catheters.  He does have a history of coronary bypass surgery about 15 years ago. He's had no complications following that. He states he did have one episode of pneumonia earlier this year and was treated appropriately. He's also been on oxygen therapy continuously for the last several weeks. He receives dialysis by way of a functioning left upper extremity fistula 3 times per week.   Past Medical History:  Diagnosis Date  . Anemia   . Anxiety   . Arthritis   . B12 deficiency   . Coronary artery disease    a. 2vCABG LIMA-LAD, SVG-OM1 in 1999; b. Lexiscan myoview (5/10) showed EF 45%, HK apex & lateral wall, fixed perfusion defect involving apex,  inferoapex, & inferolateral wall, also some inferoapical ischemia; LHC (5/10) showed patent LIMA-LAD and totally occluded SVG-OM1, 99% pLAD, 40% ostial CFX, 40% mCFX, 30% OM1, 20% pRCA  . Diabetes mellitus   . ESRD on hemodialysis (HCC)    a. once weekly as of 08/2015  . GERD (gastroesophageal reflux disease)   . History of MRI of chest 11/10   a. Cardiac; EF 48%, mid anterolateral and inferolateral walls, apical lateral wall, apical septal wall, and true apex were all think and akinetic; 51-75% thickness subendocardial delayed enhancement in mid anterolateral and inferolateral walls, full thickness enhancement in apical laterla and apical septal walls and true apex  . Hyperlipidemia   . Hypertension   . Ischemic cardiomyopathy    a. likely ICM, EF 55-60%, RWMA could not be excluded, nl PASP, nl CVP  . Nephrolithiasis   . Obesity   . Occasional tremors   . OSA (obstructive sleep apnea)    Moderate CPAP  . Shortness of breath dyspnea   . Spinal stenosis     Past Surgical History:  Procedure Laterality Date  . BACK SURGERY     Multiple, with spinal stenosis  . BASCILIC VEIN TRANSPOSITION Left 12/11/2015   Procedure: BASCILIC VEIN TRANSPOSITION ( STAGE 1 );  Surgeon: Renford DillsGregory G Schnier, MD;  Location: [facility] ORS;  Service: Vascular;  Laterality: Left;  . CARDIAC CATHETERIZATION    . CATARACT EXTRACTION    . CORONARY ARTERY BYPASS GRAFT  1999   In [place] with LIMA-LAD and SVG-OM1  . EYE SURGERY Bilateral    Cataract  extraction with IOL  . INSERTION OF DIALYSIS CATHETER Right    Dr. Wyn Quaker, [facility]  . JOINT REPLACEMENT Left    Total Knee Replacement  . PERIPHERAL VASCULAR CATHETERIZATION N/A 09/01/2015   Procedure: Dialysis/Perma Catheter Insertion;  Surgeon: Renford Dills, MD;  Location: [facility] INVASIVE CV LAB;  Service: Cardiovascular;  Laterality: N/A;  . PERIPHERAL VASCULAR CATHETERIZATION Left 02/25/2016   Procedure: Dialysis/Perma Catheter Removal;  Surgeon: Annice Needy, MD;   Location: [facility] INVASIVE CV LAB;  Service: Cardiovascular;  Laterality: Left;  . PERIPHERAL VASCULAR CATHETERIZATION Left 03/22/2016   Procedure: A/V Shuntogram/Fistulagram;  Surgeon: Renford Dills, MD;  Location: [facility] INVASIVE CV LAB;  Service: Cardiovascular;  Laterality: Left;  . TOTAL KNEE ARTHROPLASTY  5/10    Family History  Problem Relation Age of Onset  . Kidney failure Mother   . Heart failure Father     Social History Social History  Substance Use Topics  . Smoking status: Former Smoker    Types: Cigarettes  . Smokeless tobacco: Never Used  . Alcohol use No    Allergies  Allergen Reactions  . Beef-Derived Products Hives  . Iodine Hives  . Ivp Dye [Iodinated Diagnostic Agents] Hives    Current Outpatient Prescriptions  Medication Sig Dispense Refill  . albuterol (PROVENTIL HFA;VENTOLIN HFA) 108 (90 Base) MCG/ACT inhaler Inhale 2 puffs into the lungs every 6 (six) hours as needed for wheezing or shortness of breath. 1 Inhaler 6  . aspirin EC 81 MG tablet Take 81 mg by mouth daily.    . cinacalcet (SENSIPAR) 30 MG tablet Take 1 tablet by mouth daily.    . ferrous sulfate 325 (65 FE) MG tablet Take 325 mg by mouth daily with breakfast.     . Insulin Detemir (LEVEMIR FLEXTOUCH) 100 UNIT/ML Pen Inject 22 Units into the skin daily before breakfast.    . losartan (COZAAR) 50 MG tablet Take 50 mg by mouth daily.    Marland Kitchen lovastatin (MEVACOR) 20 MG tablet Take 20 mg by mouth at bedtime.    . Melatonin 5 MG TABS Take 1 tablet by mouth at bedtime.    . metoprolol succinate (TOPROL-XL) 50 MG 24 hr tablet Take 50 mg by mouth daily. Take with or immediately following a meal.    . sertraline (ZOLOFT) 25 MG tablet Take 25 mg by mouth daily.    . tamsulosin (FLOMAX) 0.4 MG CAPS capsule Take 1 capsule (0.4 mg total) by mouth daily. 30 capsule 0   No current facility-administered medications for this visit.       Review of Systems A complete review of systems was asked and was  negative except for the following positive findingsShortness of breath, swelling of his legs, wheezing, joint pain, headache, depression, excessive thirst, easy bruising.  Blood pressure 111/76, pulse 92, temperature 98.2 F (36.8 C), temperature source Oral, height 5\' 4"  (1.626 m), weight 169 lb 1.6 oz (76.7 kg).  Physical Exam CONSTITUTIONAL:  Pleasant, well-developed, well-nourished, and in no acute distress. EYES: Pupils equal and reactive to light, Sclera non-icteric EARS, NOSE, MOUTH AND THROAT:  The oropharynx was clear.  Dentition is good repair.  Oral mucosa pink and moist. LYMPH NODES:  Lymph nodes in the neck and axillae were normal RESPIRATORY:  Lungs were clear.  Normal respiratory effort without pathologic use of accessory muscles of respiration CARDIOVASCULAR: Heart was regular without murmurs.  There were no carotid bruits. GI: The abdomen was soft, nontender, and nondistended. There were no palpable masses. There  was no hepatosplenomegaly. There were normal bowel sounds in all quadrants. GU:  Rectal deferred.   MUSCULOSKELETAL:  Normal muscle strength and tone.  No clubbing or cyanosis.   SKIN:  There were no pathologic skin lesions.  There were no nodules on palpation. NEUROLOGIC:  Sensation is normal.  Cranial nerves are grossly intact. PSYCH:  Oriented to person, place and time.  Mood and affect are normal.  Data Reviewed Multiple chest x-rays and CT scans  I have personally reviewed the patient's imaging, laboratory findings and medical records.    Assessment    Bilateral pleural effusions.    Plan    I had a chance to review with the patient the indications and risks of bilateral Pleurx catheter insertions. He understands that these catheters are palliative only and that they require considerable patient input for management. He also understands that an aggressive dialysis schedule with compliance of his diet may prevent the need for placement of these catheters.  He would like to think about his options and I gave him my business card. I explained to him the indications and risks as well as the long-term management of Pleurx catheters. I told him I would recommend we do one side of the time in stage the procedures if he desires to proceed on. He and his wife would like to think about their options and will contact me if they wish to proceed.       Hulda Marin, MD 05/20/2016, 2:30 PM

## 2016-05-22 LAB — CULTURE, BODY FLUID W GRAM STAIN -BOTTLE: Culture: NO GROWTH

## 2016-06-20 ENCOUNTER — Emergency Department
Admission: EM | Admit: 2016-06-20 | Discharge: 2016-06-20 | Disposition: A | Discharge status: Home / Self Care | Payer: Medicare Other | Attending: Emergency Medicine | Admitting: Emergency Medicine

## 2016-06-20 ENCOUNTER — Encounter: Payer: Self-pay | Admitting: Emergency Medicine

## 2016-06-20 DIAGNOSIS — Z992 Dependence on renal dialysis: Secondary | ICD-10-CM | POA: Diagnosis not present

## 2016-06-20 DIAGNOSIS — Z79899 Other long term (current) drug therapy: Secondary | ICD-10-CM | POA: Diagnosis not present

## 2016-06-20 DIAGNOSIS — R21 Rash and other nonspecific skin eruption: Secondary | ICD-10-CM

## 2016-06-20 DIAGNOSIS — E1122 Type 2 diabetes mellitus with diabetic chronic kidney disease: Secondary | ICD-10-CM | POA: Insufficient documentation

## 2016-06-20 DIAGNOSIS — F419 Anxiety disorder, unspecified: Secondary | ICD-10-CM | POA: Insufficient documentation

## 2016-06-20 DIAGNOSIS — I132 Hypertensive heart and chronic kidney disease with heart failure and with stage 5 chronic kidney disease, or end stage renal disease: Secondary | ICD-10-CM | POA: Insufficient documentation

## 2016-06-20 DIAGNOSIS — D649 Anemia, unspecified: Secondary | ICD-10-CM | POA: Insufficient documentation

## 2016-06-20 DIAGNOSIS — I2510 Atherosclerotic heart disease of native coronary artery without angina pectoris: Secondary | ICD-10-CM | POA: Insufficient documentation

## 2016-06-20 DIAGNOSIS — D519 Vitamin B12 deficiency anemia, unspecified: Secondary | ICD-10-CM | POA: Diagnosis not present

## 2016-06-20 DIAGNOSIS — Z7982 Long term (current) use of aspirin: Secondary | ICD-10-CM | POA: Diagnosis not present

## 2016-06-20 DIAGNOSIS — E785 Hyperlipidemia, unspecified: Secondary | ICD-10-CM | POA: Diagnosis not present

## 2016-06-20 DIAGNOSIS — N186 End stage renal disease: Secondary | ICD-10-CM | POA: Diagnosis not present

## 2016-06-20 DIAGNOSIS — Z87891 Personal history of nicotine dependence: Secondary | ICD-10-CM | POA: Diagnosis not present

## 2016-06-20 DIAGNOSIS — I5032 Chronic diastolic (congestive) heart failure: Secondary | ICD-10-CM | POA: Insufficient documentation

## 2016-06-20 MED ORDER — HYDROXYZINE HCL 10 MG/5ML PO SYRP: 10.0000 mg | ORAL_SOLUTION | Freq: Three times a day (TID) | ORAL | 0 refills | Status: DC | PRN

## 2016-06-20 MED ORDER — HYDROCORTISONE VALERATE 0.2 % EX OINT: TOPICAL_OINTMENT | CUTANEOUS | 1 refills | Status: AC

## 2016-06-20 NOTE — ED Triage Notes (Signed)
Pt with generalized itching rash since Friday, wife reports pt with fever this weekend and has been taking benadryl with no relief. Pt denies ibuprofen or tylenol use today.

## 2016-06-20 NOTE — ED Notes (Signed)
Per family he developed generalized itching on Friday after dialysis  Also having some flank discomfort  No fever

## 2016-06-20 NOTE — ED Provider Notes (Signed)
[facility] Emergency Department Provider Note   ____________________________________________   First MD Initiated Contact with Patient 06/20/16 1303     (approximate)  I have reviewed the triage vital signs and the nursing notes.   HISTORY  Chief Complaint Rash    HPI Kenneth [name] is a 82 y.o. male patient complaining of generalized itching rash for 3 days. Wife state there was a fever with this rash which is resolved for intervention. Patient state he is taking Benadryl no relief from the itching.Patient denies pain with this complaint. Patient had dialysis today. No other palliative measures for his complaint.   Past Medical History:  Diagnosis Date  . Anemia   . Anxiety   . Arthritis   . B12 deficiency   . Coronary artery disease    a. 2vCABG LIMA-LAD, SVG-OM1 in 1999; b. Lexiscan myoview (5/10) showed EF 45%, HK apex & lateral wall, fixed perfusion defect involving apex, inferoapex, & inferolateral wall, also some inferoapical ischemia; LHC (5/10) showed patent LIMA-LAD and totally occluded SVG-OM1, 99% pLAD, 40% ostial CFX, 40% mCFX, 30% OM1, 20% pRCA  . Diabetes mellitus   . ESRD on hemodialysis (HCC)    a. once weekly as of 08/2015  . GERD (gastroesophageal reflux disease)   . History of MRI of chest 11/10   a. Cardiac; EF 48%, mid anterolateral and inferolateral walls, apical lateral wall, apical septal wall, and true apex were all think and akinetic; 51-75% thickness subendocardial delayed enhancement in mid anterolateral and inferolateral walls, full thickness enhancement in apical laterla and apical septal walls and true apex  . Hyperlipidemia   . Hypertension   . Ischemic cardiomyopathy    a. likely ICM, EF 55-60%, RWMA could not be excluded, nl PASP, nl CVP  . Nephrolithiasis   . Obesity   . Occasional tremors   . OSA (obstructive sleep apnea)    Moderate CPAP  . Shortness of breath dyspnea   . Spinal stenosis     Patient  Active Problem List   Diagnosis Date Noted  . Anemia, unspecified 05/20/2016  . ESRD on dialysis (HCC) 09/28/2015  . Ischemic cardiomyopathy   . CKD (chronic kidney disease) stage 4, GFR 15-29 ml/min (HCC) 08/26/2015  . Sinus tachycardia 08/26/2015  . Demand ischemia (HCC)   . Coronary artery disease involving coronary bypass graft of native heart without angina pectoris   . Ischemic chest pain (HCC)   . Acute non-ST elevation myocardial infarction (NSTEMI) (HCC) 08/24/2015  . NSTEMI (non-ST elevated myocardial infarction) (HCC)   . Angina pectoris (HCC)   . SOB (shortness of breath)   . Chronic diastolic CHF (congestive heart failure) (HCC)   . Centrilobular emphysema (HCC)   . Coronary artery disease involving coronary bypass graft of native heart with angina pectoris with documented spasm (HCC)   . Hyperlipidemia   . ED (erectile dysfunction) 10/06/2014  . Type 2 diabetes mellitus with chronic kidney disease (HCC) 05/26/2014  . Personal history of other malignant neoplasm of skin 03/19/2012  . OSA on CPAP 08/19/2009  . DYSPNEA 07/17/2009  . SNORING 07/17/2009  . HYPERLIPIDEMIA-MIXED 07/02/2009  . HYPERTENSION, UNSPECIFIED 07/02/2009  . CORONARY ATHEROSCLEROSIS OF ARTERY BYPASS GRAFT 07/02/2009  . CARDIOMYOPATHY, ISCHEMIC 07/02/2009    Past Surgical History:  Procedure Laterality Date  . BACK SURGERY     Multiple, with spinal stenosis  . BASCILIC VEIN TRANSPOSITION Left 12/11/2015   Procedure: BASCILIC VEIN TRANSPOSITION ( STAGE 1 );  Surgeon: Renford DillsGregory G Schnier,  MD;  Location: [facility] ORS;  Service: Vascular;  Laterality: Left;  . CARDIAC CATHETERIZATION    . CATARACT EXTRACTION    . CORONARY ARTERY BYPASS GRAFT  1999   In [place] with LIMA-LAD and SVG-OM1  . EYE SURGERY Bilateral    Cataract extraction with IOL  . INSERTION OF DIALYSIS CATHETER Right    Dr. Wyn Quaker, [facility]  . JOINT REPLACEMENT Left    Total Knee Replacement  . PERIPHERAL VASCULAR CATHETERIZATION N/A  09/01/2015   Procedure: Dialysis/Perma Catheter Insertion;  Surgeon: Renford Dills, MD;  Location: [facility] INVASIVE CV LAB;  Service: Cardiovascular;  Laterality: N/A;  . PERIPHERAL VASCULAR CATHETERIZATION Left 02/25/2016   Procedure: Dialysis/Perma Catheter Removal;  Surgeon: Annice Needy, MD;  Location: [facility] INVASIVE CV LAB;  Service: Cardiovascular;  Laterality: Left;  . PERIPHERAL VASCULAR CATHETERIZATION Left 03/22/2016   Procedure: A/V Shuntogram/Fistulagram;  Surgeon: Renford Dills, MD;  Location: [facility] INVASIVE CV LAB;  Service: Cardiovascular;  Laterality: Left;  . TOTAL KNEE ARTHROPLASTY  5/10    Prior to Admission medications   Medication Sig Start Date End Date Taking? Authorizing Provider  albuterol (PROVENTIL HFA;VENTOLIN HFA) 108 (90 Base) MCG/ACT inhaler Inhale 2 puffs into the lungs every 6 (six) hours as needed for wheezing or shortness of breath. 09/14/15   Sondra Barges, PA-C  aspirin EC 81 MG tablet Take 81 mg by mouth daily.    Historical Provider, MD  cinacalcet (SENSIPAR) 30 MG tablet Take 1 tablet by mouth daily. 12/17/15   Historical Provider, MD  ferrous sulfate 325 (65 FE) MG tablet Take 325 mg by mouth daily with breakfast.     Historical Provider, MD  hydrocortisone valerate ointment (WESTCORT) 0.2 % Apply to affected area daily 06/20/16 06/20/17  Joni Reining, PA-C  hydrOXYzine (ATARAX) 10 MG/5ML syrup Take 5 mLs (10 mg total) by mouth 3 (three) times daily as needed for itching. 06/20/16   Joni Reining, PA-C  Insulin Detemir (LEVEMIR FLEXTOUCH) 100 UNIT/ML Pen Inject 22 Units into the skin daily before breakfast.    Historical Provider, MD  losartan (COZAAR) 50 MG tablet Take 50 mg by mouth daily.    Historical Provider, MD  lovastatin (MEVACOR) 20 MG tablet Take 20 mg by mouth at bedtime.    Historical Provider, MD  Melatonin 5 MG TABS Take 1 tablet by mouth at bedtime.    Historical Provider, MD  metoprolol succinate (TOPROL-XL) 50 MG 24 hr tablet Take 50 mg by  mouth daily. Take with or immediately following a meal.    Historical Provider, MD  sertraline (ZOLOFT) 25 MG tablet Take 25 mg by mouth daily.    Historical Provider, MD  tamsulosin (FLOMAX) 0.4 MG CAPS capsule Take 1 capsule (0.4 mg total) by mouth daily. 09/04/15   Ramonita Lab, MD    Allergies Bee venom; Beef-derived products; Iodine; and Ivp dye [iodinated diagnostic agents]  Family History  Problem Relation Age of Onset  . Kidney failure Mother   . Heart failure Father     Social History Social History  Substance Use Topics  . Smoking status: Former Smoker    Types: Cigarettes  . Smokeless tobacco: Never Used  . Alcohol use No    Review of Systems Constitutional: No fever/chills Eyes: No visual changes. ENT: No sore throat. Cardiovascular: Denies chest pain. Respiratory: Denies shortness of breath. Gastrointestinal: No abdominal pain.  No nausea, no vomiting.  No diarrhea.  No constipation. Genitourinary: Negative for dysuria. Musculoskeletal: Negative for back  pain. Skin: Positive for rash. Neurological: Negative for headaches, focal weakness or numbness. Psychiatric:Anxiety Endocrine:Diabetes, hypertension, and hyperlipidemia. Hematological/Lymphatic: Allergic/Immunilogical: Anemia and B12 deficiency 10-point ROS otherwise negative.  ____________________________________________   PHYSICAL EXAM:  VITAL SIGNS: ED Triage Vitals  Enc Vitals Group     BP 06/20/16 1132 (!) 153/115     Pulse Rate 06/20/16 1132 86     Resp 06/20/16 1132 20     Temp 06/20/16 1132 98 F (36.7 C)     Temp Source 06/20/16 1132 Oral     SpO2 06/20/16 1132 100 %     Weight 06/20/16 1131 175 lb (79.4 kg)     Height 06/20/16 1131 5\' 6"  (1.676 m)     Head Circumference --      Peak Flow --      Pain Score 06/20/16 1131 0     Pain Loc --      Pain Edu? --      Excl. in GC? --     Constitutional: Alert and oriented. Well appearing and in no acute distress. Eyes: Conjunctivae  are normal. PERRL. EOMI. Head: Atraumatic. Nose: No congestion/rhinnorhea. Mouth/Throat: Mucous membranes are moist.  Oropharynx non-erythematous. Neck: No stridor.  No cervical spine tenderness to palpation. Hematological/Lymphatic/Immunilogical: No cervical lymphadenopathy. Cardiovascular: Normal rate, regular rhythm. Grossly normal heart sounds.  Good peripheral circulation. Respiratory: Normal respiratory effort.  No retractions. Lungs CTAB. Gastrointestinal: Soft and nontender. No distention. No abdominal bruits. No CVA tenderness. Musculoskeletal: No lower extremity tenderness nor edema.  No joint effusions. Neurologic:  Normal speech and language. No gross focal neurologic deficits are appreciated. No gait instability. Skin:  Skin is warm, dry and intact. Patient has mild wheals lesions are consistent with dry skin. No signs symptoms secondary infection. Psychiatric: Mood and affect are normal. Speech and behavior are normal.  ____________________________________________   LABS (all labs ordered are listed, but only abnormal results are displayed)  Labs Reviewed - No data to display ____________________________________________  EKG   ____________________________________________  RADIOLOGY   ____________________________________________   PROCEDURES  Procedure(s) performed: None  Procedures  Critical Care performed: No  ____________________________________________   INITIAL IMPRESSION / ASSESSMENT AND PLAN / ED COURSE  Pertinent labs & imaging results that were available during my care of the patient were reviewed by me and considered in my medical decision making (see chart for details).  Atopic dermatitis. Patient given discharge care instructions. Patient advised to follow with dermatology condition persists. Patient given a prescription for Atarax elixir and Westcort.  Clinical Course     ____________________________________________   FINAL CLINICAL  IMPRESSION(S) / ED DIAGNOSES  Final diagnoses:  Rash and nonspecific skin eruption      NEW MEDICATIONS STARTED DURING THIS VISIT:  New Prescriptions   HYDROCORTISONE VALERATE OINTMENT (WESTCORT) 0.2 %    Apply to affected area daily   HYDROXYZINE (ATARAX) 10 MG/5ML SYRUP    Take 5 mLs (10 mg total) by mouth 3 (three) times daily as needed for itching.     Note:  This document was prepared using Dragon voice recognition software and may include unintentional dictation errors.    Joni Reiningonald K Koreen Lizaola, PA-C 06/20/16 1323    Emily FilbertJonathan E Williams, MD 06/20/16 [phone]

## 2016-06-20 NOTE — Discharge Instructions (Signed)
Advised over-the-counter lotion after bathing. Take medication as directed and follow-up with dermatologist condition persists.

## 2016-07-14 ENCOUNTER — Inpatient Hospital Stay
Admission: EM | Admit: 2016-07-14 | Discharge: 2016-07-15 | DRG: 189 | Disposition: A | Discharge status: Home / Self Care | Payer: Medicare Other | Attending: Internal Medicine | Admitting: Internal Medicine

## 2016-07-14 ENCOUNTER — Emergency Department: Payer: Medicare Other

## 2016-07-14 DIAGNOSIS — I255 Ischemic cardiomyopathy: Secondary | ICD-10-CM | POA: Diagnosis not present

## 2016-07-14 DIAGNOSIS — Z91018 Allergy to other foods: Secondary | ICD-10-CM

## 2016-07-14 DIAGNOSIS — Z8249 Family history of ischemic heart disease and other diseases of the circulatory system: Secondary | ICD-10-CM

## 2016-07-14 DIAGNOSIS — J90 Pleural effusion, not elsewhere classified: Secondary | ICD-10-CM

## 2016-07-14 DIAGNOSIS — E785 Hyperlipidemia, unspecified: Secondary | ICD-10-CM | POA: Diagnosis present

## 2016-07-14 DIAGNOSIS — I2510 Atherosclerotic heart disease of native coronary artery without angina pectoris: Secondary | ICD-10-CM | POA: Diagnosis present

## 2016-07-14 DIAGNOSIS — Z91030 Bee allergy status: Secondary | ICD-10-CM

## 2016-07-14 DIAGNOSIS — Z951 Presence of aortocoronary bypass graft: Secondary | ICD-10-CM

## 2016-07-14 DIAGNOSIS — Z87891 Personal history of nicotine dependence: Secondary | ICD-10-CM | POA: Diagnosis not present

## 2016-07-14 DIAGNOSIS — J811 Chronic pulmonary edema: Secondary | ICD-10-CM | POA: Diagnosis present

## 2016-07-14 DIAGNOSIS — J918 Pleural effusion in other conditions classified elsewhere: Secondary | ICD-10-CM | POA: Diagnosis not present

## 2016-07-14 DIAGNOSIS — F419 Anxiety disorder, unspecified: Secondary | ICD-10-CM | POA: Diagnosis present

## 2016-07-14 DIAGNOSIS — Z91041 Radiographic dye allergy status: Secondary | ICD-10-CM

## 2016-07-14 DIAGNOSIS — N2581 Secondary hyperparathyroidism of renal origin: Secondary | ICD-10-CM | POA: Diagnosis present

## 2016-07-14 DIAGNOSIS — N186 End stage renal disease: Secondary | ICD-10-CM | POA: Diagnosis not present

## 2016-07-14 DIAGNOSIS — D631 Anemia in chronic kidney disease: Secondary | ICD-10-CM | POA: Diagnosis present

## 2016-07-14 DIAGNOSIS — E8770 Fluid overload, unspecified: Secondary | ICD-10-CM | POA: Diagnosis not present

## 2016-07-14 DIAGNOSIS — Z87442 Personal history of urinary calculi: Secondary | ICD-10-CM | POA: Diagnosis not present

## 2016-07-14 DIAGNOSIS — E1122 Type 2 diabetes mellitus with diabetic chronic kidney disease: Secondary | ICD-10-CM | POA: Diagnosis present

## 2016-07-14 DIAGNOSIS — Z992 Dependence on renal dialysis: Secondary | ICD-10-CM | POA: Diagnosis not present

## 2016-07-14 DIAGNOSIS — G4733 Obstructive sleep apnea (adult) (pediatric): Secondary | ICD-10-CM | POA: Diagnosis present

## 2016-07-14 DIAGNOSIS — Z841 Family history of disorders of kidney and ureter: Secondary | ICD-10-CM | POA: Diagnosis not present

## 2016-07-14 DIAGNOSIS — I120 Hypertensive chronic kidney disease with stage 5 chronic kidney disease or end stage renal disease: Secondary | ICD-10-CM | POA: Diagnosis not present

## 2016-07-14 DIAGNOSIS — R0603 Acute respiratory distress: Secondary | ICD-10-CM | POA: Diagnosis present

## 2016-07-14 DIAGNOSIS — J810 Acute pulmonary edema: Secondary | ICD-10-CM | POA: Diagnosis present

## 2016-07-14 LAB — COMPREHENSIVE METABOLIC PANEL
ALK PHOS: 61 U/L (ref 38–126)
ALT: 6 U/L — AB (ref 17–63)
AST: 16 U/L (ref 15–41)
Albumin: 3.4 g/dL — ABNORMAL LOW (ref 3.5–5.0)
Anion gap: 10 (ref 5–15)
BUN: 56 mg/dL — AB (ref 6–20)
CALCIUM: 8.2 mg/dL — AB (ref 8.9–10.3)
CHLORIDE: 100 mmol/L — AB (ref 101–111)
CO2: 27 mmol/L (ref 22–32)
CREATININE: 6.35 mg/dL — AB (ref 0.61–1.24)
GFR calc Af Amer: 8 mL/min — ABNORMAL LOW (ref >60)
GFR calc non Af Amer: 7 mL/min — ABNORMAL LOW (ref >60)
Glucose, Bld: 160 mg/dL — ABNORMAL HIGH (ref 65–99)
Potassium: 5.5 mmol/L — ABNORMAL HIGH (ref 3.5–5.1)
SODIUM: 137 mmol/L (ref 135–145)
Total Bilirubin: 0.7 mg/dL (ref 0.3–1.2)
Total Protein: 8 g/dL (ref 6.5–8.1)

## 2016-07-14 LAB — CBC
HCT: 37.1 % — ABNORMAL LOW (ref 40.0–52.0)
HEMOGLOBIN: 11.9 g/dL — AB (ref 13.0–18.0)
MCH: 29.8 pg (ref 26.0–34.0)
MCHC: 32.2 g/dL (ref 32.0–36.0)
MCV: 92.5 fL (ref 80.0–100.0)
PLATELETS: 349 10*3/uL (ref 150–440)
RBC: 4.01 MIL/uL — AB (ref 4.40–5.90)
RDW: 18.4 % — ABNORMAL HIGH (ref 11.5–14.5)
WBC: 7.9 10*3/uL (ref 3.8–10.6)

## 2016-07-14 LAB — BRAIN NATRIURETIC PEPTIDE: B Natriuretic Peptide: 2419 pg/mL — ABNORMAL HIGH (ref 0.0–100.0)

## 2016-07-14 LAB — TROPONIN I: Troponin I: 0.07 ng/mL (ref <0.03)

## 2016-07-14 MED ORDER — FUROSEMIDE 10 MG/ML IJ SOLN
60.0000 mg | Freq: Once | INTRAMUSCULAR | Status: AC
  Administered 2016-07-14: 60 mg via INTRAVENOUS
  Filled 2016-07-14: qty 8

## 2016-07-14 NOTE — ED Notes (Signed)
ED Provider at bedside. 

## 2016-07-14 NOTE — ED Provider Notes (Signed)
[facility] Emergency Department Provider Note    First MD Initiated Contact with Patient 07/14/16 2323     (approximate)  I have reviewed the triage vital signs and the nursing notes.   HISTORY  Chief Complaint Shortness of Breath    HPI Kenneth [name] is a 82 y.o. male with below list of chronic medical conditions including end-stage renal disease who receives dialysis on Monday Wednesday Friday however given the holiday week schedule Sunday Tuesday and Friday this week. Patient presented to the emergency department with increasing dyspnea since 6 PM today. Patient denies any chest pain or any other complaints. Patient states that the dyspnea is worse with laying flat. Patient denies any fever no nausea no vomiting   Past Medical History:  Diagnosis Date  . Anemia   . Anxiety   . Arthritis   . B12 deficiency   . Coronary artery disease    a. 2vCABG LIMA-LAD, SVG-OM1 in 1999; b. Lexiscan myoview (5/10) showed EF 45%, HK apex & lateral wall, fixed perfusion defect involving apex, inferoapex, & inferolateral wall, also some inferoapical ischemia; LHC (5/10) showed patent LIMA-LAD and totally occluded SVG-OM1, 99% pLAD, 40% ostial CFX, 40% mCFX, 30% OM1, 20% pRCA  . Diabetes mellitus   . ESRD on hemodialysis (HCC)    a. once weekly as of 08/2015  . GERD (gastroesophageal reflux disease)   . History of MRI of chest 11/10   a. Cardiac; EF 48%, mid anterolateral and inferolateral walls, apical lateral wall, apical septal wall, and true apex were all think and akinetic; 51-75% thickness subendocardial delayed enhancement in mid anterolateral and inferolateral walls, full thickness enhancement in apical laterla and apical septal walls and true apex  . Hyperlipidemia   . Hypertension   . Ischemic cardiomyopathy    a. likely ICM, EF 55-60%, RWMA could not be excluded, nl PASP, nl CVP  . Nephrolithiasis   . Obesity   . Occasional tremors   . OSA (obstructive  sleep apnea)    Moderate CPAP  . Shortness of breath dyspnea   . Spinal stenosis     Patient Active Problem List   Diagnosis Date Noted  . Anemia, unspecified 05/20/2016  . ESRD on dialysis (HCC) 09/28/2015  . Ischemic cardiomyopathy   . CKD (chronic kidney disease) stage 4, GFR 15-29 ml/min (HCC) 08/26/2015  . Sinus tachycardia 08/26/2015  . Demand ischemia (HCC)   . Coronary artery disease involving coronary bypass graft of native heart without angina pectoris   . Ischemic chest pain (HCC)   . Acute non-ST elevation myocardial infarction (NSTEMI) (HCC) 08/24/2015  . NSTEMI (non-ST elevated myocardial infarction) (HCC)   . Angina pectoris (HCC)   . SOB (shortness of breath)   . Chronic diastolic CHF (congestive heart failure) (HCC)   . Centrilobular emphysema (HCC)   . Coronary artery disease involving coronary bypass graft of native heart with angina pectoris with documented spasm (HCC)   . Hyperlipidemia   . ED (erectile dysfunction) 10/06/2014  . Type 2 diabetes mellitus with chronic kidney disease (HCC) 05/26/2014  . Personal history of other malignant neoplasm of skin 03/19/2012  . OSA on CPAP 08/19/2009  . DYSPNEA 07/17/2009  . SNORING 07/17/2009  . HYPERLIPIDEMIA-MIXED 07/02/2009  . HYPERTENSION, UNSPECIFIED 07/02/2009  . CORONARY ATHEROSCLEROSIS OF ARTERY BYPASS GRAFT 07/02/2009  . CARDIOMYOPATHY, ISCHEMIC 07/02/2009    Past Surgical History:  Procedure Laterality Date  . BACK SURGERY     Multiple, with spinal stenosis  .  BASCILIC VEIN TRANSPOSITION Left 12/11/2015   Procedure: BASCILIC VEIN TRANSPOSITION ( STAGE 1 );  Surgeon: Renford Dills, MD;  Location: [facility] ORS;  Service: Vascular;  Laterality: Left;  . CARDIAC CATHETERIZATION    . CATARACT EXTRACTION    . CORONARY ARTERY BYPASS GRAFT  1999   In [place] with LIMA-LAD and SVG-OM1  . EYE SURGERY Bilateral    Cataract extraction with IOL  . INSERTION OF DIALYSIS CATHETER Right    Dr. Wyn Quaker, [facility]  .  JOINT REPLACEMENT Left    Total Knee Replacement  . PERIPHERAL VASCULAR CATHETERIZATION N/A 09/01/2015   Procedure: Dialysis/Perma Catheter Insertion;  Surgeon: Renford Dills, MD;  Location: [facility] INVASIVE CV LAB;  Service: Cardiovascular;  Laterality: N/A;  . PERIPHERAL VASCULAR CATHETERIZATION Left 02/25/2016   Procedure: Dialysis/Perma Catheter Removal;  Surgeon: Annice Needy, MD;  Location: [facility] INVASIVE CV LAB;  Service: Cardiovascular;  Laterality: Left;  . PERIPHERAL VASCULAR CATHETERIZATION Left 03/22/2016   Procedure: A/V Shuntogram/Fistulagram;  Surgeon: Renford Dills, MD;  Location: [facility] INVASIVE CV LAB;  Service: Cardiovascular;  Laterality: Left;  . TOTAL KNEE ARTHROPLASTY  5/10    Prior to Admission medications   Medication Sig Start Date End Date Taking? Authorizing Provider  albuterol (PROVENTIL HFA;VENTOLIN HFA) 108 (90 Base) MCG/ACT inhaler Inhale 2 puffs into the lungs every 6 (six) hours as needed for wheezing or shortness of breath. 09/14/15   Sondra Barges, PA-C  aspirin EC 81 MG tablet Take 81 mg by mouth daily.    Historical Provider, MD  calcium acetate (PHOSLO) 667 MG capsule Take 1 capsule by mouth 3 (three) times daily. 06/17/16   Historical Provider, MD  cinacalcet (SENSIPAR) 30 MG tablet Take 1 tablet by mouth daily. 12/17/15   Historical Provider, MD  ferrous sulfate 325 (65 FE) MG tablet Take 325 mg by mouth daily with breakfast.     Historical Provider, MD  hydrocortisone valerate ointment (WESTCORT) 0.2 % Apply to affected area daily 06/20/16 06/20/17  Joni Reining, PA-C  hydrOXYzine (ATARAX) 10 MG/5ML syrup Take 5 mLs (10 mg total) by mouth 3 (three) times daily as needed for itching. 06/20/16   Joni Reining, PA-C  Insulin Detemir (LEVEMIR FLEXTOUCH) 100 UNIT/ML Pen Inject 22 Units into the skin daily before breakfast.    Historical Provider, MD  losartan (COZAAR) 50 MG tablet Take 50 mg by mouth daily.    Historical Provider, MD  lovastatin (MEVACOR) 20 MG  tablet Take 20 mg by mouth at bedtime.    Historical Provider, MD  Melatonin 5 MG TABS Take 1 tablet by mouth at bedtime.    Historical Provider, MD  metoprolol succinate (TOPROL-XL) 50 MG 24 hr tablet Take 50 mg by mouth daily. Take with or immediately following a meal.    Historical Provider, MD  sertraline (ZOLOFT) 25 MG tablet Take 25 mg by mouth daily.    Historical Provider, MD  tamsulosin (FLOMAX) 0.4 MG CAPS capsule Take 1 capsule (0.4 mg total) by mouth daily. 09/04/15   Ramonita Lab, MD    Allergies Bee venom; Beef-derived products; Iodine; and Ivp dye [iodinated diagnostic agents]  Family History  Problem Relation Age of Onset  . Kidney failure Mother   . Heart failure Father     Social History Social History  Substance Use Topics  . Smoking status: Former Smoker    Types: Cigarettes  . Smokeless tobacco: Never Used  . Alcohol use No    Review of Systems Constitutional:  No fever/chills Eyes: No visual changes. ENT: No sore throat. Cardiovascular: Denies chest pain. Respiratory: Positive or shortness of breath. Gastrointestinal: No abdominal pain.  No nausea, no vomiting.  No diarrhea.  No constipation. Genitourinary: Negative for dysuria. Musculoskeletal: Negative for back pain. Skin: Negative for rash. Neurological: Negative for headaches, focal weakness or numbness.  10-point ROS otherwise negative.  ____________________________________________   PHYSICAL EXAM:  VITAL SIGNS: ED Triage Vitals  Enc Vitals Group     BP 07/14/16 2225 127/65     Pulse Rate 07/14/16 2225 (!) 102     Resp 07/14/16 2225 (!) 21     Temp 07/14/16 2225 98.2 F (36.8 C)     Temp Source 07/14/16 2225 Oral     SpO2 07/14/16 2225 98 %     Weight 07/14/16 2223 165 lb (74.8 kg)     Height 07/14/16 2223 5\' 6"  (1.676 m)     Head Circumference --      Peak Flow --      Pain Score 07/14/16 2223 0     Pain Loc --      Pain Edu? --      Excl. in GC? --     Constitutional: Alert and  oriented. Well appearing and in no acute distress. Eyes: Conjunctivae are normal. PERRL. EOMI. Head: Atraumatic. Nose: No congestion/rhinnorhea. Mouth/Throat: Mucous membranes are moist.  Oropharynx non-erythematous. Neck: No stridor.  No meningeal signs.  No cervical spine tenderness to palpation Cardiovascular: Normal rate, regular rhythm. Good peripheral circulation. Grossly normal heart sounds. Respiratory: Normal respiratory effort.  No retractions. Bibasilar rales Gastrointestinal: Soft and nontender. No distention.  Musculoskeletal: No lower extremity tenderness nor edema. No gross deformities of extremities. Neurologic:  Normal speech and language. No gross focal neurologic deficits are appreciated.  Skin:  Skin is warm, dry and intact. No rash noted. Psychiatric: Mood and affect are normal. Speech and behavior are normal.  ____________________________________________   LABS (all labs ordered are listed, but only abnormal results are displayed)  Labs Reviewed  CBC - Abnormal; Notable for the following:       Result Value   RBC 4.01 (*)    Hemoglobin 11.9 (*)    HCT 37.1 (*)    RDW 18.4 (*)    All other components within normal limits  COMPREHENSIVE METABOLIC PANEL - Abnormal; Notable for the following:    Potassium 5.5 (*)    Chloride 100 (*)    Glucose, Bld 160 (*)    BUN 56 (*)    Creatinine, Ser 6.35 (*)    Calcium 8.2 (*)    Albumin 3.4 (*)    ALT 6 (*)    GFR calc non Af Amer 7 (*)    GFR calc Af Amer 8 (*)    All other components within normal limits  TROPONIN I - Abnormal; Notable for the following:    Troponin I 0.07 (*)    All other components within normal limits  BRAIN NATRIURETIC PEPTIDE   ____________________________________________  EKG  ED ECG REPORT I, [place] N Reagen Haberman, the attending physician, personally viewed and interpreted this ECG.   Date: 07/14/2016  EKG Time: 10:26 PM  Rate: 102  Rhythm: Normal sinus rhythm Axis :  Normal Intervals: Normal  ST&T Change: None  ____________________________________________  RADIOLOGY I, [place] N Mcdaniel Ohms, personally viewed and evaluated these images (plain radiographs) as part of my medical decision making, as well as reviewing the written report by the radiologist.  Dg Chest 2 View  Result Date: 07/14/2016 CLINICAL DATA:  Shortness of breath.  Dialysis patient. EXAM: CHEST  2 VIEW COMPARISON:  05/17/2016 FINDINGS: Postoperative changes in the mediastinum. Cardiac enlargement. Mild diffuse interstitial pattern to the lungs suggesting edema. Bilateral pleural effusions, greater on the left. Atelectasis in the lung bases. No pneumothorax. Calcification of the aorta. Degenerative changes in the spine and shoulders. IMPRESSION: Cardiac enlargement with mild interstitial pulmonary edema. Bilateral pleural effusions with basilar atelectasis, greater on the left. Electronically Signed   By: Burman NievesWilliam  Stevens M.D.   On: 07/14/2016 22:56     Procedures   Critical Care performed:  ____________________________________________   INITIAL IMPRESSION / ASSESSMENT AND PLAN / ED COURSE  Pertinent labs & imaging results that were available during my care of the patient were reviewed by me and considered in my medical decision making (see chart for details).  Patient given Lasix 60 mg IV Patient discussed with Dr. Thedore MinsSingh nephrologist on call. Patient discussed with Dr. Emmit PomfretHugelmeyer with plan for admission.   Clinical Course     ____________________________________________  FINAL CLINICAL IMPRESSION(S) / ED DIAGNOSES  Final diagnoses:  Acute pulmonary edema (HCC)  Pleural effusion     MEDICATIONS GIVEN DURING THIS VISIT:  Medications  furosemide (LASIX) injection 60 mg (not administered)     NEW OUTPATIENT MEDICATIONS STARTED DURING THIS VISIT:  New Prescriptions   No medications on file    Modified Medications   No medications on file    Discontinued  Medications   No medications on file     Note:  This document was prepared using Dragon voice recognition software and may include unintentional dictation errors.    Darci Currentandolph N Raveena Hebdon, MD 07/15/16 [phone]

## 2016-07-14 NOTE — ED Triage Notes (Signed)
Pt bib EMS from home w/ c/o SOB.  Pt dialysis pt, normally receives dialysis M/W/F, but this week Sunday, Tuesday and Friday.  Per EMS, pt had wheezing on scene and received 2 duonebs.  170 CBG.  Pt alert and oriented to self, time and location.

## 2016-07-15 DIAGNOSIS — J918 Pleural effusion in other conditions classified elsewhere: Secondary | ICD-10-CM | POA: Diagnosis not present

## 2016-07-15 DIAGNOSIS — D631 Anemia in chronic kidney disease: Secondary | ICD-10-CM | POA: Diagnosis not present

## 2016-07-15 DIAGNOSIS — E8770 Fluid overload, unspecified: Secondary | ICD-10-CM | POA: Diagnosis not present

## 2016-07-15 DIAGNOSIS — E1122 Type 2 diabetes mellitus with diabetic chronic kidney disease: Secondary | ICD-10-CM | POA: Diagnosis not present

## 2016-07-15 DIAGNOSIS — Z8249 Family history of ischemic heart disease and other diseases of the circulatory system: Secondary | ICD-10-CM | POA: Diagnosis not present

## 2016-07-15 DIAGNOSIS — R0603 Acute respiratory distress: Secondary | ICD-10-CM | POA: Diagnosis not present

## 2016-07-15 DIAGNOSIS — G4733 Obstructive sleep apnea (adult) (pediatric): Secondary | ICD-10-CM | POA: Diagnosis not present

## 2016-07-15 DIAGNOSIS — Z992 Dependence on renal dialysis: Secondary | ICD-10-CM | POA: Diagnosis not present

## 2016-07-15 DIAGNOSIS — J811 Chronic pulmonary edema: Secondary | ICD-10-CM | POA: Diagnosis present

## 2016-07-15 DIAGNOSIS — J810 Acute pulmonary edema: Secondary | ICD-10-CM | POA: Diagnosis present

## 2016-07-15 DIAGNOSIS — I120 Hypertensive chronic kidney disease with stage 5 chronic kidney disease or end stage renal disease: Secondary | ICD-10-CM | POA: Diagnosis not present

## 2016-07-15 DIAGNOSIS — Z87891 Personal history of nicotine dependence: Secondary | ICD-10-CM | POA: Diagnosis not present

## 2016-07-15 DIAGNOSIS — I2510 Atherosclerotic heart disease of native coronary artery without angina pectoris: Secondary | ICD-10-CM | POA: Diagnosis not present

## 2016-07-15 DIAGNOSIS — Z951 Presence of aortocoronary bypass graft: Secondary | ICD-10-CM | POA: Diagnosis not present

## 2016-07-15 DIAGNOSIS — N186 End stage renal disease: Secondary | ICD-10-CM | POA: Diagnosis not present

## 2016-07-15 DIAGNOSIS — I255 Ischemic cardiomyopathy: Secondary | ICD-10-CM | POA: Diagnosis not present

## 2016-07-15 DIAGNOSIS — Z87442 Personal history of urinary calculi: Secondary | ICD-10-CM | POA: Diagnosis not present

## 2016-07-15 DIAGNOSIS — N2581 Secondary hyperparathyroidism of renal origin: Secondary | ICD-10-CM | POA: Diagnosis not present

## 2016-07-15 DIAGNOSIS — Z841 Family history of disorders of kidney and ureter: Secondary | ICD-10-CM | POA: Diagnosis not present

## 2016-07-15 LAB — CBC
HEMATOCRIT: 32.5 % — AB (ref 40.0–52.0)
HEMOGLOBIN: 10.6 g/dL — AB (ref 13.0–18.0)
MCH: 30.5 pg (ref 26.0–34.0)
MCHC: 32.5 g/dL (ref 32.0–36.0)
MCV: 93.8 fL (ref 80.0–100.0)
Platelets: 298 10*3/uL (ref 150–440)
RBC: 3.46 MIL/uL — AB (ref 4.40–5.90)
RDW: 19 % — ABNORMAL HIGH (ref 11.5–14.5)
WBC: 5.3 10*3/uL (ref 3.8–10.6)

## 2016-07-15 LAB — BASIC METABOLIC PANEL
ANION GAP: 10 (ref 5–15)
BUN: 54 mg/dL — ABNORMAL HIGH (ref 6–20)
CALCIUM: 7.9 mg/dL — AB (ref 8.9–10.3)
CHLORIDE: 102 mmol/L (ref 101–111)
CO2: 25 mmol/L (ref 22–32)
Creatinine, Ser: 6.71 mg/dL — ABNORMAL HIGH (ref 0.61–1.24)
GFR calc non Af Amer: 7 mL/min — ABNORMAL LOW (ref >60)
GFR, EST AFRICAN AMERICAN: 8 mL/min — AB (ref >60)
GLUCOSE: 108 mg/dL — AB (ref 65–99)
Potassium: 5.3 mmol/L — ABNORMAL HIGH (ref 3.5–5.1)
Sodium: 137 mmol/L (ref 135–145)

## 2016-07-15 LAB — MAGNESIUM: Magnesium: 2.1 mg/dL (ref 1.7–2.4)

## 2016-07-15 LAB — PHOSPHORUS: Phosphorus: 6.1 mg/dL — ABNORMAL HIGH (ref 2.5–4.6)

## 2016-07-15 LAB — MRSA PCR SCREENING: MRSA BY PCR: NEGATIVE

## 2016-07-15 LAB — TROPONIN I
TROPONIN I: 0.09 ng/mL — AB (ref <0.03)
Troponin I: 0.09 ng/mL (ref <0.03)

## 2016-07-15 MED ORDER — ACETAMINOPHEN 650 MG RE SUPP
650.0000 mg | Freq: Four times a day (QID) | RECTAL | Status: DC | PRN
Start: 2016-07-15 — End: 2016-07-15

## 2016-07-15 MED ORDER — ALBUTEROL SULFATE (2.5 MG/3ML) 0.083% IN NEBU: 3.0000 mL | INHALATION_SOLUTION | Freq: Four times a day (QID) | RESPIRATORY_TRACT | Status: DC | PRN

## 2016-07-15 MED ORDER — ONDANSETRON HCL 4 MG PO TABS: 4.0000 mg | ORAL_TABLET | Freq: Four times a day (QID) | ORAL | Status: DC | PRN

## 2016-07-15 MED ORDER — SENNOSIDES-DOCUSATE SODIUM 8.6-50 MG PO TABS: 1.0000 | ORAL_TABLET | Freq: Every evening | ORAL | Status: DC | PRN

## 2016-07-15 MED ORDER — SODIUM CHLORIDE 0.9% FLUSH
3.0000 mL | Freq: Two times a day (BID) | INTRAVENOUS | Status: DC
  Administered 2016-07-15 (×2): 3 mL via INTRAVENOUS

## 2016-07-15 MED ORDER — METOPROLOL SUCCINATE ER 50 MG PO TB24
50.0000 mg | ORAL_TABLET | Freq: Every day | ORAL | Status: DC
  Administered 2016-07-15: 50 mg via ORAL
  Filled 2016-07-15: qty 1

## 2016-07-15 MED ORDER — PRAVASTATIN SODIUM 20 MG PO TABS: 20.0000 mg | ORAL_TABLET | Freq: Every day | ORAL | Status: DC

## 2016-07-15 MED ORDER — EPOETIN ALFA 10000 UNIT/ML IJ SOLN
4000.0000 [IU] | Freq: Once | INTRAMUSCULAR | Status: AC
  Administered 2016-07-15: 4000 [IU] via INTRAVENOUS

## 2016-07-15 MED ORDER — MELATONIN 5 MG PO TABS
1.0000 | ORAL_TABLET | Freq: Every day | ORAL | Status: DC
  Administered 2016-07-15: 5 mg via ORAL
  Filled 2016-07-15 (×2): qty 1

## 2016-07-15 MED ORDER — ACETAMINOPHEN 325 MG PO TABS: 650.0000 mg | ORAL_TABLET | Freq: Four times a day (QID) | ORAL | Status: DC | PRN

## 2016-07-15 MED ORDER — TIOTROPIUM BROMIDE MONOHYDRATE 18 MCG IN CAPS
1.0000 | ORAL_CAPSULE | Freq: Every day | RESPIRATORY_TRACT | Status: DC
  Administered 2016-07-15: 18 ug via RESPIRATORY_TRACT
  Filled 2016-07-15: qty 5

## 2016-07-15 MED ORDER — CINACALCET HCL 30 MG PO TABS
30.0000 mg | ORAL_TABLET | Freq: Every day | ORAL | Status: DC
Start: 2016-07-15 — End: 2016-07-15
  Administered 2016-07-15: 30 mg via ORAL
  Filled 2016-07-15: qty 1

## 2016-07-15 MED ORDER — SODIUM CHLORIDE 0.9% FLUSH: 3.0000 mL | INTRAVENOUS | Status: DC | PRN

## 2016-07-15 MED ORDER — HYDROXYZINE HCL 10 MG/5ML PO SYRP
10.0000 mg | ORAL_SOLUTION | Freq: Three times a day (TID) | ORAL | Status: DC | PRN
  Filled 2016-07-15: qty 5

## 2016-07-15 MED ORDER — ASPIRIN EC 81 MG PO TBEC
81.0000 mg | DELAYED_RELEASE_TABLET | Freq: Every day | ORAL | Status: DC
  Administered 2016-07-15: 81 mg via ORAL
  Filled 2016-07-15: qty 1

## 2016-07-15 MED ORDER — SODIUM CHLORIDE 0.9% FLUSH
3.0000 mL | Freq: Two times a day (BID) | INTRAVENOUS | Status: DC
  Administered 2016-07-15: 3 mL via INTRAVENOUS

## 2016-07-15 MED ORDER — FERROUS SULFATE 325 (65 FE) MG PO TABS
325.0000 mg | ORAL_TABLET | Freq: Every day | ORAL | Status: DC
  Administered 2016-07-15: 325 mg via ORAL
  Filled 2016-07-15: qty 1

## 2016-07-15 MED ORDER — ONDANSETRON HCL 4 MG/2ML IJ SOLN: 4.0000 mg | Freq: Four times a day (QID) | INTRAMUSCULAR | Status: DC | PRN

## 2016-07-15 MED ORDER — SODIUM CHLORIDE 0.9 % IV SOLN: 250.0000 mL | INTRAVENOUS | Status: DC | PRN

## 2016-07-15 MED ORDER — HEPARIN SODIUM (PORCINE) 5000 UNIT/ML IJ SOLN
5000.0000 [IU] | Freq: Three times a day (TID) | INTRAMUSCULAR | Status: DC
  Administered 2016-07-15 (×2): 5000 [IU] via SUBCUTANEOUS
  Filled 2016-07-15 (×2): qty 1

## 2016-07-15 MED ORDER — INSULIN DETEMIR 100 UNIT/ML ~~LOC~~ SOLN
22.0000 [IU] | Freq: Every day | SUBCUTANEOUS | Status: DC
  Administered 2016-07-15: 22 [IU] via SUBCUTANEOUS
  Filled 2016-07-15: qty 0.22

## 2016-07-15 MED ORDER — CALCIUM ACETATE (PHOS BINDER) 667 MG PO CAPS
667.0000 mg | ORAL_CAPSULE | Freq: Three times a day (TID) | ORAL | Status: DC
  Administered 2016-07-15: 667 mg via ORAL
  Filled 2016-07-15: qty 1

## 2016-07-15 MED ORDER — TRAZODONE HCL 50 MG PO TABS
50.0000 mg | ORAL_TABLET | Freq: Every day | ORAL | Status: DC
  Administered 2016-07-15: 50 mg via ORAL
  Filled 2016-07-15: qty 1

## 2016-07-15 MED ORDER — LOSARTAN POTASSIUM 50 MG PO TABS
50.0000 mg | ORAL_TABLET | Freq: Every day | ORAL | Status: DC
  Administered 2016-07-15: 50 mg via ORAL
  Filled 2016-07-15: qty 1

## 2016-07-15 MED ORDER — MAGNESIUM CITRATE PO SOLN: 1.0000 | Freq: Once | ORAL | Status: DC | PRN

## 2016-07-15 MED ORDER — SERTRALINE HCL 50 MG PO TABS
25.0000 mg | ORAL_TABLET | Freq: Every day | ORAL | Status: DC
  Administered 2016-07-15: 25 mg via ORAL
  Filled 2016-07-15: qty 2

## 2016-07-15 MED ORDER — ZOLPIDEM TARTRATE 5 MG PO TABS: 5.0000 mg | ORAL_TABLET | Freq: Every evening | ORAL | Status: DC | PRN

## 2016-07-15 MED ORDER — HYDROCODONE-ACETAMINOPHEN 5-325 MG PO TABS: 1.0000 | ORAL_TABLET | ORAL | Status: DC | PRN

## 2016-07-15 MED ORDER — TAMSULOSIN HCL 0.4 MG PO CAPS
0.4000 mg | ORAL_CAPSULE | Freq: Every day | ORAL | Status: DC
Start: 2016-07-15 — End: 2016-07-15
  Administered 2016-07-15: 0.4 mg via ORAL
  Filled 2016-07-15: qty 1

## 2016-07-15 MED ORDER — BISACODYL 5 MG PO TBEC: 5.0000 mg | DELAYED_RELEASE_TABLET | Freq: Every day | ORAL | Status: DC | PRN

## 2016-07-15 NOTE — Progress Notes (Signed)
Started HD  

## 2016-07-15 NOTE — Discharge Summary (Signed)
[facility] - [place] at [address] [facility]   PATIENT NAME: Kenneth [name]    MR#:  [id number]  DATE OF BIRTH:  July 07, 1928  DATE OF ADMISSION:  07/14/2016 ADMITTING PHYSICIAN: Tonye RoyaltyAlexis Hugelmeyer, DO  DATE OF DISCHARGE: 07/15/16  PRIMARY CARE PHYSICIAN: Barbette ReichmannHANDE,VISHWANATH, MD    ADMISSION DIAGNOSIS:  Acute pulmonary edema (HCC) [J81.0] Pleural effusion [J90]  DISCHARGE DIAGNOSIS:  Active Problems:   Pulmonary edema secondary to volume overload ESRD on dialysis  SECONDARY DIAGNOSIS:   Past Medical History:  Diagnosis Date  . Anemia   . Anxiety   . Arthritis   . B12 deficiency   . Coronary artery disease    a. 2vCABG LIMA-LAD, SVG-OM1 in 1999; b. Lexiscan myoview (5/10) showed EF 45%, HK apex & lateral wall, fixed perfusion defect involving apex, inferoapex, & inferolateral wall, also some inferoapical ischemia; LHC (5/10) showed patent LIMA-LAD and totally occluded SVG-OM1, 99% pLAD, 40% ostial CFX, 40% mCFX, 30% OM1, 20% pRCA  . Diabetes mellitus   . ESRD on hemodialysis (HCC)    a. once weekly as of 08/2015  . GERD (gastroesophageal reflux disease)   . History of MRI of chest 11/10   a. Cardiac; EF 48%, mid anterolateral and inferolateral walls, apical lateral wall, apical septal wall, and true apex were all think and akinetic; 51-75% thickness subendocardial delayed enhancement in mid anterolateral and inferolateral walls, full thickness enhancement in apical laterla and apical septal walls and true apex  . Hyperlipidemia   . Hypertension   . Ischemic cardiomyopathy    a. likely ICM, EF 55-60%, RWMA could not be excluded, nl PASP, nl CVP  . Nephrolithiasis   . Obesity   . Occasional tremors   . OSA (obstructive sleep apnea)    Moderate CPAP  . Shortness of breath dyspnea   . Spinal stenosis     HOSPITAL COURSE:  Kenneth [name]  is a 82 y.o. male admitted 07/14/2016 with chief complaint Shortness of Breath . Please see H&P performed by Tonye RoyaltyAlexis Hugelmeyer,  DO for further information. Patient presented with the above symptoms found to have pulmonary edema in relation to change in dialysis schedule. Recieved HD and improved  DISCHARGE CONDITIONS:   stable  CONSULTS OBTAINED:  Treatment Team:  Mosetta PigeonHarmeet Singh, MD  DRUG ALLERGIES:   Allergies  Allergen Reactions  . Bee Venom   . Beef-Derived Products Hives  . Iodine Hives  . Ivp Dye [Iodinated Diagnostic Agents] Hives    DISCHARGE MEDICATIONS:   Current Discharge Medication List    CONTINUE these medications which have NOT CHANGED   Details  albuterol (PROVENTIL HFA;VENTOLIN HFA) 108 (90 Base) MCG/ACT inhaler Inhale 2 puffs into the lungs every 6 (six) hours as needed for wheezing or shortness of breath. Qty: 1 Inhaler, Refills: 6    aspirin EC 81 MG tablet Take 81 mg by mouth daily.    calcium acetate (PHOSLO) 667 MG capsule Take 1 capsule by mouth 3 (three) times daily.    cinacalcet (SENSIPAR) 30 MG tablet Take 1 tablet by mouth daily.    ferrous sulfate 325 (65 FE) MG tablet Take 325 mg by mouth daily with breakfast.     hydrocortisone valerate ointment (WESTCORT) 0.2 % Apply to affected area daily Qty: 45 g, Refills: 1    hydrOXYzine (ATARAX) 10 MG/5ML syrup Take 5 mLs (10 mg total) by mouth 3 (three) times daily as needed for itching. Qty: 118 mL, Refills: 0    Insulin Detemir (LEVEMIR FLEXTOUCH) 100 UNIT/ML Pen  Inject 22 Units into the skin daily before breakfast.    lovastatin (MEVACOR) 20 MG tablet Take 20 mg by mouth at bedtime.    Melatonin 5 MG TABS Take 1 tablet by mouth at bedtime.    metoprolol succinate (TOPROL-XL) 50 MG 24 hr tablet Take 50 mg by mouth daily. Take with or immediately following a meal.    sertraline (ZOLOFT) 25 MG tablet Take 25 mg by mouth daily.    SPIRIVA HANDIHALER 18 MCG inhalation capsule Place 1 capsule into inhaler and inhale daily.    tamsulosin (FLOMAX) 0.4 MG CAPS capsule Take 1 capsule (0.4 mg total) by mouth daily. Qty: 30  capsule, Refills: 0    traZODone (DESYREL) 50 MG tablet Take 1 tablet by mouth at bedtime.    losartan (COZAAR) 50 MG tablet Take 50 mg by mouth daily.         DISCHARGE INSTRUCTIONS:    DIET:  Renal diet  DISCHARGE CONDITION:  Good  ACTIVITY:  Activity as tolerated  OXYGEN:  Home Oxygen: No.   Oxygen Delivery: room air  DISCHARGE LOCATION:  home   If you experience worsening of your admission symptoms, develop shortness of breath, life threatening emergency, suicidal or homicidal thoughts you must seek medical attention immediately by calling 911 or calling your MD immediately  if symptoms less severe.  You Must read complete instructions/literature along with all the possible adverse reactions/side effects for all the Medicines you take and that have been prescribed to you. Take any new Medicines after you have completely understood and accpet all the possible adverse reactions/side effects.   Please note  You were cared for by a hospitalist during your hospital stay. If you have any questions about your discharge medications or the care you received while you were in the hospital after you are discharged, you can call the unit and asked to speak with the hospitalist on call if the hospitalist that took care of you is not available. Once you are discharged, your primary care physician will handle any further medical issues. Please note that NO REFILLS for any discharge medications will be authorized once you are discharged, as it is imperative that you return to your primary care physician (or establish a relationship with a primary care physician if you do not have one) for your aftercare needs so that they can reassess your need for medications and monitor your lab values.    On the day of Discharge:   VITAL SIGNS:  Blood pressure 127/64, pulse 84, temperature 97.8 F (36.6 C), temperature source Oral, resp. rate 17, height 5\' 6"  (1.676 m), weight 77.2 kg (170 lb 3.1  oz), SpO2 99 %.  I/O:   Intake/Output Summary (Last 24 hours) at 07/15/16 1134 Last data filed at 07/15/16 1000  Gross per 24 hour  Intake              246 ml  Output               50 ml  Net              196 ml    PHYSICAL EXAMINATION:  GENERAL:  82 y.o.-year-old patient lying in the bed with no acute distress.  EYES: Pupils equal, round, reactive to light and accommodation. No scleral icterus. Extraocular muscles intact.  HEENT: Head atraumatic, normocephalic. Oropharynx and nasopharynx clear.  NECK:  Supple, no jugular venous distention. No thyroid enlargement, no tenderness.  LUNGS: Normal breath sounds bilaterally, no wheezing,  rales,rhonchi or crepitation. No use of accessory muscles of respiration.  CARDIOVASCULAR: S1, S2 normal. No murmurs, rubs, or gallops.  ABDOMEN: Soft, non-tender, non-distended. Bowel sounds present. No organomegaly or mass.  EXTREMITIES: No pedal edema, cyanosis, or clubbing.  NEUROLOGIC: Cranial nerves II through XII are intact. Muscle strength 5/5 in all extremities. Sensation intact. Gait not checked.  PSYCHIATRIC: The patient is alert and oriented x 3.  SKIN: No obvious rash, lesion, or ulcer.   DATA REVIEW:   CBC  Recent Labs Lab 07/15/16 0448  WBC 5.3  HGB 10.6*  HCT 32.5*  PLT 298    Chemistries   Recent Labs Lab 07/14/16 2226 07/15/16 0448  NA 137 137  K 5.5* 5.3*  CL 100* 102  CO2 27 25  GLUCOSE 160* 108*  BUN 56* 54*  CREATININE 6.35* 6.71*  CALCIUM 8.2* 7.9*  MG  --  2.1  AST 16  --   ALT 6*  --   ALKPHOS 61  --   BILITOT 0.7  --     Cardiac Enzymes  Recent Labs Lab 07/15/16 1024  TROPONINI 0.09*    Microbiology Results  Results for orders placed or performed during the hospital encounter of 07/14/16  MRSA PCR Screening     Status: None   Collection Time: 07/15/16  1:41 AM  Result Value Ref Range Status   MRSA by PCR NEGATIVE NEGATIVE Final    Comment:        The GeneXpert MRSA Assay (FDA approved for  NASAL specimens only), is one component of a comprehensive MRSA colonization surveillance program. It is not intended to diagnose MRSA infection nor to guide or monitor treatment for MRSA infections.     RADIOLOGY:  Dg Chest 2 View  Result Date: 07/14/2016 CLINICAL DATA:  Shortness of breath.  Dialysis patient. EXAM: CHEST  2 VIEW COMPARISON:  05/17/2016 FINDINGS: Postoperative changes in the mediastinum. Cardiac enlargement. Mild diffuse interstitial pattern to the lungs suggesting edema. Bilateral pleural effusions, greater on the left. Atelectasis in the lung bases. No pneumothorax. Calcification of the aorta. Degenerative changes in the spine and shoulders. IMPRESSION: Cardiac enlargement with mild interstitial pulmonary edema. Bilateral pleural effusions with basilar atelectasis, greater on the left. Electronically Signed   By: Burman Nieves M.D.   On: 07/14/2016 22:56     Management plans discussed with the patient, family and they are in agreement.  CODE STATUS:     Code Status Orders        Start     Ordered   07/15/16 0100  Full code  Continuous     07/15/16 0059    Code Status History    Date Active Date Inactive Code Status Order ID Comments User Context   03/22/2016  3:31 PM 03/23/2016 12:54 PM Full Code [id number]  Renford Dills, MD Inpatient   08/24/2015  4:21 PM 09/04/2015  8:59 PM Full Code [id number]  Enedina Finner, MD Inpatient    Advance Directive Documentation   Flowsheet Row Most Recent Value  Type of Advance Directive  Healthcare Power of Attorney, Living will  Pre-existing out of facility DNR order (yellow form or pink MOST form)  No data  "MOST" Form in Place?  No data      TOTAL TIME TAKING CARE OF THIS PATIENT: 33 minutes.    Hower,  Mardi Mainland.D on 07/15/2016 at 11:34 AM  Between 7am to 6pm - Pager - [phone]  After 6pm go to [email or web address] - password [email or web address]  [facility]  [employer] [facility] [place] [facility]  Office  [phone]  CC: Primary  care physician; Barbette ReichmannHANDE,VISHWANATH, MD

## 2016-07-15 NOTE — H&P (Signed)
[facility] [place] [facility]   PATIENT NAME: Kenneth CalixHollan Defranco    MR#:  [id number]  DATE OF BIRTH:  1927-12-27  DATE OF ADMISSION:  07/14/2016  PRIMARY CARE PHYSICIAN: Barbette ReichmannHANDE,VISHWANATH, MD   REQUESTING/REFERRING PHYSICIAN:   CHIEF COMPLAINT:   Chief Complaint  Patient presents with  . Shortness of Breath    HISTORY OF PRESENT ILLNESS: Kenneth [name]  is a 82 y.o. male with a known history of End-stage renal disease on dialysis, diabetes mellitus type 2, coronary artery disease, CABG, anemia of chronic disease, anxiety disorder, GERD presented to the emergency room with increased shortness of breath since 6 PM yesterday evening. Patient  usually gets dialyzed on Monday Wednesday Friday. Patient is due for dialysis on Friday morning. Patient also complains of cough which is dry in nature. No history of any fever or chills. Patient was brought to the emergency room by EMS because of increased respiratory distress. He was put on oxygen via nasal cannula and stabilized. Patient received IV Lasix in the emergency room but does not have any urine output. Case was discussed by ER attending with nephrology attending for dialysis. No complaints of any chest pain. No history of any syncope or seizure. Patient was worked up with chest x-ray which showed bilateral effusions and fluid overload. Troponin was slightly elevated which could be secondary to renal failure.  PAST MEDICAL HISTORY:   Past Medical History:  Diagnosis Date  . Anemia   . Anxiety   . Arthritis   . B12 deficiency   . Coronary artery disease    a. 2vCABG LIMA-LAD, SVG-OM1 in 1999; b. Lexiscan myoview (5/10) showed EF 45%, HK apex & lateral wall, fixed perfusion defect involving apex, inferoapex, & inferolateral wall, also some inferoapical ischemia; LHC (5/10) showed patent LIMA-LAD and totally occluded SVG-OM1, 99% pLAD, 40% ostial CFX, 40% mCFX, 30% OM1, 20% pRCA  . Diabetes mellitus   . ESRD on  hemodialysis (HCC)    a. once weekly as of 08/2015  . GERD (gastroesophageal reflux disease)   . History of MRI of chest 11/10   a. Cardiac; EF 48%, mid anterolateral and inferolateral walls, apical lateral wall, apical septal wall, and true apex were all think and akinetic; 51-75% thickness subendocardial delayed enhancement in mid anterolateral and inferolateral walls, full thickness enhancement in apical laterla and apical septal walls and true apex  . Hyperlipidemia   . Hypertension   . Ischemic cardiomyopathy    a. likely ICM, EF 55-60%, RWMA could not be excluded, nl PASP, nl CVP  . Nephrolithiasis   . Obesity   . Occasional tremors   . OSA (obstructive sleep apnea)    Moderate CPAP  . Shortness of breath dyspnea   . Spinal stenosis     PAST SURGICAL HISTORY: Past Surgical History:  Procedure Laterality Date  . BACK SURGERY     Multiple, with spinal stenosis  . BASCILIC VEIN TRANSPOSITION Left 12/11/2015   Procedure: BASCILIC VEIN TRANSPOSITION ( STAGE 1 );  Surgeon: Renford DillsGregory G Schnier, MD;  Location: [facility] ORS;  Service: Vascular;  Laterality: Left;  . CARDIAC CATHETERIZATION    . CATARACT EXTRACTION    . CORONARY ARTERY BYPASS GRAFT  1999   In [place] with LIMA-LAD and SVG-OM1  . EYE SURGERY Bilateral    Cataract extraction with IOL  . INSERTION OF DIALYSIS CATHETER Right    [facility]  . JOINT REPLACEMENT Left    Total Knee Replacement  . PERIPHERAL  VASCULAR CATHETERIZATION N/A 09/01/2015   Procedure: Dialysis/Perma Catheter Insertion;  Surgeon: Renford DillsGregory G Schnier, MD;  Location: [facility] INVASIVE CV LAB;  Service: Cardiovascular;  Laterality: N/A;  . PERIPHERAL VASCULAR CATHETERIZATION Left 02/25/2016   Procedure: Dialysis/Perma Catheter Removal;  Surgeon: Annice NeedyJason S Dew, MD;  Location: [facility] INVASIVE CV LAB;  Service: Cardiovascular;  Laterality: Left;  . PERIPHERAL VASCULAR CATHETERIZATION Left 03/22/2016   Procedure: A/V Shuntogram/Fistulagram;  Surgeon: Renford DillsGregory G Schnier, MD;   Location: [facility] INVASIVE CV LAB;  Service: Cardiovascular;  Laterality: Left;  . TOTAL KNEE ARTHROPLASTY  5/10    SOCIAL HISTORY:  Social History  Substance Use Topics  . Smoking status: Former Smoker    Types: Cigarettes  . Smokeless tobacco: Never Used  . Alcohol use No    FAMILY HISTORY:  Family History  Problem Relation Age of Onset  . Kidney failure Mother   . Heart failure Father     DRUG ALLERGIES:  Allergies  Allergen Reactions  . Bee Venom   . Beef-Derived Products Hives  . Iodine Hives  . Ivp Dye [Iodinated Diagnostic Agents] Hives    REVIEW OF SYSTEMS:   CONSTITUTIONAL: No fever, fatigue or weakness.  EYES: No blurred or double vision.  EARS, NOSE, AND THROAT: No tinnitus or ear pain.  RESPIRATORY: Has dry cough, shortness of breath, No wheezing or hemoptysis.  CARDIOVASCULAR: No chest pain, orthopnea, edema.  GASTROINTESTINAL: No nausea, vomiting, diarrhea or abdominal pain.  GENITOURINARY: No dysuria, hematuria.  ENDOCRINE: No polyuria, nocturia,  HEMATOLOGY: No anemia, easy bruising or bleeding SKIN: No rash or lesion. MUSCULOSKELETAL: No joint pain or arthritis.   NEUROLOGIC: No tingling, numbness, weakness.  PSYCHIATRY: No anxiety or depression.   MEDICATIONS AT HOME:  Prior to Admission medications   Medication Sig Start Date End Date Taking? Authorizing Provider  albuterol (PROVENTIL HFA;VENTOLIN HFA) 108 (90 Base) MCG/ACT inhaler Inhale 2 puffs into the lungs every 6 (six) hours as needed for wheezing or shortness of breath. 09/14/15  Yes Ryan M Dunn, PA-C  aspirin EC 81 MG tablet Take 81 mg by mouth daily.   Yes Historical Provider, MD  calcium acetate (PHOSLO) 667 MG capsule Take 1 capsule by mouth 3 (three) times daily. 06/17/16  Yes Historical Provider, MD  cinacalcet (SENSIPAR) 30 MG tablet Take 1 tablet by mouth daily. 12/17/15  Yes Historical Provider, MD  ferrous sulfate 325 (65 FE) MG tablet Take 325 mg by mouth daily with breakfast.     Yes Historical Provider, MD  hydrocortisone valerate ointment (WESTCORT) 0.2 % Apply to affected area daily 06/20/16 06/20/17 Yes Joni Reiningonald K Smith, PA-C  hydrOXYzine (ATARAX) 10 MG/5ML syrup Take 5 mLs (10 mg total) by mouth 3 (three) times daily as needed for itching. 06/20/16  Yes Joni Reiningonald K Smith, PA-C  Insulin Detemir (LEVEMIR FLEXTOUCH) 100 UNIT/ML Pen Inject 22 Units into the skin daily before breakfast.   Yes Historical Provider, MD  lovastatin (MEVACOR) 20 MG tablet Take 20 mg by mouth at bedtime.   Yes Historical Provider, MD  Melatonin 5 MG TABS Take 1 tablet by mouth at bedtime.   Yes Historical Provider, MD  metoprolol succinate (TOPROL-XL) 50 MG 24 hr tablet Take 50 mg by mouth daily. Take with or immediately following a meal.   Yes Historical Provider, MD  sertraline (ZOLOFT) 25 MG tablet Take 25 mg by mouth daily.   Yes Historical Provider, MD  SPIRIVA HANDIHALER 18 MCG inhalation capsule Place 1 capsule into inhaler and inhale daily. 05/11/16  Yes  Historical Provider, MD  tamsulosin (FLOMAX) 0.4 MG CAPS capsule Take 1 capsule (0.4 mg total) by mouth daily. 09/04/15  Yes Ramonita Lab, MD  traZODone (DESYREL) 50 MG tablet Take 1 tablet by mouth at bedtime. 05/17/16  Yes Historical Provider, MD  losartan (COZAAR) 50 MG tablet Take 50 mg by mouth daily.    Historical Provider, MD      PHYSICAL EXAMINATION:   VITAL SIGNS: Blood pressure (!) 141/67, pulse 95, temperature 98 F (36.7 C), temperature source Oral, resp. rate 17, height 5\' 6"  (1.676 m), weight 75.8 kg (167 lb 1.6 oz), SpO2 100 %.  GENERAL:  82 y.o.-year-old patient lying in the bed in mild distress.  EYES: Pupils equal, round, reactive to light and accommodation. No scleral icterus. Extraocular muscles intact.  HEENT: Head atraumatic, normocephalic. Oropharynx and nasopharynx clear.  NECK:  Supple, no jugular venous distention. No thyroid enlargement, no tenderness.  LUNGS: Decreased breath sounds bilaterally, bibasilar  crepitations heard. No use of accessory muscles of respiration.  CARDIOVASCULAR: S1, S2 normal. No murmurs, rubs, or gallops.  ABDOMEN: Soft, nontender, nondistended. Bowel sounds present. No organomegaly or mass.  EXTREMITIES: No pedal edema, cyanosis, or clubbing.  NEUROLOGIC: Cranial nerves II through XII are intact. Muscle strength 5/5 in all extremities. Sensation intact. Gait not checked.  PSYCHIATRIC: The patient is alert and oriented x 3.  SKIN: No obvious rash, lesion, or ulcer.   LABORATORY PANEL:   CBC  Recent Labs Lab 07/14/16 2226  WBC 7.9  HGB 11.9*  HCT 37.1*  PLT 349  MCV 92.5  MCH 29.8  MCHC 32.2  RDW 18.4*   ------------------------------------------------------------------------------------------------------------------  Chemistries   Recent Labs Lab 07/14/16 2226  NA 137  K 5.5*  CL 100*  CO2 27  GLUCOSE 160*  BUN 56*  CREATININE 6.35*  CALCIUM 8.2*  AST 16  ALT 6*  ALKPHOS 61  BILITOT 0.7   ------------------------------------------------------------------------------------------------------------------ estimated creatinine clearance is 7.5 mL/min (by C-G formula based on SCr of 6.35 mg/dL (H)). ------------------------------------------------------------------------------------------------------------------ No results for input(s): TSH, T4TOTAL, T3FREE, THYROIDAB in the last 72 hours.  Invalid input(s): FREET3   Coagulation profile No results for input(s): INR, PROTIME in the last 168 hours. ------------------------------------------------------------------------------------------------------------------- No results for input(s): DDIMER in the last 72 hours. -------------------------------------------------------------------------------------------------------------------  Cardiac Enzymes  Recent Labs Lab 07/14/16 2226  TROPONINI 0.07*    ------------------------------------------------------------------------------------------------------------------ Invalid input(s): POCBNP  ---------------------------------------------------------------------------------------------------------------  Urinalysis    Component Value Date/Time   COLORURINE YELLOW (A) 08/28/2015 1311   APPEARANCEUR CLOUDY (A) 08/28/2015 1311   APPEARANCEUR Clear 02/22/2014 1410   LABSPEC 1.012 08/28/2015 1311   LABSPEC 1.016 02/22/2014 1410   PHURINE 5.0 08/28/2015 1311   GLUCOSEU 50 (A) 08/28/2015 1311   GLUCOSEU 150 mg/dL 09/81/1914 7829   HGBUR 3+ (A) 08/28/2015 1311   BILIRUBINUR NEGATIVE 08/28/2015 1311   BILIRUBINUR Negative 02/22/2014 1410   KETONESUR NEGATIVE 08/28/2015 1311   PROTEINUR 100 (A) 08/28/2015 1311   NITRITE NEGATIVE 08/28/2015 1311   LEUKOCYTESUR NEGATIVE 08/28/2015 1311   LEUKOCYTESUR Negative 02/22/2014 1410     RADIOLOGY: Dg Chest 2 View  Result Date: 07/14/2016 CLINICAL DATA:  Shortness of breath.  Dialysis patient. EXAM: CHEST  2 VIEW COMPARISON:  05/17/2016 FINDINGS: Postoperative changes in the mediastinum. Cardiac enlargement. Mild diffuse interstitial pattern to the lungs suggesting edema. Bilateral pleural effusions, greater on the left. Atelectasis in the lung bases. No pneumothorax. Calcification of the aorta. Degenerative changes in the spine and shoulders. IMPRESSION: Cardiac enlargement with mild interstitial pulmonary  edema. Bilateral pleural effusions with basilar atelectasis, greater on the left. Electronically Signed   By: Burman Nieves M.D.   On: 07/14/2016 22:56    EKG: Orders placed or performed during the hospital encounter of 07/14/16  . ED EKG  . ED EKG  . EKG 12-Lead  . EKG 12-Lead    IMPRESSION AND PLAN: 82 year old male patient with history of end-stage renal disease on dialysis, type 2 diabetes mellitus, coronary artery disease, CABG, GERD, anxiety disorder presented to the emergency  room with increased shortness of breath. Admitting diagnosis 1. Acute respiratory distress secondary to fluid overload 2. Pulmonary edema 3. Bilateral pleural effusions 4. End-stage renal disease on dialysis 5. Type 2 diabetes mellitus 6. Coronary artery disease Treatment plan Admit patient to medical floor DVT prophylaxis subcutaneous heparin Nephrology consultation for dialysis Medical management of diabetes mellitus Cycle troponin to rule out ischemia Resume home medications Oxygen via nasal cannula 2 L Supportive care.  All the records are reviewed and case discussed with ED provider. Management plans discussed with the patient, family and they are in agreement.  CODE STATUS:FULL CODE    Code Status Orders        Start     Ordered   07/15/16 0100  Full code  Continuous     07/15/16 0059    Code Status History    Date Active Date Inactive Code Status Order ID Comments User Context   03/22/2016  3:31 PM 03/23/2016 12:54 PM Full Code [id number]  Renford Dills, MD Inpatient   08/24/2015  4:21 PM 09/04/2015  8:59 PM Full Code [id number]  Enedina Finner, MD Inpatient    Advance Directive Documentation   Flowsheet Row Most Recent Value  Type of Advance Directive  Healthcare Power of Attorney, Living will  Pre-existing out of facility DNR order (yellow form or pink MOST form)  No data  "MOST" Form in Place?  No data       TOTAL TIME TAKING CARE OF THIS PATIENT: 55 minutes.    Ihor Austin M.D on 07/15/2016 at 2:35 AM  Between 7am to 6pm - Pager - [phone]  After 6pm go to [email or web address] - password [email or web address] [facility]  [place] [facility]  Office  [phone]  CC: Primary care physician; Barbette Reichmann, MD

## 2016-07-15 NOTE — Progress Notes (Signed)
Subjective:   Patient is known for practice from outpatient basis.  He is followed by Dr. Wynelle [name]  He presents for worsening shortness of breath since last night. His last dialysis was on Tuesday. Urgent dialysis was requested.  At present, he feels better. His outpatient dry weight is 75.5 kg.  admission weight 78 kg. currently requiring oxygen supplementation with nasal cannula Patient seen during dialysis Tolerating well   HEMODIALYSIS FLOWSHEET:  Blood Flow Rate (mL/min): 400 mL/min Arterial Pressure (mmHg): -160 mmHg Venous Pressure (mmHg): 230 mmHg Transmembrane Pressure (mmHg): 40 mmHg Ultrafiltration Rate (mL/min): 860 mL/min Dialysate Flow Rate (mL/min): 800 ml/min Conductivity: Machine : 14 Conductivity: Machine : 14 Dialysis Fluid Bolus: Normal Saline Bolus Amount (mL): 250 mL Intra-Hemodialysis Comments: 2136ml (resting with eyes closed)    Objective:  Vital signs in last 24 hours:  Temp:  [97.6 F (36.4 C)-98.2 F (36.8 C)] 97.8 F (36.6 C) (11/24 1010) Pulse Rate:  [73-102] 91 (11/24 1230) Resp:  [15-23] 19 (11/24 1230) BP: (107-141)/(56-115) 137/115 (11/24 1230) SpO2:  [97 %-100 %] 99 % (11/24 1030) Weight:  [74.8 kg (165 lb)-78 kg (172 lb)] 77.2 kg (170 lb 3.1 oz) (11/24 1010)  Weight change:  Filed Weights   07/15/16 0102 07/15/16 0617 07/15/16 1010  Weight: 75.8 kg (167 lb 1.6 oz) 78 kg (172 lb) 77.2 kg (170 lb 3.1 oz)    Intake/Output:    Intake/Output Summary (Last 24 hours) at 07/15/16 1253 Last data filed at 07/15/16 1000  Gross per 24 hour  Intake              246 ml  Output               50 ml  Net              196 ml     Physical Exam: General: No acute distress, laying in the bed  HEENT Anicteric, moist oral mucous membranes, nasal cannula oxygen  Neck supple  Pulm/lungs Mild basilar crackles bilaterally  CVS/Heart No rub  Abdomen:  Soft, nontender  Extremities: No pedal edema  Neurologic: alert, oriented  Skin: Warm: Dry   Access: AV fistula       Basic Metabolic Panel:   Recent Labs Lab 07/14/16 2226 07/15/16 0448  NA 137 137  K 5.5* 5.3*  CL 100* 102  CO2 27 25  GLUCOSE 160* 108*  BUN 56* 54*  CREATININE 6.35* 6.71*  CALCIUM 8.2* 7.9*  MG  --  2.1  PHOS  --  6.1*     CBC:  Recent Labs Lab 07/14/16 2226 07/15/16 0448  WBC 7.9 5.3  HGB 11.9* 10.6*  HCT 37.1* 32.5*  MCV 92.5 93.8  PLT 349 298      Microbiology:  Recent Results (from the past 720 hour(s))  MRSA PCR Screening     Status: None   Collection Time: 07/15/16  1:41 AM  Result Value Ref Range Status   MRSA by PCR NEGATIVE NEGATIVE Final    Comment:        The GeneXpert MRSA Assay (FDA approved for NASAL specimens only), is one component of a comprehensive MRSA colonization surveillance program. It is not intended to diagnose MRSA infection nor to guide or monitor treatment for MRSA infections.     Coagulation Studies: No results for input(s): LABPROT, INR in the last 72 hours.  Urinalysis: No results for input(s): COLORURINE, LABSPEC, PHURINE, GLUCOSEU, HGBUR, BILIRUBINUR, KETONESUR, PROTEINUR, UROBILINOGEN, NITRITE, LEUKOCYTESUR in the last 72  hours.  Invalid input(s): APPERANCEUR    Imaging: Dg Chest 2 View  Result Date: 07/14/2016 CLINICAL DATA:  Shortness of breath.  Dialysis patient. EXAM: CHEST  2 VIEW COMPARISON:  05/17/2016 FINDINGS: Postoperative changes in the mediastinum. Cardiac enlargement. Mild diffuse interstitial pattern to the lungs suggesting edema. Bilateral pleural effusions, greater on the left. Atelectasis in the lung bases. No pneumothorax. Calcification of the aorta. Degenerative changes in the spine and shoulders. IMPRESSION: Cardiac enlargement with mild interstitial pulmonary edema. Bilateral pleural effusions with basilar atelectasis, greater on the left. Electronically Signed   By: Kenneth [name] M.D.   On: 07/14/2016 22:56     Medications:    . aspirin EC  81 mg Oral  Daily  . calcium acetate  667 mg Oral TID WC  . cinacalcet  30 mg Oral Q breakfast  . ferrous sulfate  325 mg Oral Q breakfast  . heparin  5,000 Units Subcutaneous Q8H  . insulin detemir  22 Units Subcutaneous QAC breakfast  . losartan  50 mg Oral Daily  . Melatonin  1 tablet Oral QHS  . metoprolol succinate  50 mg Oral Daily  . pravastatin  20 mg Oral q1800  . sertraline  25 mg Oral Daily  . sodium chloride flush  3 mL Intravenous Q12H  . sodium chloride flush  3 mL Intravenous Q12H  . tamsulosin  0.4 mg Oral Daily  . tiotropium  1 capsule Inhalation Daily  . traZODone  50 mg Oral QHS   sodium chloride, acetaminophen **OR** acetaminophen, albuterol, bisacodyl, HYDROcodone-acetaminophen, hydrOXYzine, magnesium citrate, ondansetron **OR** ondansetron (ZOFRAN) IV, senna-docusate, sodium chloride flush, zolpidem  Assessment/ Plan:  82 y.o. male with end-stage kidney disease, dialysis,diabetes type 2, coronary disease, CABG, anemia of chronic kidney disease, anxiety disorder, GERD admitted for management of shortness of breath  [facility].  MWF-1. [facility]  1. End-stage renal disease 2. Volume overload Causing shortness of breath 3. Anemia of chronic kidney disease 4.  Secondary hyperparathyroidism  Plan: Urgent dialysis this morning Ultrafiltration goal of 2-3 kg as tolerated Phosphorus level high at 6.1.  Low phosphorus diet, sensipar, calcium acetate as phos binder Procrit with dialysis      LOS: 0 Kenneth [name] 11/24/201712:53 PM

## 2016-07-15 NOTE — Progress Notes (Signed)
Hd completed 

## 2016-07-15 NOTE — Progress Notes (Signed)
Post dialysis assessment 

## 2016-07-15 NOTE — Care Management Important Message (Signed)
Important Message  Patient Details  Name: Kenneth [name] T Gilroy MRN: [id number] Date of Birth: [birth year]   Medicare Important Message Given:  Yes    Chen Saadeh A, RN 07/15/2016, 7:53 AM

## 2016-07-28 ENCOUNTER — Other Ambulatory Visit: Payer: Self-pay | Admitting: Nephrology

## 2016-07-28 DIAGNOSIS — R0602 Shortness of breath: Secondary | ICD-10-CM

## 2016-07-29 ENCOUNTER — Ambulatory Visit
Admission: RE | Admit: 2016-07-29 | Discharge: 2016-07-29 | Disposition: A | Discharge status: Home / Self Care | Payer: Medicare Other | Source: Ambulatory Visit | Attending: Nephrology | Admitting: Nephrology

## 2016-07-29 DIAGNOSIS — J90 Pleural effusion, not elsewhere classified: Secondary | ICD-10-CM | POA: Insufficient documentation

## 2016-07-29 DIAGNOSIS — R0602 Shortness of breath: Secondary | ICD-10-CM | POA: Diagnosis not present

## 2016-08-09 ENCOUNTER — Emergency Department: Payer: Medicare Other

## 2016-08-09 ENCOUNTER — Encounter: Payer: Self-pay | Admitting: Intensive Care

## 2016-08-09 ENCOUNTER — Emergency Department
Admission: EM | Admit: 2016-08-09 | Discharge: 2016-08-09 | Disposition: A | Discharge status: Home / Self Care | Payer: Medicare Other | Attending: Emergency Medicine | Admitting: Emergency Medicine

## 2016-08-09 DIAGNOSIS — E1122 Type 2 diabetes mellitus with diabetic chronic kidney disease: Secondary | ICD-10-CM | POA: Diagnosis not present

## 2016-08-09 DIAGNOSIS — Z79899 Other long term (current) drug therapy: Secondary | ICD-10-CM | POA: Diagnosis not present

## 2016-08-09 DIAGNOSIS — I120 Hypertensive chronic kidney disease with stage 5 chronic kidney disease or end stage renal disease: Secondary | ICD-10-CM | POA: Insufficient documentation

## 2016-08-09 DIAGNOSIS — I2510 Atherosclerotic heart disease of native coronary artery without angina pectoris: Secondary | ICD-10-CM | POA: Diagnosis not present

## 2016-08-09 DIAGNOSIS — Z87891 Personal history of nicotine dependence: Secondary | ICD-10-CM | POA: Diagnosis not present

## 2016-08-09 DIAGNOSIS — N186 End stage renal disease: Secondary | ICD-10-CM | POA: Diagnosis not present

## 2016-08-09 DIAGNOSIS — R0602 Shortness of breath: Secondary | ICD-10-CM | POA: Diagnosis not present

## 2016-08-09 DIAGNOSIS — Z7982 Long term (current) use of aspirin: Secondary | ICD-10-CM | POA: Insufficient documentation

## 2016-08-09 DIAGNOSIS — R072 Precordial pain: Secondary | ICD-10-CM | POA: Insufficient documentation

## 2016-08-09 DIAGNOSIS — Z794 Long term (current) use of insulin: Secondary | ICD-10-CM | POA: Insufficient documentation

## 2016-08-09 LAB — CBC WITH DIFFERENTIAL/PLATELET
BASOS PCT: 1 %
Basophils Absolute: 0.1 10*3/uL (ref 0–0.1)
Eosinophils Absolute: 0.1 10*3/uL (ref 0–0.7)
Eosinophils Relative: 2 %
HEMATOCRIT: 37.2 % — AB (ref 40.0–52.0)
HEMOGLOBIN: 12 g/dL — AB (ref 13.0–18.0)
LYMPHS ABS: 0.9 10*3/uL — AB (ref 1.0–3.6)
Lymphocytes Relative: 16 %
MCH: 30 pg (ref 26.0–34.0)
MCHC: 32.2 g/dL (ref 32.0–36.0)
MCV: 93.2 fL (ref 80.0–100.0)
MONO ABS: 0.5 10*3/uL (ref 0.2–1.0)
MONOS PCT: 9 %
NEUTROS ABS: 4.2 10*3/uL (ref 1.4–6.5)
Neutrophils Relative %: 72 %
Platelets: 317 10*3/uL (ref 150–440)
RBC: 3.99 MIL/uL — ABNORMAL LOW (ref 4.40–5.90)
RDW: 18.4 % — AB (ref 11.5–14.5)
WBC: 5.8 10*3/uL (ref 3.8–10.6)

## 2016-08-09 LAB — BASIC METABOLIC PANEL
Anion gap: 8 (ref 5–15)
BUN: 32 mg/dL — AB (ref 6–20)
CALCIUM: 8.4 mg/dL — AB (ref 8.9–10.3)
CO2: 29 mmol/L (ref 22–32)
Chloride: 97 mmol/L — ABNORMAL LOW (ref 101–111)
Creatinine, Ser: 4.35 mg/dL — ABNORMAL HIGH (ref 0.61–1.24)
GFR calc Af Amer: 13 mL/min — ABNORMAL LOW (ref >60)
GFR, EST NON AFRICAN AMERICAN: 11 mL/min — AB (ref >60)
GLUCOSE: 94 mg/dL (ref 65–99)
Potassium: 6.1 mmol/L — ABNORMAL HIGH (ref 3.5–5.1)
Sodium: 134 mmol/L — ABNORMAL LOW (ref 135–145)

## 2016-08-09 LAB — TROPONIN I
TROPONIN I: 0.07 ng/mL — AB (ref <0.03)
Troponin I: 0.08 ng/mL (ref <0.03)

## 2016-08-09 LAB — BRAIN NATRIURETIC PEPTIDE: B Natriuretic Peptide: 1395 pg/mL — ABNORMAL HIGH (ref 0.0–100.0)

## 2016-08-09 NOTE — ED Triage Notes (Signed)
PAtient arrived by EMS for c/o dull chest pain at 0600. Patient called EMS and when they arrived he is now experiencing chest tightness with no pain. Left arm shunt for dialysis present. EMS vitals HR 80s and blood sugar 117. HX COPD, HTN, End stage renal failure. Patient wear 2L O2 continuous at home. FD reported O2 of 87% upon arrival. Patient is A&O x4

## 2016-08-09 NOTE — Discharge Instructions (Addendum)
Please follow-up with your cardiologist.. The office should call you to set up a follow up appointment ASAP. Please make sure you get your dialysis tomorrow. Please Return for any further problems.

## 2016-08-09 NOTE — ED Provider Notes (Signed)
[facility] Emergency Department Provider Note   ____________________________________________   First MD Initiated Contact with Patient 08/09/16 [phone]     (approximate)  I have reviewed the triage vital signs and the nursing notes.   HISTORY  Chief Complaint Chest Pain    HPI Kenneth [name] is a 82 y.o. male patient reports dull achy chest pain starting about 6:00 this morning. Started on the left side and moved across the whole chest. It is now much better. Patient reports she had some nausea and shortness of breath with it. He did not have any sweating and did not radiate anywhere else. She reports he had stents. He was told he had a heart attack but never remembered any chest discomfort. Patient reports he took 5 baby aspirin at home today.   Past Medical History:  Diagnosis Date  . Anemia   . Anxiety   . Arthritis   . B12 deficiency   . Coronary artery disease    a. 2vCABG LIMA-LAD, SVG-OM1 in 1999; b. Lexiscan myoview (5/10) showed EF 45%, HK apex & lateral wall, fixed perfusion defect involving apex, inferoapex, & inferolateral wall, also some inferoapical ischemia; LHC (5/10) showed patent LIMA-LAD and totally occluded SVG-OM1, 99% pLAD, 40% ostial CFX, 40% mCFX, 30% OM1, 20% pRCA  . Diabetes mellitus   . ESRD on hemodialysis (HCC)    a. once weekly as of 08/2015  . GERD (gastroesophageal reflux disease)   . History of MRI of chest 11/10   a. Cardiac; EF 48%, mid anterolateral and inferolateral walls, apical lateral wall, apical septal wall, and true apex were all think and akinetic; 51-75% thickness subendocardial delayed enhancement in mid anterolateral and inferolateral walls, full thickness enhancement in apical laterla and apical septal walls and true apex  . Hyperlipidemia   . Hypertension   . Ischemic cardiomyopathy    a. likely ICM, EF 55-60%, RWMA could not be excluded, nl PASP, nl CVP  . Nephrolithiasis   . Obesity   . Occasional  tremors   . OSA (obstructive sleep apnea)    Moderate CPAP  . Shortness of breath dyspnea   . Spinal stenosis     Patient Active Problem List   Diagnosis Date Noted  . Pulmonary edema 07/15/2016  . Anemia, unspecified 05/20/2016  . ESRD on dialysis (HCC) 09/28/2015  . Ischemic cardiomyopathy   . CKD (chronic kidney disease) stage 4, GFR 15-29 ml/min (HCC) 08/26/2015  . Sinus tachycardia 08/26/2015  . Demand ischemia (HCC)   . Coronary artery disease involving coronary bypass graft of native heart without angina pectoris   . Ischemic chest pain (HCC)   . Acute non-ST elevation myocardial infarction (NSTEMI) (HCC) 08/24/2015  . NSTEMI (non-ST elevated myocardial infarction) (HCC)   . Angina pectoris (HCC)   . SOB (shortness of breath)   . Chronic diastolic CHF (congestive heart failure) (HCC)   . Centrilobular emphysema (HCC)   . Coronary artery disease involving coronary bypass graft of native heart with angina pectoris with documented spasm (HCC)   . Hyperlipidemia   . ED (erectile dysfunction) 10/06/2014  . Type 2 diabetes mellitus with chronic kidney disease (HCC) 05/26/2014  . Personal history of other malignant neoplasm of skin 03/19/2012  . OSA on CPAP 08/19/2009  . DYSPNEA 07/17/2009  . SNORING 07/17/2009  . HYPERLIPIDEMIA-MIXED 07/02/2009  . HYPERTENSION, UNSPECIFIED 07/02/2009  . CORONARY ATHEROSCLEROSIS OF ARTERY BYPASS GRAFT 07/02/2009  . CARDIOMYOPATHY, ISCHEMIC 07/02/2009    Past Surgical History:  Procedure  Laterality Date  . BACK SURGERY     Multiple, with spinal stenosis  . BASCILIC VEIN TRANSPOSITION Left 12/11/2015   Procedure: BASCILIC VEIN TRANSPOSITION ( STAGE 1 );  Surgeon: Renford Dills, MD;  Location: [facility] ORS;  Service: Vascular;  Laterality: Left;  . CARDIAC CATHETERIZATION    . CATARACT EXTRACTION    . CORONARY ARTERY BYPASS GRAFT  1999   In [place] with LIMA-LAD and SVG-OM1  . EYE SURGERY Bilateral    Cataract extraction with IOL  .  INSERTION OF DIALYSIS CATHETER Right    Dr. Wyn Quaker, [facility]  . JOINT REPLACEMENT Left    Total Knee Replacement  . PERIPHERAL VASCULAR CATHETERIZATION N/A 09/01/2015   Procedure: Dialysis/Perma Catheter Insertion;  Surgeon: Renford Dills, MD;  Location: [facility] INVASIVE CV LAB;  Service: Cardiovascular;  Laterality: N/A;  . PERIPHERAL VASCULAR CATHETERIZATION Left 02/25/2016   Procedure: Dialysis/Perma Catheter Removal;  Surgeon: Annice Needy, MD;  Location: [facility] INVASIVE CV LAB;  Service: Cardiovascular;  Laterality: Left;  . PERIPHERAL VASCULAR CATHETERIZATION Left 03/22/2016   Procedure: A/V Shuntogram/Fistulagram;  Surgeon: Renford Dills, MD;  Location: [facility] INVASIVE CV LAB;  Service: Cardiovascular;  Laterality: Left;  . TOTAL KNEE ARTHROPLASTY  5/10    Prior to Admission medications   Medication Sig Start Date End Date Taking? Authorizing Provider  albuterol (PROVENTIL HFA;VENTOLIN HFA) 108 (90 Base) MCG/ACT inhaler Inhale 2 puffs into the lungs every 6 (six) hours as needed for wheezing or shortness of breath. 09/14/15  Yes Ryan M Dunn, PA-C  aspirin EC 81 MG tablet Take 81 mg by mouth daily.   Yes Historical Provider, MD  calcium acetate (PHOSLO) 667 MG capsule Take 1 capsule by mouth 3 (three) times daily. 06/17/16  Yes Historical Provider, MD  Insulin Detemir (LEVEMIR FLEXTOUCH) 100 UNIT/ML Pen Inject 15 Units into the skin daily before breakfast.    Yes Historical Provider, MD  Ipratropium-Albuterol (COMBIVENT RESPIMAT) 20-100 MCG/ACT AERS respimat Inhale 1 puff into the lungs daily.   Yes Historical Provider, MD  losartan (COZAAR) 50 MG tablet Take 50 mg by mouth daily.   Yes Historical Provider, MD  lovastatin (MEVACOR) 20 MG tablet Take 20 mg by mouth at bedtime.   Yes Historical Provider, MD  Melatonin 5 MG TABS Take 1 tablet by mouth at bedtime.   Yes Historical Provider, MD  metoprolol succinate (TOPROL-XL) 50 MG 24 hr tablet Take 50 mg by mouth daily. Take with or immediately  following a meal.   Yes Historical Provider, MD  sertraline (ZOLOFT) 25 MG tablet Take 25 mg by mouth daily.   Yes Historical Provider, MD  tamsulosin (FLOMAX) 0.4 MG CAPS capsule Take 1 capsule (0.4 mg total) by mouth daily. 09/04/15  Yes Ramonita Lab, MD  cinacalcet (SENSIPAR) 30 MG tablet Take 1 tablet by mouth daily. 12/17/15   Historical Provider, MD  ferrous sulfate 325 (65 FE) MG tablet Take 325 mg by mouth daily with breakfast.     Historical Provider, MD  hydrocortisone valerate ointment (WESTCORT) 0.2 % Apply to affected area daily Patient not taking: Reported on 08/09/2016 06/20/16 06/20/17  Joni Reining, PA-C  hydrOXYzine (ATARAX) 10 MG/5ML syrup Take 5 mLs (10 mg total) by mouth 3 (three) times daily as needed for itching. Patient not taking: Reported on 08/09/2016 06/20/16   Joni Reining, PA-C  SPIRIVA HANDIHALER 18 MCG inhalation capsule Place 1 capsule into inhaler and inhale daily. 05/11/16   Historical Provider, MD  traZODone (DESYREL) 50 MG  tablet Take 1 tablet by mouth at bedtime. 05/17/16   Historical Provider, MD    Allergies Bee venom; Beef-derived products; Iodine; and Ivp dye [iodinated diagnostic agents]  Family History  Problem Relation Age of Onset  . Kidney failure Mother   . Heart failure Father     Social History Social History  Substance Use Topics  . Smoking status: Former Smoker    Types: Cigarettes  . Smokeless tobacco: Never Used  . Alcohol use No    Review of Systems Constitutional: No fever/chills Eyes: No visual changes. ENT: No sore throat. Cardiovascular: chest pain. Respiratory:  shortness of breath. Gastrointestinal: No abdominal pain.  No nausea, no vomiting.  No diarrhea.  No constipation. Genitourinary: Negative for dysuria. Musculoskeletal: Negative for back pain. Skin: Negative for rash. Neurological: Negative for headaches, focal weakness or numbness.  10-point ROS otherwise  negative.  ____________________________________________   PHYSICAL EXAM:  VITAL SIGNS: ED Triage Vitals  Enc Vitals Group     BP 08/09/16 0919 136/67     Pulse Rate 08/09/16 0919 91     Resp 08/09/16 0919 19     Temp 08/09/16 0919 98.1 F (36.7 C)     Temp Source 08/09/16 0919 Oral     SpO2 08/09/16 0919 100 %     Weight 08/09/16 0920 165 lb (74.8 kg)     Height 08/09/16 0920 5\' 6"  (1.676 m)     Head Circumference --      Peak Flow --      Pain Score --      Pain Loc --      Pain Edu? --      Excl. in GC? --     Constitutional: Alert and oriented. Well appearing and in no acute distress. Eyes: Conjunctivae are normal. PERRL. EOMI. Head: Atraumatic. Nose: No congestion/rhinnorhea. Mouth/Throat: Mucous membranes are moist.  Oropharynx non-erythematous. Neck: No stridor. Cardiovascular: Normal rate, regular rhythm. Grossly normal heart sounds.  Good peripheral circulation. Respiratory: Normal respiratory effort.  No retractions. Lungs CTAB. Gastrointestinal: Soft and nontender. No distention. No abdominal bruits. No CVA tenderness. }Musculoskeletal: No lower extremity tenderness nor edema.  No joint effusions. Neurologic:  Normal speech and language. No gross focal neurologic deficits are appreciated. No gait instability. Skin:  Skin is warm, dry and intact. No rash noted. Psychiatric: Mood and affect are normal. Speech and behavior are normal. Patient is O2 started at 100% he sat up to 6 and deep breaths and lay back down and went down to 87% even on 2 L of oxygen. As soon as he was back laying down the O2 sats went up very rapidly 200% again. ____________________________________________   LABS (all labs ordered are listed, but only abnormal results are displayed)  Labs Reviewed  BASIC METABOLIC PANEL - Abnormal; Notable for the following:       Result Value   Sodium 134 (*)    Potassium 6.1 (*)    Chloride 97 (*)    BUN 32 (*)    Creatinine, Ser 4.35 (*)    Calcium  8.4 (*)    GFR calc non Af Amer 11 (*)    GFR calc Af Amer 13 (*)    All other components within normal limits  TROPONIN I - Abnormal; Notable for the following:    Troponin I 0.08 (*)    All other components within normal limits  CBC WITH DIFFERENTIAL/PLATELET - Abnormal; Notable for the following:    RBC 3.99 (*)  Hemoglobin 12.0 (*)    HCT 37.2 (*)    RDW 18.4 (*)    Lymphs Abs 0.9 (*)    All other components within normal limits  BRAIN NATRIURETIC PEPTIDE - Abnormal; Notable for the following:    B Natriuretic Peptide 1,395.0 (*)    All other components within normal limits  TROPONIN I - Abnormal; Notable for the following:    Troponin I 0.07 (*)    All other components within normal limits   ____________________________________________  EKG  EKG read and interpreted by me shows sinus rhythm rate of 88 normal axis patient has minimally flipped T waves in lead 2 and 3 and looks like also in aVF which are new from this past November. ____________________________________________  RADIOLOGY Study Result   CLINICAL DATA:  Chest tightness and pain  EXAM: CHEST  2 VIEW  COMPARISON:  July 29, 2016  FINDINGS: There is loculated effusion on the left with associated left lower lobe consolidation. There is a much smaller right pleural effusion with right base atelectasis. Heart is borderline enlarged with pulmonary venous hypertension. Patient is status post coronary artery bypass grafting. No adenopathy. There is degenerative change in the thoracic spine.  IMPRESSION: Bilateral pleural effusions, much larger on the left than on the right. Effusion on the left appears loculated. There is patchy consolidation left lower lobe with right base atelectasis. Stable cardiomegaly with mild pulmonary venous hypertension. There is felt to be a degree of congestive heart failure present. The appearance is stable compared to most recent prior  examination.   Electronically Signed   By: Bretta BangWilliam  Woodruff III M.D.   On: 08/09/2016 09:57   Chest x-ray has been stable since before December 8. ____________________________________________   PROCEDURES  Procedure(s) performed:  Procedures  Critical Care performed:   ____________________________________________   INITIAL IMPRESSION / ASSESSMENT AND PLAN / ED COURSE  Pertinent labs & imaging results that were available during my care of the patient were reviewed by me and considered in my medical decision making (see chart for details).   Clinical Course     Patient's pain resolves in the ER. His troponins are dropping and are lower than they had been previously. His BNP is elevated but has been elevated before his chest x-ray is normal his O2 sats are good on his 2 L now. He feels in no distress I [facility] they will call and arrange follow-up ASAP.   FINAL CLINICAL IMPRESSION(S) / ED DIAGNOSES  Final diagnoses:  Precordial pain      NEW MEDICATIONS STARTED DURING THIS VISIT:  New Prescriptions   No medications on file     Note:  This document was prepared using Dragon voice recognition software and may include unintentional dictation errors.    Arnaldo NatalPaul F Malinda, MD 08/09/16 [phone]

## 2016-08-17 ENCOUNTER — Ambulatory Visit (INDEPENDENT_AMBULATORY_CARE_PROVIDER_SITE_OTHER): Payer: Medicare Other | Admitting: Cardiovascular Disease

## 2016-08-17 ENCOUNTER — Encounter: Payer: Self-pay | Admitting: Cardiovascular Disease

## 2016-08-17 VITALS — BP 126/60 | HR 102 | Ht 66.0 in | Wt 166.5 lb

## 2016-08-17 DIAGNOSIS — I255 Ischemic cardiomyopathy: Secondary | ICD-10-CM | POA: Diagnosis not present

## 2016-08-17 DIAGNOSIS — Z992 Dependence on renal dialysis: Secondary | ICD-10-CM

## 2016-08-17 DIAGNOSIS — I25810 Atherosclerosis of coronary artery bypass graft(s) without angina pectoris: Secondary | ICD-10-CM | POA: Diagnosis not present

## 2016-08-17 DIAGNOSIS — N184 Chronic kidney disease, stage 4 (severe): Secondary | ICD-10-CM

## 2016-08-17 DIAGNOSIS — I200 Unstable angina: Secondary | ICD-10-CM

## 2016-08-17 DIAGNOSIS — E1122 Type 2 diabetes mellitus with diabetic chronic kidney disease: Secondary | ICD-10-CM

## 2016-08-17 DIAGNOSIS — I209 Angina pectoris, unspecified: Secondary | ICD-10-CM | POA: Diagnosis not present

## 2016-08-17 DIAGNOSIS — N186 End stage renal disease: Secondary | ICD-10-CM

## 2016-08-17 DIAGNOSIS — R0602 Shortness of breath: Secondary | ICD-10-CM | POA: Diagnosis not present

## 2016-08-17 DIAGNOSIS — Z794 Long term (current) use of insulin: Secondary | ICD-10-CM

## 2016-08-17 MED ORDER — NITROGLYCERIN 0.4 MG SL SUBL: 0.4000 mg | SUBLINGUAL_TABLET | SUBLINGUAL | 3 refills | Status: AC | PRN

## 2016-08-17 NOTE — Progress Notes (Signed)
Cardiology Office Note  Date:  08/17/2016   ID:  Kenneth [name], DOB 03/06/1928, MRN [id number]  PCP:  Kenneth Reichmann, MD   Chief Complaint  Patient presents with  . other    ED Fu for precordial pain on 12/19. Pt c/o chest tightness and SOB. Pt states he goes to dailysis 3xweek. Reviewed meds with pt verbally.    HPI:  Kenneth [name] is a 82 y.o. male with history of CAD s/p CABG, occluded vein graft to an OM, patent LIMA to the LAD, COPD on chronic oxygen 2 L with long smoking history, ESRD on HD On Monday, Wednesday and Friday , OSA, and HTN who presents for routine follow-up of his diastolic CHF/hypervolemia secondary to end-stage renal disease Recent hospitalization November 2017 requiring thoracentesis, Extra hemodialysis   followed by nephrology : Dr.  Ronn Melena   in follow-up today he reports developing left chest pain early in the morning on 08/09/2016 . He called EMTs, symptoms resolved by the time he got to the emergency room . Cardiac enzymes negative 2  Long discussion concerning his previous anginal symptoms before bypass surgery . At that time did not develop chest pain   Long discussion concerning his fluid status  Extra 15 minutes added to his hemodialysis since his hospitalization November 2017    previous admission to [facility] form 1/2-1/13 for AMS 2/2 metabolic encephalopathy and bronchitis,  Sometimes with cramps  in the evening and after excessive hemodialysis   releases dry weight is close to 74 kg  EKG on today's visit shows normal sinus rhythm with rate 102 bpm, no specific T-wave abnormality   other past medical history reviewed  Patient underwent cardiac bypass in 1999 with LIMA-LAD and SVG-OM1.  Symptoms at that time were fatigue and SOB.    scheduled for knee replacement surgery in May of 2010.   Lexiscan stress test by outside cardiology group on 12/23/2008 that showed moderate fixed inferoapical, apical and mild fixed posterolateral myocardial  perfusion defect c/w previous infarct and/or scar with small reversible inferoapical perfusion defect c/w myocardial ischemia. EF 45%.  cardiac cath on 01/06/2009 that showed an occluded SVG-OM1 and patent LIMA-LAD, mild anterior wall HK.   cardiac MRI on 11/18/210 that showed an EF of 48% and an akinetic mid inferolateral and mid anterolateral wall, as well as the apex. There was 50% thickness subendocardial delayed enhancement in those walls which likely corresponds to the LCx distribution.   lost to follow up for approximately 5 years.    . Anemia   . Anxiety   . Arthritis   . B12 deficiency   . Coronary artery disease    a. 2vCABG LIMA-LAD, SVG-OM1 in 1999; b. Lexiscan myoview (5/10) showed EF 45%, HK apex & lateral wall, fixed perfusion defect involving apex, inferoapex, & inferolateral wall, also some inferoapical ischemia; LHC (5/10) showed patent LIMA-LAD and totally occluded SVG-OM1, 99% pLAD, 40% ostial CFX, 40% mCFX, 30% OM1, 20% pRCA  . Diabetes mellitus   . ESRD on hemodialysis (HCC)    a. once weekly as of 08/2015  . GERD (gastroesophageal reflux disease)   . History of MRI of chest 11/10   a. Cardiac; EF 48%, mid anterolateral and inferolateral walls, apical lateral wall, apical septal wall, and true apex were all think and akinetic; 51-75% thickness subendocardial delayed enhancement in mid anterolateral and inferolateral walls, full thickness enhancement in apical laterla and apical septal walls and true apex  . Hyperlipidemia   . Hypertension   .  Ischemic cardiomyopathy    a. likely ICM, EF 55-60%, RWMA could not be excluded, nl PASP, nl CVP  . Nephrolithiasis   . Obesity   . Occasional tremors   . OSA (obstructive sleep apnea)    Moderate CPAP  . Shortness of breath dyspnea   . Spinal stenosis      PMH:   has a past medical history of Anemia; Anxiety; Arthritis; B12 deficiency; Coronary artery disease; Diabetes mellitus; ESRD on hemodialysis  (HCC); GERD (gastroesophageal reflux disease); History of MRI of chest (11/10); Hyperlipidemia; Hypertension; Ischemic cardiomyopathy; Nephrolithiasis; Obesity; Occasional tremors; OSA (obstructive sleep apnea); Shortness of breath dyspnea; and Spinal stenosis.  PSH:    Past Surgical History:  Procedure Laterality Date  . BACK SURGERY     Multiple, with spinal stenosis  . BASCILIC VEIN TRANSPOSITION Left 12/11/2015   Procedure: BASCILIC VEIN TRANSPOSITION ( STAGE 1 );  Surgeon: Renford Dills, MD;  Location: [facility] ORS;  Service: Vascular;  Laterality: Left;  . CARDIAC CATHETERIZATION    . CATARACT EXTRACTION    . CORONARY ARTERY BYPASS GRAFT  1999   In [place] with LIMA-LAD and SVG-OM1  . EYE SURGERY Bilateral    Cataract extraction with IOL  . INSERTION OF DIALYSIS CATHETER Right    Dr. Wyn Quaker, [facility]  . JOINT REPLACEMENT Left    Total Knee Replacement  . PERIPHERAL VASCULAR CATHETERIZATION N/A 09/01/2015   Procedure: Dialysis/Perma Catheter Insertion;  Surgeon: Renford Dills, MD;  Location: [facility] INVASIVE CV LAB;  Service: Cardiovascular;  Laterality: N/A;  . PERIPHERAL VASCULAR CATHETERIZATION Left 02/25/2016   Procedure: Dialysis/Perma Catheter Removal;  Surgeon: Annice Needy, MD;  Location: [facility] INVASIVE CV LAB;  Service: Cardiovascular;  Laterality: Left;  . PERIPHERAL VASCULAR CATHETERIZATION Left 03/22/2016   Procedure: A/V Shuntogram/Fistulagram;  Surgeon: Renford Dills, MD;  Location: [facility] INVASIVE CV LAB;  Service: Cardiovascular;  Laterality: Left;  . TOTAL KNEE ARTHROPLASTY  5/10    Current Outpatient Prescriptions  Medication Sig Dispense Refill  . albuterol (PROVENTIL HFA;VENTOLIN HFA) 108 (90 Base) MCG/ACT inhaler Inhale 2 puffs into the lungs every 6 (six) hours as needed for wheezing or shortness of breath. 1 Inhaler 6  . aspirin EC 81 MG tablet Take 81 mg by mouth daily.    . calcium acetate (PHOSLO) 667 MG capsule Take 1 capsule by mouth 3 (three) times daily.     . cinacalcet (SENSIPAR) 30 MG tablet Take 1 tablet by mouth daily.    . ferrous sulfate 325 (65 FE) MG tablet Take 325 mg by mouth daily with breakfast.     . hydrocortisone valerate ointment (WESTCORT) 0.2 % Apply to affected area daily 45 g 1  . hydrOXYzine (ATARAX) 10 MG/5ML syrup Take 5 mLs (10 mg total) by mouth 3 (three) times daily as needed for itching. 118 mL 0  . Insulin Detemir (LEVEMIR FLEXTOUCH) 100 UNIT/ML Pen Inject 15 Units into the skin daily before breakfast.     . Ipratropium-Albuterol (COMBIVENT RESPIMAT) 20-100 MCG/ACT AERS respimat Inhale 1 puff into the lungs daily.    Marland Kitchen losartan (COZAAR) 50 MG tablet Take 50 mg by mouth daily.    Marland Kitchen lovastatin (MEVACOR) 20 MG tablet Take 20 mg by mouth at bedtime.    . Melatonin 5 MG TABS Take 1 tablet by mouth at bedtime.    . metoprolol succinate (TOPROL-XL) 50 MG 24 hr tablet Take 50 mg by mouth daily. Take with or immediately following a meal.    . SPIRIVA  HANDIHALER 18 MCG inhalation capsule Place 1 capsule into inhaler and inhale daily.    . tamsulosin (FLOMAX) 0.4 MG CAPS capsule Take 1 capsule (0.4 mg total) by mouth daily. 30 capsule 0  . traZODone (DESYREL) 50 MG tablet Take 1 tablet by mouth at bedtime.    . nitroGLYCERIN (NITROSTAT) 0.4 MG SL tablet Place 1 tablet (0.4 mg total) under the tongue every 5 (five) minutes as needed for chest pain. 25 tablet 3   No current facility-administered medications for this visit.      Allergies:   Bee venom; Beef-derived products; Iodine; and Ivp dye [iodinated diagnostic agents]   Social History:  The patient  reports that he has quit smoking. His smoking use included Cigarettes. He has never used smokeless tobacco. He reports that he does not drink alcohol or use drugs.   Family History:   family history includes Heart failure in his father; Kidney failure in his mother.    Review of Systems: Review of Systems  Constitutional: Negative.   Respiratory: Positive for shortness of  breath.   Cardiovascular: Positive for chest pain.  Gastrointestinal: Negative.   Musculoskeletal: Negative.   Neurological: Negative.   Psychiatric/Behavioral: Negative.   All other systems reviewed and are negative.    PHYSICAL EXAM: VS:  BP 126/60 (BP Location: Right Arm, Patient Position: Sitting, Cuff Size: Normal)   Pulse (!) 102   Ht 5\' 6"  (1.676 m)   Wt 166 lb 8 oz (75.5 kg)   BMI 26.87 kg/m  , BMI Body mass index is 26.87 kg/m. GEN: Well nourished, well developed, in no acute distress  HEENT: normal  Neck: no JVD, carotid bruits, or masses Cardiac: RRR; no murmurs, rubs, or gallops,no edema , unable to appreciate lower extremity arterial pulses DP, PT Respiratory:  Decreased breath sounds throughout, rales at bases ,  normal work of breathing GI: soft, nontender, nondistended, + BS MS: no deformity or atrophy  Skin: warm and dry, no rash Neuro:  Strength and sensation are intact Psych: euthymic mood, full affect   Recent Labs: 08/26/2015: TSH 1.190 07/14/2016: ALT 6 07/15/2016: Magnesium 2.1 08/09/2016: B Natriuretic Peptide 1,395.0; BUN 32; Creatinine, Ser 4.35; Hemoglobin 12.0; Platelets 317; Potassium 6.1; Sodium 134    Lipid Panel Lab Results  Component Value Date   CHOL 113 08/24/2015   HDL 52 08/24/2015   LDLCALC 45 08/24/2015   TRIG 81 08/24/2015      Wt Readings from Last 3 Encounters:  08/17/16 166 lb 8 oz (75.5 kg)  08/09/16 165 lb (74.8 kg)  07/15/16 170 lb 3.1 oz (77.2 kg)       ASSESSMENT AND PLAN:  Angina pectoris (HCC) - Plan: EKG 12-Lead, NM Myocar Multi W/Spect W/Wall Motion / EF Recent episode of chest pain, ruled out in the ER Discussed first treatment options with him He prefers to avoid cardiac catheterization He is willing to schedule pharmacologic Myoview. He is unable to treadmill given leg weakness, shortness of breath, COPD Risk and benefit of procedure discussed with him. This will be scheduled for tomorrow Instructions  provided Recommended he take nitroglycerin as needed for further episodes of chest pain  Atherosclerosis of coronary artery bypass graft of native heart without angina pectoris - Plan: NM Myocar Multi W/Spect W/Wall Motion / EF Known severe coronary artery disease, patent LIMA to the LAD on prior cardiac catheterization with occluded vein graft to the OM  SOB (shortness of breath) - Plan: NM Myocar Multi W/Spect W/Wall Motion /  EF Recent episode of shortness of breath in the setting of chest pain consistent with angina. Stress test scheduled as above  Type 2 diabetes mellitus with stage 4 chronic kidney disease, with long-term current use of insulin (HCC) We have encouraged continued careful diet management in an effort to lose weight.  ESRD on dialysis [facility](HCC) Long discussion concerning his fluid status Recent hospitalization for fluid overload requiring longer hemodialysis, thoracentesis Suggested he talk with Dr. Ronn MelenaKolloru whether he might benefit from diuretics over three-day weekends/holidays For example he has hemodialysis on Sunday before new years, next HD  on Wednesday. Some dietary indiscretion, recommended he decrease his fluids over three-day stretches also avoid foods high in potassium such as oranges  Unstable angina pectoris (HCC) As above, stress test ordered, nitroglycerin as needed   Total encounter time more than 40 minutes  Greater than 50% was spent in counseling and coordination of care with the patient   Disposition:   F/U  6 months   Orders Placed This Encounter  Procedures  . NM Myocar Multi W/Spect W/Wall Motion / EF  . EKG 12-Lead     Signed, Dossie Arbourim Lendell Gallick, M.D., Ph.D. 08/17/2016  [facility], [place] [phone]

## 2016-08-17 NOTE — Patient Instructions (Addendum)
Medication Instructions:   Please take nitro under the tongue as needed for chest pain  Ask Dr. Ronn MelenaKolloru about diuretics (metolazone or torsemide) for the 3 days stretches between dialysis   Labwork:  No new labs needed  Testing/Procedures:  We will schedule a stress myoview, pharmacological  For chest pain, known CAD, hx of CABG  [facility] MYOVIEW  Your caregiver has ordered a Stress Test with nuclear imaging. The purpose of this test is to evaluate the blood supply to your heart muscle. This procedure is referred to as a "Non-Invasive Stress Test." This is because other than having an IV started in your vein, nothing is inserted or "invades" your body. Cardiac stress tests are done to find areas of poor blood flow to the heart by determining the extent of coronary artery disease (CAD). Some patients exercise on a treadmill, which naturally increases the blood flow to your heart, while others who are  unable to walk on a treadmill due to physical limitations have a pharmacologic/chemical stress agent called Lexiscan . This medicine will mimic walking on a treadmill by temporarily increasing your coronary blood flow.   Please note: these test may take anywhere between 2-4 hours to complete  [facility]  THE VOLUNTEERS AT THE FIRST DESK WILL DIRECT YOU WHERE TO GO  Date of Procedure:_____Thursday, Dec 28____  Arrival Time for Procedure:_____8:45 am____  Instructions regarding medication:   __X__ : Hold diabetes medication morning of procedure  __X__:  Hold METOPROLOL the night before and morning of procedure  How to prepare for your Myoview test:  1. Do not eat or drink after midnight 2. No caffeine for 24 hours prior to test 3. No smoking 24 hours prior to test. 4. Your medication may be taken with water.  If your doctor stopped a medication because of this test, do not take that medication. 5. Ladies, please do not wear dresses.  Skirts or pants are  appropriate. Please wear a short sleeve shirt. 6. No perfume, cologne or lotion.  I recommend watching educational videos on topics of interest to you at:       [email or web address].com  Enter code: HEARTCARE    Follow-Up: It was a pleasure seeing you in the office today. Please call us if you have new issues that need to be addressed before your next appt.  [phone]  Your physician wants you to follow-up in: 6 months.  You will receive a reminder letter in the mail two months in advance. If you don't receive a letter, please call our office to schedule the follow-up appointment.  If you need a refill on your cardiac medications before your next appointment, please call your pharmacy.    Cardiac Nuclear Scanning A cardiac nuclear scan is used to check your heart for problems, such as the following:  A portion of the heart is not getting enough blood.  Part of the heart muscle has died, which happens with a heart attack.  The heart wall is not working normally.  In this test, a radioactive dye (tracer) is injected into your bloodstream. After the tracer has traveled to your heart, a scanning device is used to measure how much of the tracer is absorbed by or distributed to various areas of your heart. LET [facility] KNOW ABOUT:  Any allergies you have.  All medicines you are taking, including vitamins, herbs, eye drops, creams, and over-the-counter medicines.  Previous problems you or members of your family have  had with the use of anesthetics.  Any blood disorders you have.  Previous surgeries you have had.  Medical conditions you have.  RISKS AND COMPLICATIONS Generally, this is a safe procedure. However, as with any procedure, problems can occur. Possible problems include:   Serious chest pain.  Rapid heartbeat.  Sensation of warmth in your chest. This usually passes quickly. BEFORE THE PROCEDURE Ask your health care provider about changing or stopping  your regular medicines. PROCEDURE This procedure is usually done at a hospital and takes 2-4 hours.  An IV tube is inserted into one of your veins.  Your health care provider will inject a small amount of radioactive tracer through the tube.  You will then wait for 20-40 minutes while the tracer travels through your bloodstream.  You will lie down on an exam table so images of your heart can be taken. Images will be taken for about 15-20 minutes.  You will exercise on a treadmill or stationary bike. While you exercise, your heart activity will be monitored with an electrocardiogram (ECG), and your blood pressure will be checked.  If you are unable to exercise, you may be given a medicine to make your heart beat faster.  When blood flow to your heart has peaked, tracer will again be injected through the IV tube.  After 20-40 minutes, you will get back on the exam table and have more images taken of your heart.  When the procedure is over, your IV tube will be removed. AFTER THE PROCEDURE  You will likely be able to leave shortly after the test. Unless your health care provider tells you otherwise, you may return to your normal schedule, including diet, activities, and medicines.  Make sure you find out how and when you will get your test results. This information is not intended to replace advice given to you by your health care provider. Make sure you discuss any questions you have with your health care provider. Document Released: 09/02/2004 Document Revised: 08/13/2013 Document Reviewed: 07/17/2013 Elsevier Interactive Patient Education  2017 [employer]

## 2016-08-18 ENCOUNTER — Ambulatory Visit
Admission: RE | Admit: 2016-08-18 | Discharge: 2016-08-18 | Disposition: A | Discharge status: Home / Self Care | Payer: Medicare Other | Source: Ambulatory Visit | Attending: Cardiovascular Disease | Admitting: Cardiovascular Disease

## 2016-08-18 DIAGNOSIS — I25810 Atherosclerosis of coronary artery bypass graft(s) without angina pectoris: Secondary | ICD-10-CM

## 2016-08-18 DIAGNOSIS — I209 Angina pectoris, unspecified: Secondary | ICD-10-CM

## 2016-08-18 DIAGNOSIS — R0602 Shortness of breath: Secondary | ICD-10-CM

## 2016-08-18 DIAGNOSIS — I25709 Atherosclerosis of coronary artery bypass graft(s), unspecified, with unspecified angina pectoris: Secondary | ICD-10-CM | POA: Diagnosis not present

## 2016-08-18 DIAGNOSIS — I255 Ischemic cardiomyopathy: Secondary | ICD-10-CM

## 2016-08-18 LAB — NM MYOCAR MULTI W/SPECT W/WALL MOTION / EF
CHL CUP NUCLEAR SSS: 23
CSEPHR: 79 %
LV sys vol: 67 mL
LVDIAVOL: 147 mL (ref 62–150)
Peak HR: 107 {beats}/min
Rest HR: 86 {beats}/min
SDS: 3
SRS: 20
TID: 0.95

## 2016-08-18 MED ORDER — TECHNETIUM TC 99M TETROFOSMIN IV KIT
28.1200 | PACK | Freq: Once | INTRAVENOUS | Status: AC | PRN
  Administered 2016-08-18: 28.12 via INTRAVENOUS

## 2016-08-18 MED ORDER — REGADENOSON 0.4 MG/5ML IV SOLN
0.4000 mg | Freq: Once | INTRAVENOUS | Status: AC
  Administered 2016-08-18: 0.4 mg via INTRAVENOUS

## 2016-08-18 MED ORDER — TECHNETIUM TC 99M TETROFOSMIN IV KIT
13.0000 | PACK | Freq: Once | INTRAVENOUS | Status: AC | PRN
  Administered 2016-08-18: 13.44 via INTRAVENOUS

## 2016-10-17 ENCOUNTER — Other Ambulatory Visit (INDEPENDENT_AMBULATORY_CARE_PROVIDER_SITE_OTHER): Payer: Self-pay | Admitting: Vascular Surgery

## 2016-10-17 DIAGNOSIS — N185 Chronic kidney disease, stage 5: Secondary | ICD-10-CM

## 2016-10-17 DIAGNOSIS — T82590A Other mechanical complication of surgically created arteriovenous fistula, initial encounter: Secondary | ICD-10-CM

## 2016-10-18 ENCOUNTER — Ambulatory Visit (INDEPENDENT_AMBULATORY_CARE_PROVIDER_SITE_OTHER): Payer: Medicare Other

## 2016-10-18 ENCOUNTER — Ambulatory Visit (INDEPENDENT_AMBULATORY_CARE_PROVIDER_SITE_OTHER): Payer: Self-pay | Admitting: Vascular Surgery

## 2016-10-18 DIAGNOSIS — N185 Chronic kidney disease, stage 5: Secondary | ICD-10-CM

## 2016-10-18 DIAGNOSIS — T82590A Other mechanical complication of surgically created arteriovenous fistula, initial encounter: Secondary | ICD-10-CM | POA: Diagnosis not present

## 2016-10-24 ENCOUNTER — Encounter (INDEPENDENT_AMBULATORY_CARE_PROVIDER_SITE_OTHER): Payer: Self-pay | Admitting: Vascular Surgery

## 2017-01-03 ENCOUNTER — Other Ambulatory Visit: Payer: Self-pay | Admitting: Nephrology

## 2017-01-03 DIAGNOSIS — R0602 Shortness of breath: Secondary | ICD-10-CM

## 2017-01-04 ENCOUNTER — Ambulatory Visit
Admission: RE | Admit: 2017-01-04 | Discharge: 2017-01-04 | Disposition: A | Discharge status: Home / Self Care | Payer: Medicare Other | Source: Ambulatory Visit | Attending: Nephrology | Admitting: Nephrology

## 2017-01-04 DIAGNOSIS — J90 Pleural effusion, not elsewhere classified: Secondary | ICD-10-CM | POA: Insufficient documentation

## 2017-01-04 DIAGNOSIS — R0602 Shortness of breath: Secondary | ICD-10-CM

## 2017-01-10 ENCOUNTER — Other Ambulatory Visit: Payer: Self-pay

## 2017-01-12 ENCOUNTER — Ambulatory Visit (INDEPENDENT_AMBULATORY_CARE_PROVIDER_SITE_OTHER): Payer: Medicare Other | Admitting: Cardiothoracic Surgery

## 2017-01-12 ENCOUNTER — Encounter: Payer: Self-pay | Admitting: Cardiothoracic Surgery

## 2017-01-12 ENCOUNTER — Telehealth: Payer: Self-pay

## 2017-01-12 VITALS — BP 118/62 | HR 77 | Temp 98.1°F | Ht 66.0 in | Wt 163.0 lb

## 2017-01-12 DIAGNOSIS — J90 Pleural effusion, not elsewhere classified: Secondary | ICD-10-CM

## 2017-01-12 NOTE — Patient Instructions (Signed)
I will contact you once I have your procedure scheduled.

## 2017-01-12 NOTE — Telephone Encounter (Signed)
Called patient's wife and had to leave her a voicemail in reference to her husband's appointment for the thoracentesis.  [facility]

## 2017-01-12 NOTE — Progress Notes (Signed)
  Patient ID: Kenneth [name], male   DOB: 1927/08/31, 83 y.o.   MRN: [id number]  HISTORY: This patient returns again to discuss management of his pleural effusions. He is an 83 year old gentleman with an extensive past medical history including coronary artery disease status post coronary bypass surgery as well as end-stage renal disease on dialysis. He has undergone a thoracentesis on both the right and left sides back in September of last year. He's had no interventions on his pleural space since then. He states that for the last couple weeks he's been getting more short of breath. He is unaware of what might be the cause but a chest x-ray was done which showed a small right-sided pleural effusion and a slightly larger left-sided pleural effusion which was obviously loculated. Patient comes in today to discuss management options.   Vitals:   01/12/17 0941  BP: 118/62  Pulse: 77  Temp: 98.1 F (36.7 C)     EXAM:    Resp: Lungs are clear bilaterally.  I did not really appreciate much decrease in breath sounds at the bases. No respiratory distress, normal effort. Heart:  Regular without murmurs Abd:  Abdomen is soft, non distended and non tender. No masses are palpable.  There is no rebound and no guarding.  Neurological: Alert and oriented to person, place, and time. Coordination normal.  Skin: Skin is warm and dry. No rash noted. No diaphoretic. No erythema. No pallor.  Psychiatric: Normal mood and affect. Normal behavior. Judgment and thought content normal.    ASSESSMENT: I had a long discussion with him regarding the possibilities. He does have radiographic evidence of bilateral pleural effusions. Although they are not large pleural effusions he may obtain some benefit from these being removed.   PLAN:   I talked to him about the options of placement of a Pleurx catheter. I reviewed with him the indications and risks as well as the alternatives. Since it's been such a long time  since his last thoracentesis he would like to try that again before embarking on a new procedure. He would not like to have a permanent Pleurx catheter placed and would like an ultrasound-guided thoracentesis instead. We'll go ahead and set him up for that. If the thoracentesis does not rectifies problems I would be happy to see him again.    Hulda Marinimothy Dekisha Mesmer, MD

## 2017-01-12 NOTE — Addendum Note (Signed)
Addended by: Adela PortsBONICHE, Edelin Fryer on: 01/12/2017 12:39 PM   Modules accepted: Orders

## 2017-01-17 ENCOUNTER — Telehealth: Payer: Self-pay

## 2017-01-17 NOTE — Telephone Encounter (Signed)
Amy from Dr. Eston EstersHande's office called stating that the patient's wife called them letting them know that her husband was SOB and that his Thoracentesis was scheduled to be done until 01/24/2017. Amy wanted to know if his appointment could be scheduled sooner or if she should tell him to go to the Emergency Room.  Amy was told that I would try to change his appointment to be sooner and that I would call her back. Angie then called Amy to let her know that unfortunately we were not able to change the appointment to a sooner date. Therefore, Amy was told to tell the patient's wife to take him to the emergency room. Amy agreed and stated that she would call patient's wife to inform her.  I informed Dr. Thelma Bargeaks of what was told to Amy (Dr. Eston EstersHande's nurse) and he agreed.

## 2017-01-22 DIAGNOSIS — I25118 Atherosclerotic heart disease of native coronary artery with other forms of angina pectoris: Secondary | ICD-10-CM | POA: Insufficient documentation

## 2017-01-22 NOTE — Progress Notes (Signed)
Cardiology Office Note  Date:  01/24/2017   ID:  Kenneth [name], DOB 10-03-1927, MRN [id number]  PCP:  Barbette Reichmann, MD   Chief Complaint  Patient presents with  . OTHER    Per Kidney specialist c/o chest tightness and sob. Meds reviewed verbally with pt.    HPI:  Kenneth [name] is a 83 y.o. male with history of  CAD  s/p CABG, occluded vein graft to an OM, patent LIMA to the LAD,  COPD on chronic oxygen 2 L with long smoking history,  ESRD on HD On Monday, Wednesday and Friday ,  OSA,  HTN  hospitalization November 2017 requiring thoracentesis, Extra hemodialysis,nephrology Dr.  Ronn Melena left chest pain  08/09/2016 . EMTs, emergency room . Cardiac enzymes negative 2  who presents for routine follow-up of his diastolic CHF/hypervolemia secondary to end-stage renal disease, pleural effusions  Some chest tightness on exertion, Etiology unclear Minimally active secondary to poor respiratory status  Had thoracentesis today on the right 1.6 L out per the patient There is pleural effusion on the left  He stopped taking torsemide on nondialysis days. Reports he was having leg cramping   previous anginal symptoms before bypass surgery , he did not develop chest pain   Extra 15 minutes added to his hemodialysis after his hospitalization November 2017    previous admission to [facility]  1/2-1/13/18  for AMS 2/2 metabolic encephalopathy and bronchitis,  EKG from the hospital earlier today personally reviewed by myself on todays visit Shows normal sinus rhythm with rate 93 bpm nonspecific T wave abnormality    other past medical history reviewed  Patient underwent cardiac bypass in 1999 with LIMA-LAD and SVG-OM1.  Symptoms at that time were fatigue and SOB.    scheduled for knee replacement surgery in May of 2010.   Lexiscan stress test by outside cardiology group on 12/23/2008 that showed moderate fixed inferoapical, apical and mild fixed posterolateral myocardial perfusion defect  c/w previous infarct and/or scar with small reversible inferoapical perfusion defect c/w myocardial ischemia. EF 45%.  cardiac cath on 01/06/2009 that showed an occluded SVG-OM1 and patent LIMA-LAD, mild anterior wall HK.   cardiac MRI on 11/18/210 that showed an EF of 48% and an akinetic mid inferolateral and mid anterolateral wall, as well as the apex. There was 50% thickness subendocardial delayed enhancement in those walls which likely corresponds to the LCx distribution.   lost to follow up for approximately 5 years.    . Anemia   . Anxiety   . Arthritis   . B12 deficiency   . Coronary artery disease    a. 2vCABG LIMA-LAD, SVG-OM1 in 1999; b. Lexiscan myoview (5/10) showed EF 45%, HK apex & lateral wall, fixed perfusion defect involving apex, inferoapex, & inferolateral wall, also some inferoapical ischemia; LHC (5/10) showed patent LIMA-LAD and totally occluded SVG-OM1, 99% pLAD, 40% ostial CFX, 40% mCFX, 30% OM1, 20% pRCA  . Diabetes mellitus   . ESRD on hemodialysis (HCC)    a. once weekly as of 08/2015  . GERD (gastroesophageal reflux disease)   . History of MRI of chest 11/10   a. Cardiac; EF 48%, mid anterolateral and inferolateral walls, apical lateral wall, apical septal wall, and true apex were all think and akinetic; 51-75% thickness subendocardial delayed enhancement in mid anterolateral and inferolateral walls, full thickness enhancement in apical laterla and apical septal walls and true apex  . Hyperlipidemia   . Hypertension   . Ischemic cardiomyopathy    a.  likely ICM, EF 55-60%, RWMA could not be excluded, nl PASP, nl CVP  . Nephrolithiasis   . Obesity   . Occasional tremors   . OSA (obstructive sleep apnea)    Moderate CPAP  . Shortness of breath dyspnea   . Spinal stenosis      PMH:   has a past medical history of Anemia; Anxiety; Arthritis; B12 deficiency; Coronary artery disease; Diabetes mellitus; ESRD on hemodialysis (HCC); GERD  (gastroesophageal reflux disease); History of MRI of chest (11/10); Hyperlipidemia; Hypertension; Ischemic cardiomyopathy; Nephrolithiasis; Obesity; Occasional tremors; OSA (obstructive sleep apnea); Shortness of breath dyspnea; and Spinal stenosis.  PSH:    Past Surgical History:  Procedure Laterality Date  . BACK SURGERY     Multiple, with spinal stenosis  . BASCILIC VEIN TRANSPOSITION Left 12/11/2015   Procedure: BASCILIC VEIN TRANSPOSITION ( STAGE 1 );  Surgeon: Renford DillsGregory G Schnier, MD;  Location: [facility] ORS;  Service: Vascular;  Laterality: Left;  . CARDIAC CATHETERIZATION    . CATARACT EXTRACTION    . CORONARY ARTERY BYPASS GRAFT  1999   In [place] with LIMA-LAD and SVG-OM1  . EYE SURGERY Bilateral    Cataract extraction with IOL  . INSERTION OF DIALYSIS CATHETER Right    [facility]  . JOINT REPLACEMENT Left    Total Knee Replacement  . PERIPHERAL VASCULAR CATHETERIZATION N/A 09/01/2015   Procedure: Dialysis/Perma Catheter Insertion;  Surgeon: Renford DillsGregory G Schnier, MD;  Location: [facility] INVASIVE CV LAB;  Service: Cardiovascular;  Laterality: N/A;  . PERIPHERAL VASCULAR CATHETERIZATION Left 02/25/2016   Procedure: Dialysis/Perma Catheter Removal;  Surgeon: Annice NeedyJason S Dew, MD;  Location: [facility] INVASIVE CV LAB;  Service: Cardiovascular;  Laterality: Left;  . PERIPHERAL VASCULAR CATHETERIZATION Left 03/22/2016   Procedure: A/V Shuntogram/Fistulagram;  Surgeon: Renford DillsGregory G Schnier, MD;  Location: [facility] INVASIVE CV LAB;  Service: Cardiovascular;  Laterality: Left;  . TOTAL KNEE ARTHROPLASTY  5/10    Current Outpatient Prescriptions  Medication Sig Dispense Refill  . aspirin EC 81 MG tablet Take 81 mg by mouth daily.    . B Complex-C-Folic Acid (VOL-CARE RX) 1 MG TABS Take 1 tablet by mouth once.  5  . calcium acetate (PHOSLO) 667 MG capsule Take 1 capsule by mouth 3 (three) times daily.    . cinacalcet (SENSIPAR) 30 MG tablet Take 1 tablet by mouth daily.    . hydrocortisone valerate ointment  (WESTCORT) 0.2 % Apply to affected area daily 45 g 1  . hydrOXYzine (ATARAX) 10 MG/5ML syrup Take 5 mLs (10 mg total) by mouth 3 (three) times daily as needed for itching. 118 mL 0  . Insulin Detemir (LEVEMIR FLEXTOUCH) 100 UNIT/ML Pen Inject 18 Units into the skin daily before breakfast. 70 units in AM, 30 units in PM    . IRON PO Take 1 tablet by mouth 1 day or 1 dose.    . lidocaine-prilocaine (EMLA) cream Apply 2.5 application topically as needed.    Marland Kitchen. lisinopril (PRINIVIL,ZESTRIL) 20 MG tablet Take 1 tablet by mouth 1 day or 1 dose.    . losartan (COZAAR) 50 MG tablet Take 50 mg by mouth daily.    Marland Kitchen. lovastatin (MEVACOR) 20 MG tablet Take 20 mg by mouth at bedtime.    . metoprolol succinate (TOPROL-XL) 50 MG 24 hr tablet Take 50 mg by mouth daily. Take with or immediately following a meal.    . sertraline (ZOLOFT) 25 MG tablet Take 1 tablet by mouth 1 day or 1 dose.    .Marland Kitchen  SPIRIVA HANDIHALER 18 MCG inhalation capsule Place 1 capsule into inhaler and inhale daily.    . tamsulosin (FLOMAX) 0.4 MG CAPS capsule Take 1 capsule (0.4 mg total) by mouth daily. 30 capsule 0  . torsemide (DEMADEX) 100 MG tablet Take 1 tablet (100 mg total) by mouth daily. Take on nondialysis days (Tues,Thurs, Sat, Sun) 30 tablet 11  . traZODone (DESYREL) 50 MG tablet Take 1 tablet by mouth at bedtime.    . nitroGLYCERIN (NITROSTAT) 0.4 MG SL tablet Place 1 tablet (0.4 mg total) under the tongue every 5 (five) minutes as needed for chest pain. 25 tablet 3   No current facility-administered medications for this visit.      Allergies:   Bee venom; Beef-derived products; Iodine; and Ivp dye [iodinated diagnostic agents]   Social History:  The patient  reports that he has quit smoking. His smoking use included Cigarettes. He has never used smokeless tobacco. He reports that he does not drink alcohol or use drugs.   Family History:   family history includes Heart failure in his father; Kidney failure in his mother.     Review of Systems: Review of Systems  Constitutional: Negative.   Respiratory: Positive for shortness of breath.   Cardiovascular: Positive for chest pain.  Gastrointestinal: Negative.   Musculoskeletal: Negative.   Neurological: Negative.   Psychiatric/Behavioral: Negative.   All other systems reviewed and are negative.    PHYSICAL EXAM: VS:  BP (!) 118/50 (BP Location: Right Arm, Patient Position: Sitting, Cuff Size: Normal)   Pulse 66   Ht 5\' 6"  (1.676 m)   Wt 161 lb (73 kg)   SpO2 93%   BMI 25.99 kg/m  , BMI Body mass index is 25.99 kg/m. GEN: Well nourished, well developed, in no acute distress , on nasal cannula oxygen, present since her wheelchair HEENT: normal  Neck: no JVD, carotid bruits, or masses Cardiac: RRR; no murmurs, rubs, or gallops,no edema , unable to appreciate lower extremity arterial pulses DP, PT Respiratory:  Decreased breath sounds left base halfway up, rales at right base,  normal work of breathing GI: soft, nontender, nondistended, + BS MS: no deformity or atrophy  Skin: warm and dry, no rash Neuro:  Strength and sensation are intact Psych: euthymic mood, full affect   Recent Labs: 07/14/2016: ALT 6 07/15/2016: Magnesium 2.1 08/09/2016: B Natriuretic Peptide 1,395.0 01/23/2017: BUN 19; Creatinine, Ser 3.43; Hemoglobin 11.2; Platelets 426; Potassium 4.1; Sodium 135    Lipid Panel Lab Results  Component Value Date   CHOL 113 08/24/2015   HDL 52 08/24/2015   LDLCALC 45 08/24/2015   TRIG 81 08/24/2015      Wt Readings from Last 3 Encounters:  01/24/17 161 lb (73 kg)  01/23/17 162 lb (73.5 kg)  01/12/17 163 lb (73.9 kg)       ASSESSMENT AND PLAN:  Angina pectoris (HCC) -  Previous episodes of chest pain even on his last clinic visit  He preferred to avoid cardiac catheterization  He would be high risk  Stress test December 2017 with no significant ischemia  Suspect some of his symptoms are secondary to his fluid overload,  pleural effusion  Atherosclerosis of coronary artery bypass graft of native heart without angina pectoris -  Known severe coronary artery disease, patent LIMA to the LAD on prior cardiac catheterization with occluded vein graft to the OM  SOB (shortness of breath) -  Secondary to COPD, pulmonary edema, pleural effusion He stopped taking his torsemide secondary  to cramping per the patient If unable to extend his hemodialysis for extra fluid removal, he may need to take his torsemide for days a week in an effort to minimize recurrence of his pleural effusion.  Type 2 diabetes mellitus with stage 4 chronic kidney disease, with long-term current use of insulin (HCC) We have encouraged continued careful diet management   ESRD on dialysis [facility]) Long discussion concerning his fluid status Previous hospitalization for fluid overload requiring longer hemodialysis, thoracentesis He had repeat thoracentesis today with residual effusion on the left  Suggested he restart his torsemide on nondialysis days Talk with nephrology about extending his hemodialysis  COPD Also contributing to his shortness of breath symptoms Little reserve, will likely benefit from prerenal state, more aggressive hemodialysis   Long discussion with family in the room with him today including his daughter  Total encounter time more than 45 minutes  Greater than 50% was spent in counseling and coordination of care with the patient   Disposition:   F/U  6 months   No orders of the defined types were placed in this encounter.    Signed, Dossie Arbour, M.D., Ph.D. 01/24/2017  [facility] [place], [place] [phone]

## 2017-01-23 ENCOUNTER — Emergency Department: Payer: Medicare Other

## 2017-01-23 ENCOUNTER — Emergency Department
Admission: EM | Admit: 2017-01-23 | Discharge: 2017-01-23 | Disposition: A | Discharge status: Home / Self Care | Payer: Medicare Other | Attending: Emergency Medicine | Admitting: Emergency Medicine

## 2017-01-23 DIAGNOSIS — R0602 Shortness of breath: Secondary | ICD-10-CM

## 2017-01-23 DIAGNOSIS — Z794 Long term (current) use of insulin: Secondary | ICD-10-CM | POA: Diagnosis not present

## 2017-01-23 DIAGNOSIS — I132 Hypertensive heart and chronic kidney disease with heart failure and with stage 5 chronic kidney disease, or end stage renal disease: Secondary | ICD-10-CM | POA: Insufficient documentation

## 2017-01-23 DIAGNOSIS — N186 End stage renal disease: Secondary | ICD-10-CM | POA: Insufficient documentation

## 2017-01-23 DIAGNOSIS — Z7982 Long term (current) use of aspirin: Secondary | ICD-10-CM | POA: Diagnosis not present

## 2017-01-23 DIAGNOSIS — J90 Pleural effusion, not elsewhere classified: Secondary | ICD-10-CM | POA: Diagnosis not present

## 2017-01-23 DIAGNOSIS — E1122 Type 2 diabetes mellitus with diabetic chronic kidney disease: Secondary | ICD-10-CM | POA: Insufficient documentation

## 2017-01-23 DIAGNOSIS — I5032 Chronic diastolic (congestive) heart failure: Secondary | ICD-10-CM | POA: Diagnosis not present

## 2017-01-23 DIAGNOSIS — I25810 Atherosclerosis of coronary artery bypass graft(s) without angina pectoris: Secondary | ICD-10-CM | POA: Insufficient documentation

## 2017-01-23 DIAGNOSIS — Z992 Dependence on renal dialysis: Secondary | ICD-10-CM | POA: Insufficient documentation

## 2017-01-23 DIAGNOSIS — Z79899 Other long term (current) drug therapy: Secondary | ICD-10-CM | POA: Diagnosis not present

## 2017-01-23 DIAGNOSIS — Z87891 Personal history of nicotine dependence: Secondary | ICD-10-CM | POA: Insufficient documentation

## 2017-01-23 LAB — CBC WITH DIFFERENTIAL/PLATELET
Basophils Absolute: 0.1 10*3/uL (ref 0–0.1)
Basophils Relative: 1 %
EOS PCT: 2 %
Eosinophils Absolute: 0.1 10*3/uL (ref 0–0.7)
HCT: 34.4 % — ABNORMAL LOW (ref 40.0–52.0)
Hemoglobin: 11.2 g/dL — ABNORMAL LOW (ref 13.0–18.0)
LYMPHS ABS: 0.8 10*3/uL — AB (ref 1.0–3.6)
LYMPHS PCT: 15 %
MCH: 31.5 pg (ref 26.0–34.0)
MCHC: 32.7 g/dL (ref 32.0–36.0)
MCV: 96.2 fL (ref 80.0–100.0)
Monocytes Absolute: 0.4 10*3/uL (ref 0.2–1.0)
Monocytes Relative: 8 %
Neutro Abs: 4.2 10*3/uL (ref 1.4–6.5)
Neutrophils Relative %: 74 %
PLATELETS: 426 10*3/uL (ref 150–440)
RBC: 3.57 MIL/uL — ABNORMAL LOW (ref 4.40–5.90)
RDW: 19 % — ABNORMAL HIGH (ref 11.5–14.5)
WBC: 5.6 10*3/uL (ref 3.8–10.6)

## 2017-01-23 LAB — BASIC METABOLIC PANEL
Anion gap: 8 (ref 5–15)
BUN: 19 mg/dL (ref 6–20)
CALCIUM: 8.6 mg/dL — AB (ref 8.9–10.3)
CO2: 31 mmol/L (ref 22–32)
CREATININE: 3.43 mg/dL — AB (ref 0.61–1.24)
Chloride: 96 mmol/L — ABNORMAL LOW (ref 101–111)
GFR calc Af Amer: 17 mL/min — ABNORMAL LOW (ref >60)
GFR, EST NON AFRICAN AMERICAN: 15 mL/min — AB (ref >60)
GLUCOSE: 155 mg/dL — AB (ref 65–99)
Potassium: 4.1 mmol/L (ref 3.5–5.1)
Sodium: 135 mmol/L (ref 135–145)

## 2017-01-23 LAB — TROPONIN I: Troponin I: 0.07 ng/mL (ref <0.03)

## 2017-01-23 NOTE — ED Notes (Signed)

## 2017-01-23 NOTE — ED Notes (Signed)
ED Provider at bedside. 

## 2017-01-23 NOTE — Discharge Instructions (Signed)
Please seek medical attention for any high fevers, chest pain, shortness of breath, change in behavior, persistent vomiting, bloody stool or any other new or concerning symptoms.  

## 2017-01-23 NOTE — ED Notes (Signed)
Patient transported to X-ray 

## 2017-01-23 NOTE — ED Provider Notes (Signed)
[facility] Emergency Department Provider Note   ____________________________________________   I have reviewed the triage vital signs and the nursing notes.   HISTORY  Chief Complaint Shortness of breath  History limited by: Not Limited   HPI Kenneth [name] is a 83 y.o. male who presents to the emergency department today via EMS because of concerns for shortness of breath. Patient states his breathing has gotten worse. Yesterday night however he had a particularly bad night. He states he could not get good sleep because of his shortness of breath. He becomes very short of breath with exertion. States that he has a history of bypass and cardiac catheters states he somewhat feels like he felt prior to his open heart surgery in terms of weakness. Additionally the patient states that he has had to have fluid drained from his lungs in the past and is concerned that could be going on as well. From work because of concerns for altered mental status. The patient states   Past Medical History:  Diagnosis Date  . Anemia   . Anxiety   . Arthritis   . B12 deficiency   . Coronary artery disease    a. 2vCABG LIMA-LAD, SVG-OM1 in 1999; b. Lexiscan myoview (5/10) showed EF 45%, HK apex & lateral wall, fixed perfusion defect involving apex, inferoapex, & inferolateral wall, also some inferoapical ischemia; LHC (5/10) showed patent LIMA-LAD and totally occluded SVG-OM1, 99% pLAD, 40% ostial CFX, 40% mCFX, 30% OM1, 20% pRCA  . Diabetes mellitus   . ESRD on hemodialysis (HCC)    a. once weekly as of 08/2015. Currently 3xweek since 09/11/15.  Marland Kitchen GERD (gastroesophageal reflux disease)   . History of MRI of chest 11/10   a. Cardiac; EF 48%, mid anterolateral and inferolateral walls, apical lateral wall, apical septal wall, and true apex were all think and akinetic; 51-75% thickness subendocardial delayed enhancement in mid anterolateral and inferolateral walls, full thickness  enhancement in apical laterla and apical septal walls and true apex  . Hyperlipidemia   . Hypertension   . Ischemic cardiomyopathy    a. likely ICM, EF 55-60%, RWMA could not be excluded, nl PASP, nl CVP  . Nephrolithiasis   . Obesity   . Occasional tremors   . OSA (obstructive sleep apnea)    Moderate CPAP  . Shortness of breath dyspnea   . Spinal stenosis     Patient Active Problem List   Diagnosis Date Noted  . Coronary artery disease of native artery of native heart with stable angina pectoris (HCC) 01/22/2017  . Pulmonary edema 07/15/2016  . Anemia, unspecified 05/20/2016  . ESRD on dialysis (HCC) 09/28/2015  . Ischemic cardiomyopathy   . CKD (chronic kidney disease) stage 4, GFR 15-29 ml/min (HCC) 08/26/2015  . Sinus tachycardia 08/26/2015  . Demand ischemia (HCC)   . Coronary artery disease involving coronary bypass graft of native heart without angina pectoris   . Ischemic chest pain   . Acute non-ST elevation myocardial infarction (NSTEMI) (HCC) 08/24/2015  . NSTEMI (non-ST elevated myocardial infarction) (HCC)   . Angina pectoris (HCC)   . SOB (shortness of breath)   . Chronic diastolic CHF (congestive heart failure) (HCC)   . Centrilobular emphysema (HCC)   . Coronary artery disease involving coronary bypass graft of native heart with angina pectoris with documented spasm (HCC)   . Mixed hyperlipidemia   . ED (erectile dysfunction) 10/06/2014  . Type 2 diabetes mellitus with chronic kidney disease (HCC) 05/26/2014  .  Personal history of other malignant neoplasm of skin 03/19/2012  . OSA on CPAP 08/19/2009  . DYSPNEA 07/17/2009  . SNORING 07/17/2009  . HYPERLIPIDEMIA-MIXED 07/02/2009  . HYPERTENSION, UNSPECIFIED 07/02/2009  . CORONARY ATHEROSCLEROSIS OF ARTERY BYPASS GRAFT 07/02/2009    Past Surgical History:  Procedure Laterality Date  . BACK SURGERY     Multiple, with spinal stenosis  . BASCILIC VEIN TRANSPOSITION Left 12/11/2015   Procedure: BASCILIC VEIN  TRANSPOSITION ( STAGE 1 );  Surgeon: Renford Dills, MD;  Location: [facility] ORS;  Service: Vascular;  Laterality: Left;  . CARDIAC CATHETERIZATION    . CATARACT EXTRACTION    . CORONARY ARTERY BYPASS GRAFT  1999   In [place] with LIMA-LAD and SVG-OM1  . EYE SURGERY Bilateral    Cataract extraction with IOL  . INSERTION OF DIALYSIS CATHETER Right    Dr. Wyn Quaker, [facility]  . JOINT REPLACEMENT Left    Total Knee Replacement  . PERIPHERAL VASCULAR CATHETERIZATION N/A 09/01/2015   Procedure: Dialysis/Perma Catheter Insertion;  Surgeon: Renford Dills, MD;  Location: [facility] INVASIVE CV LAB;  Service: Cardiovascular;  Laterality: N/A;  . PERIPHERAL VASCULAR CATHETERIZATION Left 02/25/2016   Procedure: Dialysis/Perma Catheter Removal;  Surgeon: Annice Needy, MD;  Location: [facility] INVASIVE CV LAB;  Service: Cardiovascular;  Laterality: Left;  . PERIPHERAL VASCULAR CATHETERIZATION Left 03/22/2016   Procedure: A/V Shuntogram/Fistulagram;  Surgeon: Renford Dills, MD;  Location: [facility] INVASIVE CV LAB;  Service: Cardiovascular;  Laterality: Left;  . TOTAL KNEE ARTHROPLASTY  5/10    Prior to Admission medications   Medication Sig Start Date End Date Taking? Authorizing Provider  aspirin EC 81 MG tablet Take 81 mg by mouth daily.    [provider]  B Complex-C-Folic Acid (VOL-CARE RX) 1 MG TABS Take 1 tablet by mouth once. 12/15/16   [provider]  calcium acetate (PHOSLO) 667 MG capsule Take 1 capsule by mouth 3 (three) times daily. 06/17/16   [provider]  cinacalcet (SENSIPAR) 30 MG tablet Take 1 tablet by mouth daily. 12/17/15   [provider]  hydrocortisone valerate ointment (WESTCORT) 0.2 % Apply to affected area daily 06/20/16 06/20/17  Joni Reining, PA-C  hydrOXYzine (ATARAX) 10 MG/5ML syrup Take 5 mLs (10 mg total) by mouth 3 (three) times daily as needed for itching. 06/20/16   Joni Reining, PA-C  Insulin Detemir (LEVEMIR FLEXTOUCH) 100 UNIT/ML Pen Inject  18 Units into the skin daily before breakfast. 70 units in AM, 30 units in PM    [provider]  IRON PO Take 1 tablet by mouth 1 day or 1 dose.    [provider]  lidocaine-prilocaine (EMLA) cream Apply 2.5 application topically as needed.    [provider]  lisinopril (PRINIVIL,ZESTRIL) 20 MG tablet Take 1 tablet by mouth 1 day or 1 dose.    [provider]  losartan (COZAAR) 50 MG tablet Take 50 mg by mouth daily.    [provider]  lovastatin (MEVACOR) 20 MG tablet Take 20 mg by mouth at bedtime.    [provider]  metoprolol succinate (TOPROL-XL) 50 MG 24 hr tablet Take 50 mg by mouth daily. Take with or immediately following a meal.    [provider]  nitroGLYCERIN (NITROSTAT) 0.4 MG SL tablet Place 1 tablet (0.4 mg total) under the tongue every 5 (five) minutes as needed for chest pain. 08/17/16 11/15/16  Antonieta Iba, MD  sertraline (ZOLOFT) 25 MG tablet Take 1 tablet  by mouth 1 day or 1 dose. 12/07/16   [provider]  SPIRIVA HANDIHALER 18 MCG inhalation capsule Place 1 capsule into inhaler and inhale daily. 05/11/16   [provider]  tamsulosin (FLOMAX) 0.4 MG CAPS capsule Take 1 capsule (0.4 mg total) by mouth daily. 09/04/15   Gouru, Deanna ArtisAruna, MD  TOPROL XL 25 MG 24 hr tablet Take 25 mg by mouth 3 (three) times daily. 01/10/17   [provider]  torsemide (DEMADEX) 100 MG tablet Take 100 mg by mouth daily. Take on nondialysis days (Tues,Thurs, Sat, Sun)    [provider]  traZODone (DESYREL) 50 MG tablet Take 1 tablet by mouth at bedtime. 05/17/16   [provider]  traZODone (DESYREL) 50 MG tablet Take 1 tablet by mouth at bedtime. 10/24/16 10/24/17  [provider]    Allergies Bee venom; Beef-derived products; Iodine; and Ivp dye [iodinated diagnostic agents]  Family History  Problem Relation Age of Onset  . Kidney failure Mother   . Heart failure Father      Social History Social History  Substance Use Topics  . Smoking status: Former Smoker    Types: Cigarettes  . Smokeless tobacco: Never Used  . Alcohol use No    Review of Systems Constitutional: No fever/chills Eyes: No visual changes. ENT: No sore throat. Cardiovascular: Denies chest pain. Respiratory: Positive for shortness of breath. Gastrointestinal: No abdominal pain.  No nausea, no vomiting.  No diarrhea.   Genitourinary: Negative for dysuria. Musculoskeletal: Negative for back pain. Skin: Negative for rash. Neurological: Negative for headaches, focal weakness or numbness.  ____________________________________________   PHYSICAL EXAM:  VITAL SIGNS: ED Triage Vitals  Enc Vitals Group     BP 133/64     Pulse 94     Resp 21     Temp 98.8     Temp src      SpO2 98     Weight      Height     Constitutional: Alert and oriented. Well appearing and in no distress. Eyes: Conjunctivae are normal.  ENT   Head: Normocephalic and atraumatic.   Nose: No congestion/rhinnorhea.   Mouth/Throat: Mucous membranes are moist.   Neck: No stridor. Hematological/Lymphatic/Immunilogical: No cervical lymphadenopathy. Cardiovascular: Normal rate, regular rhythm.  No murmurs, rubs, or gallops. Respiratory: Normal respiratory effort without tachypnea nor retractions. Diminished breath sounds in bilateral bases. Gastrointestinal: Soft and non tender. No rebound. No guarding.  Genitourinary: Deferred Musculoskeletal: Normal range of motion in all extremities. No lower extremity edema. Neurologic:  Normal speech and language. No gross focal neurologic deficits are appreciated.  Skin:  Skin is warm, dry and intact. No rash noted. Psychiatric: Mood and affect are normal. Speech and behavior are normal. Patient exhibits appropriate insight and judgment.  ____________________________________________    LABS (pertinent positives/negatives)  Labs Reviewed  CBC WITH  DIFFERENTIAL/PLATELET - Abnormal; Notable for the following:       Result Value   RBC 3.57 (*)    Hemoglobin 11.2 (*)    HCT 34.4 (*)    RDW 19.0 (*)    Lymphs Abs 0.8 (*)    All other components within normal limits  BASIC METABOLIC PANEL - Abnormal; Notable for the following:    Chloride 96 (*)    Glucose, Bld 155 (*)    Creatinine, Ser 3.43 (*)    Calcium 8.6 (*)    GFR calc non Af Amer 15 (*)    GFR calc Af Amer 17 (*)  All other components within normal limits  TROPONIN I - Abnormal; Notable for the following:    Troponin I 0.07 (*)    All other components within normal limits     ____________________________________________   EKG  I, Phineas Semen, attending physician, personally viewed and interpreted this EKG  EKG Time: 1800 Rate: 93 Rhythm: sinus rhythm with 1st degree av block Axis: normal Intervals: qtc 432, with 1st degree av block QRS: narrow ST changes: no st elevation Impression: abnormal ekg   ____________________________________________    RADIOLOGY  CXR IMPRESSION:  Small right and moderate left pleural effusion. Findings most  concerning for persistent CHF.       ____________________________________________   PROCEDURES  Procedures  ____________________________________________   INITIAL IMPRESSION / ASSESSMENT AND PLAN / ED COURSE  Pertinent labs & imaging results that were available during my care of the patient were reviewed by me and considered in my medical decision making (see chart for details).  Patient presented to the emergency department today with concerns for shortness of breath. Patient's x-ray does show bilateral pleural effusions. Patient has had same the past and is scheduled for thoracentesis tomorrow. I did discussion with the patient. It did offer admission. Did state however that it would likely not be until tomorrow that he would have the thoracentesis even if he stayed. This point the patient felt  comfortable going home. He is on oxygen at home and maintain good O2 saturations on his baseline oxygen level.  ____________________________________________   FINAL CLINICAL IMPRESSION(S) / ED DIAGNOSES  Final diagnoses:  Shortness of breath  Pleural effusion     Note: This dictation was prepared with Dragon dictation. Any transcriptional errors that result from this process are unintentional     Phineas Semen, MD 01/23/17 2035

## 2017-01-23 NOTE — Discharge Instructions (Signed)
Thoracentesis, Care After °Refer to this sheet in the next few weeks. These instructions provide you with information about caring for yourself after your procedure. Your health care provider may also give you more specific instructions. Your treatment has been planned according to current medical practices, but problems sometimes occur. Call your health care provider if you have any problems or questions after your procedure. °What can I expect after the procedure? °After your procedure, it is common to have pain at the puncture site. °Follow these instructions at home: °· Take medicines only as directed by your health care provider. °· You may return to your normal diet and normal activities as directed by your health care provider. °· Drink enough fluid to keep your urine clear or pale yellow. °· Do not take baths, swim, or use a hot tub until your health care provider approves. °· Follow your health care provider's instructions about: °? Puncture site care. °? Bandage (dressing) changes and removal. °· Check your puncture site every day for signs of infection. Watch for: °? Redness, swelling, or pain. °? Fluid, blood, or pus. °· Keep all follow-up visits as directed by your health care provider. This is important. °Contact a health care provider if: °· You have redness, swelling, or pain at your puncture site. °· You have fluid, blood, or pus coming from your puncture site. °· You have a fever. °· You have chills. °· You have nausea or vomiting. °· You have trouble breathing. °· You develop a worsening cough. °Get help right away if: °· You have extreme shortness of breath. °· You develop chest pain. °· You faint or feel light-headed. °This information is not intended to replace advice given to you by your health care provider. Make sure you discuss any questions you have with your health care provider. °Document Released: 08/29/2014 Document Revised: 04/09/2016 Document Reviewed: 05/20/2014 °Elsevier  Interactive Patient Education © 2018 Elsevier Inc. ° °

## 2017-01-23 NOTE — ED Triage Notes (Signed)
[facility] EMS[facility]. Pt states he has increased chest tightness when he is SHOB. Pt is on 2L of oxygen at home. Pt is also a dialysis pt and reports having dialysis today. Pt also states he is [facility] Pt is currently on 2L of oxygen and is at 98%. Pt reports increased SHOB with exertion. Denies fever and is A&O at this time.

## 2017-01-24 ENCOUNTER — Ambulatory Visit
Admission: RE | Admit: 2017-01-24 | Discharge: 2017-01-24 | Disposition: A | Discharge status: Home / Self Care | Payer: Medicare Other | Source: Ambulatory Visit | Attending: Diagnostic Radiology | Admitting: Diagnostic Radiology

## 2017-01-24 ENCOUNTER — Encounter: Payer: Self-pay | Admitting: Cardiovascular Disease

## 2017-01-24 ENCOUNTER — Ambulatory Visit
Admission: RE | Admit: 2017-01-24 | Discharge: 2017-01-24 | Disposition: A | Discharge status: Home / Self Care | Payer: Medicare Other | Source: Ambulatory Visit | Attending: Cardiothoracic Surgery | Admitting: Cardiothoracic Surgery

## 2017-01-24 ENCOUNTER — Ambulatory Visit (INDEPENDENT_AMBULATORY_CARE_PROVIDER_SITE_OTHER): Payer: Medicare Other | Admitting: Cardiovascular Disease

## 2017-01-24 VITALS — BP 118/50 | HR 66 | Ht 66.0 in | Wt 161.0 lb

## 2017-01-24 DIAGNOSIS — I5032 Chronic diastolic (congestive) heart failure: Secondary | ICD-10-CM

## 2017-01-24 DIAGNOSIS — J90 Pleural effusion, not elsewhere classified: Secondary | ICD-10-CM | POA: Insufficient documentation

## 2017-01-24 DIAGNOSIS — N186 End stage renal disease: Secondary | ICD-10-CM

## 2017-01-24 DIAGNOSIS — Z794 Long term (current) use of insulin: Secondary | ICD-10-CM | POA: Diagnosis not present

## 2017-01-24 DIAGNOSIS — I25118 Atherosclerotic heart disease of native coronary artery with other forms of angina pectoris: Secondary | ICD-10-CM

## 2017-01-24 DIAGNOSIS — Z9889 Other specified postprocedural states: Secondary | ICD-10-CM | POA: Diagnosis not present

## 2017-01-24 DIAGNOSIS — Z992 Dependence on renal dialysis: Secondary | ICD-10-CM

## 2017-01-24 DIAGNOSIS — I25708 Atherosclerosis of coronary artery bypass graft(s), unspecified, with other forms of angina pectoris: Secondary | ICD-10-CM

## 2017-01-24 DIAGNOSIS — G4733 Obstructive sleep apnea (adult) (pediatric): Secondary | ICD-10-CM

## 2017-01-24 DIAGNOSIS — E1122 Type 2 diabetes mellitus with diabetic chronic kidney disease: Secondary | ICD-10-CM

## 2017-01-24 DIAGNOSIS — Z9989 Dependence on other enabling machines and devices: Secondary | ICD-10-CM

## 2017-01-24 DIAGNOSIS — N184 Chronic kidney disease, stage 4 (severe): Secondary | ICD-10-CM

## 2017-01-24 DIAGNOSIS — E782 Mixed hyperlipidemia: Secondary | ICD-10-CM

## 2017-01-24 DIAGNOSIS — R0602 Shortness of breath: Secondary | ICD-10-CM

## 2017-01-24 MED ORDER — TORSEMIDE 100 MG PO TABS: 100.0000 mg | ORAL_TABLET | Freq: Every day | ORAL | 11 refills | Status: DC

## 2017-01-24 NOTE — Procedures (Signed)
US right thoracentesis without difficulty  Complications:  None  Blood Loss: none  See dictation in canopy pacs  

## 2017-01-24 NOTE — Patient Instructions (Addendum)
Talk with Dr. Ronn MelenaKolloru: Ask if dialysis time can be extended by 15 min Try to cut down on your fluid intake Restart torsemide on non-dialysis days as tolerated (limited by cramps) If no improvement in breathing, Consider thoracentesis on the left   Medication Instructions:   Consider taking torsemide on non dialysis days    Labwork:  No new labs needed  Testing/Procedures:  No further testing at this time   I recommend watching educational videos on topics of interest to you at:       [email or web address]  Enter code: [email or web address]    Follow-Up: It was a pleasure seeing you in the office today. Please call us if you have new issues that need to be addressed before your next appt.  [phone]  Your physician wants you to follow-up in: 3 months.  You will receive a reminder letter in the mail two months in advance. If you don't receive a letter, please call our office to schedule the follow-up appointment.  If you need a refill on your cardiac medications before your next appointment, please call your pharmacy.

## 2017-01-26 LAB — CYTOLOGY - NON PAP

## 2017-02-06 ENCOUNTER — Telehealth: Payer: Self-pay | Admitting: Cardiovascular Disease

## 2017-02-06 NOTE — Telephone Encounter (Signed)
Spoke w/ pt's wife.  Advised her of Dr. Windell HummingbirdGollan's recommendation. She reports that pt as an appt w/ Dr. Marcello FennelHande next week, but she will call and see if he can set up thoracentesis before that appt.  She asks if pt is nearing the end of his life; advised her that I cannot make that determination. She is appreciative and will call back if we can be of further assistance.

## 2017-02-06 NOTE — Telephone Encounter (Signed)
I would recommend that they call Dr. Inez Catalinaakes (and Dr. Marcello FennelHande) To schedule repeat urgent thoracentesis on the other side. Fluid only pulled out of one side recently, other side also has fluid Would also make sure he is taking his torsemide on nondialysis days

## 2017-02-06 NOTE — Telephone Encounter (Signed)
Pt wife calling stating pt is having more problems with his breathing  They had to take a liter of fluid off him  She states it is getting worst and he asked to her to call us  Would like some advise to see if we could help with this Please call back

## 2017-02-06 NOTE — Telephone Encounter (Signed)
Spoke w/ pt's wife. She reports that pt has had a rough 2 weeks since his last ov. Pt had thoracentesis on 01/24/17 - they pulled off 1.5 liters from his rt lung, but this did not help breathing much. Pt's nebulizer treatments help briefly, but not for very long.  Pt does not weigh daily, they weigh him BID at HD, and have increased the amount of fluid that they pull off, per Dr. Windell HummingbirdGollan's recommendation.  Pt takes torsemide as directed, but she does not feel that he is actually gaining fluid wt.  Pt has COPD - does not have pulmonologist. She states that it is hard to watch pt struggle to breathe, that he is panting while sitting.  Any type of exertion wears him out, even just a few steps down the hall. She would like to know if Dr. Mariah MillingGollan has any suggestions to help pt breathe. She would like to avoid the ED if possible. Advised her that I will make Dr. Mariah MillingGollan aware of her concerns and call her back w/ his recommendation.

## 2017-02-07 ENCOUNTER — Other Ambulatory Visit: Payer: Self-pay | Admitting: Internal Medicine

## 2017-02-08 ENCOUNTER — Other Ambulatory Visit: Payer: Self-pay | Admitting: Internal Medicine

## 2017-02-08 DIAGNOSIS — J90 Pleural effusion, not elsewhere classified: Secondary | ICD-10-CM

## 2017-02-15 ENCOUNTER — Ambulatory Visit
Admission: RE | Admit: 2017-02-15 | Discharge: 2017-02-15 | Disposition: A | Discharge status: Home / Self Care | Payer: Medicare Other | Source: Ambulatory Visit | Attending: Interventional Radiology | Admitting: Interventional Radiology

## 2017-02-15 ENCOUNTER — Ambulatory Visit
Admission: RE | Admit: 2017-02-15 | Discharge: 2017-02-15 | Disposition: A | Discharge status: Home / Self Care | Payer: Medicare Other | Source: Ambulatory Visit | Attending: Internal Medicine | Admitting: Internal Medicine

## 2017-02-15 DIAGNOSIS — Y844 Aspiration of fluid as the cause of abnormal reaction of the patient, or of later complication, without mention of misadventure at the time of the procedure: Secondary | ICD-10-CM | POA: Insufficient documentation

## 2017-02-15 DIAGNOSIS — J95811 Postprocedural pneumothorax: Secondary | ICD-10-CM | POA: Insufficient documentation

## 2017-02-15 DIAGNOSIS — J90 Pleural effusion, not elsewhere classified: Secondary | ICD-10-CM | POA: Insufficient documentation

## 2017-02-15 LAB — BODY FLUID CELL COUNT WITH DIFFERENTIAL
Eos, Fluid: 0 %
Lymphs, Fluid: 85 %
MONOCYTE-MACROPHAGE-SEROUS FLUID: 12 %
NEUTROPHIL FLUID: 3 %
Other Cells, Fluid: 0 %
WBC FLUID: 67 uL

## 2017-02-15 LAB — LACTATE DEHYDROGENASE, PLEURAL OR PERITONEAL FLUID: LD, Fluid: 51 U/L — ABNORMAL HIGH (ref 3–23)

## 2017-02-15 LAB — PROTEIN, PLEURAL OR PERITONEAL FLUID: Total protein, fluid: <3 g/dL

## 2017-02-15 NOTE — Procedures (Signed)
Pleural effusions  S/p lt thoracentesis  1 l blood tinged fld removed  No comp Stable cxr pendnig

## 2017-02-16 LAB — PH, BODY FLUID: PH, BODY FLUID: 7.7

## 2017-02-16 LAB — MISC LABCORP TEST (SEND OUT): Labcorp test code: 19588

## 2017-02-20 LAB — CYTOLOGY - NON PAP

## 2017-02-27 ENCOUNTER — Other Ambulatory Visit: Payer: Self-pay | Admitting: Nephrology

## 2017-02-27 DIAGNOSIS — R188 Other ascites: Secondary | ICD-10-CM

## 2017-03-01 ENCOUNTER — Other Ambulatory Visit: Payer: Self-pay | Admitting: Nephrology

## 2017-03-01 ENCOUNTER — Ambulatory Visit
Admission: RE | Admit: 2017-03-01 | Discharge: 2017-03-01 | Disposition: A | Discharge status: Home / Self Care | Payer: Medicare Other | Source: Ambulatory Visit | Attending: Nephrology | Admitting: Nephrology

## 2017-03-01 ENCOUNTER — Ambulatory Visit
Admission: RE | Admit: 2017-03-01 | Discharge: 2017-03-01 | Disposition: A | Discharge status: Home / Self Care | Payer: Medicare Other | Source: Ambulatory Visit | Attending: Interventional Radiology | Admitting: Interventional Radiology

## 2017-03-01 DIAGNOSIS — I517 Cardiomegaly: Secondary | ICD-10-CM | POA: Insufficient documentation

## 2017-03-01 DIAGNOSIS — J90 Pleural effusion, not elsewhere classified: Secondary | ICD-10-CM | POA: Insufficient documentation

## 2017-03-01 DIAGNOSIS — R188 Other ascites: Secondary | ICD-10-CM

## 2017-03-01 DIAGNOSIS — J811 Chronic pulmonary edema: Secondary | ICD-10-CM | POA: Diagnosis not present

## 2017-03-01 DIAGNOSIS — Z9889 Other specified postprocedural states: Secondary | ICD-10-CM | POA: Diagnosis not present

## 2017-04-12 ENCOUNTER — Other Ambulatory Visit: Payer: Self-pay | Admitting: Nephrology

## 2017-04-12 DIAGNOSIS — J90 Pleural effusion, not elsewhere classified: Secondary | ICD-10-CM

## 2017-04-14 ENCOUNTER — Other Ambulatory Visit (INDEPENDENT_AMBULATORY_CARE_PROVIDER_SITE_OTHER): Payer: Self-pay | Admitting: Vascular Surgery

## 2017-04-14 DIAGNOSIS — N186 End stage renal disease: Secondary | ICD-10-CM

## 2017-04-17 ENCOUNTER — Ambulatory Visit (INDEPENDENT_AMBULATORY_CARE_PROVIDER_SITE_OTHER): Payer: Medicare Other | Admitting: Vascular Surgery

## 2017-04-17 ENCOUNTER — Encounter (INDEPENDENT_AMBULATORY_CARE_PROVIDER_SITE_OTHER): Payer: Medicare Other

## 2017-04-19 ENCOUNTER — Inpatient Hospital Stay: Payer: Medicare Other

## 2017-04-19 ENCOUNTER — Emergency Department: Payer: Medicare Other

## 2017-04-19 ENCOUNTER — Inpatient Hospital Stay
Admission: EM | Admit: 2017-04-19 | Discharge: 2017-04-21 | DRG: 291 | Disposition: A | Discharge status: Home / Self Care | Payer: Medicare Other | Attending: Internal Medicine | Admitting: Internal Medicine

## 2017-04-19 DIAGNOSIS — I509 Heart failure, unspecified: Secondary | ICD-10-CM

## 2017-04-19 DIAGNOSIS — I959 Hypotension, unspecified: Secondary | ICD-10-CM

## 2017-04-19 DIAGNOSIS — Z9889 Other specified postprocedural states: Secondary | ICD-10-CM

## 2017-04-19 DIAGNOSIS — Z841 Family history of disorders of kidney and ureter: Secondary | ICD-10-CM | POA: Diagnosis not present

## 2017-04-19 DIAGNOSIS — E440 Moderate protein-calorie malnutrition: Secondary | ICD-10-CM | POA: Diagnosis present

## 2017-04-19 DIAGNOSIS — Z9842 Cataract extraction status, left eye: Secondary | ICD-10-CM | POA: Diagnosis not present

## 2017-04-19 DIAGNOSIS — E669 Obesity, unspecified: Secondary | ICD-10-CM | POA: Diagnosis present

## 2017-04-19 DIAGNOSIS — I5033 Acute on chronic diastolic (congestive) heart failure: Secondary | ICD-10-CM | POA: Diagnosis present

## 2017-04-19 DIAGNOSIS — G4733 Obstructive sleep apnea (adult) (pediatric): Secondary | ICD-10-CM | POA: Diagnosis present

## 2017-04-19 DIAGNOSIS — J9621 Acute and chronic respiratory failure with hypoxia: Secondary | ICD-10-CM | POA: Diagnosis present

## 2017-04-19 DIAGNOSIS — Z66 Do not resuscitate: Secondary | ICD-10-CM | POA: Diagnosis present

## 2017-04-19 DIAGNOSIS — N186 End stage renal disease: Secondary | ICD-10-CM | POA: Diagnosis present

## 2017-04-19 DIAGNOSIS — J449 Chronic obstructive pulmonary disease, unspecified: Secondary | ICD-10-CM | POA: Diagnosis present

## 2017-04-19 DIAGNOSIS — R0989 Other specified symptoms and signs involving the circulatory and respiratory systems: Secondary | ICD-10-CM | POA: Diagnosis present

## 2017-04-19 DIAGNOSIS — Z961 Presence of intraocular lens: Secondary | ICD-10-CM | POA: Diagnosis present

## 2017-04-19 DIAGNOSIS — J811 Chronic pulmonary edema: Secondary | ICD-10-CM | POA: Diagnosis present

## 2017-04-19 DIAGNOSIS — K219 Gastro-esophageal reflux disease without esophagitis: Secondary | ICD-10-CM | POA: Diagnosis present

## 2017-04-19 DIAGNOSIS — I255 Ischemic cardiomyopathy: Secondary | ICD-10-CM | POA: Diagnosis present

## 2017-04-19 DIAGNOSIS — Z79899 Other long term (current) drug therapy: Secondary | ICD-10-CM

## 2017-04-19 DIAGNOSIS — Z6823 Body mass index (BMI) 23.0-23.9, adult: Secondary | ICD-10-CM

## 2017-04-19 DIAGNOSIS — N2581 Secondary hyperparathyroidism of renal origin: Secondary | ICD-10-CM | POA: Diagnosis present

## 2017-04-19 DIAGNOSIS — M1990 Unspecified osteoarthritis, unspecified site: Secondary | ICD-10-CM | POA: Diagnosis present

## 2017-04-19 DIAGNOSIS — R252 Cramp and spasm: Secondary | ICD-10-CM | POA: Diagnosis present

## 2017-04-19 DIAGNOSIS — Z992 Dependence on renal dialysis: Secondary | ICD-10-CM

## 2017-04-19 DIAGNOSIS — I2510 Atherosclerotic heart disease of native coronary artery without angina pectoris: Secondary | ICD-10-CM | POA: Diagnosis present

## 2017-04-19 DIAGNOSIS — Z951 Presence of aortocoronary bypass graft: Secondary | ICD-10-CM

## 2017-04-19 DIAGNOSIS — Z9841 Cataract extraction status, right eye: Secondary | ICD-10-CM | POA: Diagnosis not present

## 2017-04-19 DIAGNOSIS — Z9981 Dependence on supplemental oxygen: Secondary | ICD-10-CM | POA: Diagnosis not present

## 2017-04-19 DIAGNOSIS — J90 Pleural effusion, not elsewhere classified: Secondary | ICD-10-CM

## 2017-04-19 DIAGNOSIS — Z96652 Presence of left artificial knee joint: Secondary | ICD-10-CM | POA: Diagnosis present

## 2017-04-19 DIAGNOSIS — D631 Anemia in chronic kidney disease: Secondary | ICD-10-CM | POA: Diagnosis present

## 2017-04-19 DIAGNOSIS — Z87442 Personal history of urinary calculi: Secondary | ICD-10-CM

## 2017-04-19 DIAGNOSIS — E11649 Type 2 diabetes mellitus with hypoglycemia without coma: Secondary | ICD-10-CM | POA: Diagnosis present

## 2017-04-19 DIAGNOSIS — Z91041 Radiographic dye allergy status: Secondary | ICD-10-CM

## 2017-04-19 DIAGNOSIS — Z87891 Personal history of nicotine dependence: Secondary | ICD-10-CM

## 2017-04-19 DIAGNOSIS — E1122 Type 2 diabetes mellitus with diabetic chronic kidney disease: Secondary | ICD-10-CM | POA: Diagnosis present

## 2017-04-19 DIAGNOSIS — I132 Hypertensive heart and chronic kidney disease with heart failure and with stage 5 chronic kidney disease, or end stage renal disease: Secondary | ICD-10-CM | POA: Diagnosis present

## 2017-04-19 DIAGNOSIS — Z8249 Family history of ischemic heart disease and other diseases of the circulatory system: Secondary | ICD-10-CM

## 2017-04-19 DIAGNOSIS — Z7982 Long term (current) use of aspirin: Secondary | ICD-10-CM

## 2017-04-19 DIAGNOSIS — Z794 Long term (current) use of insulin: Secondary | ICD-10-CM

## 2017-04-19 DIAGNOSIS — Z888 Allergy status to other drugs, medicaments and biological substances status: Secondary | ICD-10-CM

## 2017-04-19 LAB — PHOSPHORUS: Phosphorus: 5.4 mg/dL — ABNORMAL HIGH (ref 2.5–4.6)

## 2017-04-19 LAB — GLUCOSE, CAPILLARY
GLUCOSE-CAPILLARY: 203 mg/dL — AB (ref 65–99)
Glucose-Capillary: 187 mg/dL — ABNORMAL HIGH (ref 65–99)

## 2017-04-19 LAB — URINALYSIS, COMPLETE (UACMP) WITH MICROSCOPIC
Bacteria, UA: NONE SEEN
Bilirubin Urine: NEGATIVE
Ketones, ur: NEGATIVE mg/dL
LEUKOCYTES UA: NEGATIVE
Nitrite: NEGATIVE
PH: 5 (ref 5.0–8.0)
Protein, ur: 100 mg/dL — AB
SPECIFIC GRAVITY, URINE: 1.012 (ref 1.005–1.030)

## 2017-04-19 LAB — COMPREHENSIVE METABOLIC PANEL
ALK PHOS: 55 U/L (ref 38–126)
ALT: 6 U/L — ABNORMAL LOW (ref 17–63)
ANION GAP: 8 (ref 5–15)
AST: 12 U/L — ABNORMAL LOW (ref 15–41)
Albumin: 2.8 g/dL — ABNORMAL LOW (ref 3.5–5.0)
BUN: 41 mg/dL — ABNORMAL HIGH (ref 6–20)
CALCIUM: 7.9 mg/dL — AB (ref 8.9–10.3)
CO2: 30 mmol/L (ref 22–32)
Chloride: 98 mmol/L — ABNORMAL LOW (ref 101–111)
Creatinine, Ser: 4.62 mg/dL — ABNORMAL HIGH (ref 0.61–1.24)
GFR, EST AFRICAN AMERICAN: 12 mL/min — AB (ref >60)
GFR, EST NON AFRICAN AMERICAN: 10 mL/min — AB (ref >60)
Glucose, Bld: 171 mg/dL — ABNORMAL HIGH (ref 65–99)
Potassium: 4.6 mmol/L (ref 3.5–5.1)
SODIUM: 136 mmol/L (ref 135–145)
Total Bilirubin: 0.4 mg/dL (ref 0.3–1.2)
Total Protein: 7.1 g/dL (ref 6.5–8.1)

## 2017-04-19 LAB — CBC WITH DIFFERENTIAL/PLATELET
BASOS ABS: 0 10*3/uL (ref 0–0.1)
BASOS PCT: 1 %
EOS PCT: 2 %
Eosinophils Absolute: 0.1 10*3/uL (ref 0–0.7)
HCT: 29.3 % — ABNORMAL LOW (ref 40.0–52.0)
Hemoglobin: 9.1 g/dL — ABNORMAL LOW (ref 13.0–18.0)
Lymphocytes Relative: 19 %
Lymphs Abs: 1.1 10*3/uL (ref 1.0–3.6)
MCH: 30.1 pg (ref 26.0–34.0)
MCHC: 30.9 g/dL — ABNORMAL LOW (ref 32.0–36.0)
MCV: 97.3 fL (ref 80.0–100.0)
MONO ABS: 0.5 10*3/uL (ref 0.2–1.0)
Monocytes Relative: 9 %
Neutro Abs: 4.1 10*3/uL (ref 1.4–6.5)
Neutrophils Relative %: 69 %
PLATELETS: 366 10*3/uL (ref 150–440)
RBC: 3.01 MIL/uL — ABNORMAL LOW (ref 4.40–5.90)
RDW: 20.7 % — AB (ref 11.5–14.5)
WBC: 5.9 10*3/uL (ref 3.8–10.6)

## 2017-04-19 LAB — TROPONIN I: TROPONIN I: 0.05 ng/mL — AB (ref <0.03)

## 2017-04-19 LAB — LACTIC ACID, PLASMA
LACTIC ACID, VENOUS: 0.9 mmol/L (ref 0.5–1.9)
Lactic Acid, Venous: 0.6 mmol/L (ref 0.5–1.9)

## 2017-04-19 LAB — PROTIME-INR
INR: 1.07
Prothrombin Time: 13.8 seconds (ref 11.4–15.2)

## 2017-04-19 LAB — MRSA PCR SCREENING: MRSA BY PCR: NEGATIVE

## 2017-04-19 MED ORDER — INSULIN ASPART 100 UNIT/ML ~~LOC~~ SOLN
0.0000 [IU] | Freq: Three times a day (TID) | SUBCUTANEOUS | Status: DC
  Administered 2017-04-21: 1 [IU] via SUBCUTANEOUS
  Filled 2017-04-19: qty 1

## 2017-04-19 MED ORDER — ONDANSETRON HCL 4 MG/2ML IJ SOLN: 4.0000 mg | Freq: Four times a day (QID) | INTRAMUSCULAR | Status: DC | PRN

## 2017-04-19 MED ORDER — SODIUM CHLORIDE 0.9 % IV SOLN: 100.0000 mL | INTRAVENOUS | Status: DC | PRN

## 2017-04-19 MED ORDER — HEPARIN SODIUM (PORCINE) 5000 UNIT/ML IJ SOLN
5000.0000 [IU] | Freq: Three times a day (TID) | INTRAMUSCULAR | Status: DC
  Administered 2017-04-19 – 2017-04-21 (×4): 5000 [IU] via SUBCUTANEOUS
  Filled 2017-04-19 (×6): qty 1

## 2017-04-19 MED ORDER — TORSEMIDE 20 MG PO TABS: 100.0000 mg | ORAL_TABLET | ORAL | Status: DC

## 2017-04-19 MED ORDER — ACETAMINOPHEN 650 MG RE SUPP: 650.0000 mg | Freq: Four times a day (QID) | RECTAL | Status: DC | PRN

## 2017-04-19 MED ORDER — LOSARTAN POTASSIUM 50 MG PO TABS
50.0000 mg | ORAL_TABLET | Freq: Every day | ORAL | Status: DC
  Filled 2017-04-19: qty 1

## 2017-04-19 MED ORDER — FERROUS SULFATE 325 (65 FE) MG PO TABS
325.0000 mg | ORAL_TABLET | Freq: Every day | ORAL | Status: DC
  Administered 2017-04-20 – 2017-04-21 (×2): 325 mg via ORAL
  Filled 2017-04-19 (×2): qty 1

## 2017-04-19 MED ORDER — TIOTROPIUM BROMIDE MONOHYDRATE 18 MCG IN CAPS
1.0000 | ORAL_CAPSULE | Freq: Every day | RESPIRATORY_TRACT | Status: DC
  Administered 2017-04-21: 18 ug via RESPIRATORY_TRACT
  Filled 2017-04-19: qty 5

## 2017-04-19 MED ORDER — DOCUSATE SODIUM 100 MG PO CAPS
100.0000 mg | ORAL_CAPSULE | Freq: Two times a day (BID) | ORAL | Status: DC
  Administered 2017-04-19 – 2017-04-21 (×4): 100 mg via ORAL
  Filled 2017-04-19 (×4): qty 1

## 2017-04-19 MED ORDER — ORAL CARE MOUTH RINSE
15.0000 mL | Freq: Two times a day (BID) | OROMUCOSAL | Status: DC
  Administered 2017-04-19 – 2017-04-20 (×2): 15 mL via OROMUCOSAL

## 2017-04-19 MED ORDER — SODIUM CHLORIDE 0.9 % IV BOLUS (SEPSIS)
500.0000 mL | Freq: Once | INTRAVENOUS | Status: AC
  Administered 2017-04-19: 500 mL via INTRAVENOUS

## 2017-04-19 MED ORDER — PENTAFLUOROPROP-TETRAFLUOROETH EX AERO: 1.0000 "application " | INHALATION_SPRAY | CUTANEOUS | Status: DC | PRN

## 2017-04-19 MED ORDER — INSULIN DETEMIR 100 UNIT/ML ~~LOC~~ SOLN
30.0000 [IU] | Freq: Two times a day (BID) | SUBCUTANEOUS | Status: DC
  Administered 2017-04-19: 30 [IU] via SUBCUTANEOUS
  Filled 2017-04-19 (×4): qty 0.3

## 2017-04-19 MED ORDER — ONDANSETRON HCL 4 MG PO TABS: 4.0000 mg | ORAL_TABLET | Freq: Four times a day (QID) | ORAL | Status: DC | PRN

## 2017-04-19 MED ORDER — TAMSULOSIN HCL 0.4 MG PO CAPS
0.4000 mg | ORAL_CAPSULE | Freq: Every day | ORAL | Status: DC
  Administered 2017-04-20 – 2017-04-21 (×2): 0.4 mg via ORAL
  Filled 2017-04-19 (×2): qty 1

## 2017-04-19 MED ORDER — HEPARIN SODIUM (PORCINE) 1000 UNIT/ML DIALYSIS
1000.0000 [IU] | INTRAMUSCULAR | Status: DC | PRN
  Filled 2017-04-19: qty 1

## 2017-04-19 MED ORDER — ACETAMINOPHEN 325 MG PO TABS
650.0000 mg | ORAL_TABLET | Freq: Four times a day (QID) | ORAL | Status: DC | PRN
  Administered 2017-04-20: 650 mg via ORAL
  Filled 2017-04-19: qty 2

## 2017-04-19 MED ORDER — ALBUTEROL SULFATE (2.5 MG/3ML) 0.083% IN NEBU: 2.5000 mg | INHALATION_SOLUTION | RESPIRATORY_TRACT | Status: DC | PRN

## 2017-04-19 MED ORDER — RENA-VITE PO TABS
1.0000 | ORAL_TABLET | Freq: Every evening | ORAL | Status: DC
  Administered 2017-04-19 – 2017-04-20 (×2): 1 via ORAL
  Filled 2017-04-19 (×3): qty 1

## 2017-04-19 MED ORDER — TRAZODONE HCL 50 MG PO TABS
50.0000 mg | ORAL_TABLET | Freq: Every day | ORAL | Status: DC
  Administered 2017-04-19 – 2017-04-20 (×2): 50 mg via ORAL
  Filled 2017-04-19 (×2): qty 1

## 2017-04-19 MED ORDER — PRAVASTATIN SODIUM 20 MG PO TABS
20.0000 mg | ORAL_TABLET | Freq: Every day | ORAL | Status: DC
  Administered 2017-04-19 – 2017-04-20 (×2): 20 mg via ORAL
  Filled 2017-04-19 (×2): qty 1

## 2017-04-19 MED ORDER — ASPIRIN EC 81 MG PO TBEC
81.0000 mg | DELAYED_RELEASE_TABLET | Freq: Every day | ORAL | Status: DC
  Administered 2017-04-19 – 2017-04-20 (×2): 81 mg via ORAL
  Filled 2017-04-19 (×3): qty 1

## 2017-04-19 MED ORDER — SERTRALINE HCL 50 MG PO TABS
25.0000 mg | ORAL_TABLET | Freq: Every day | ORAL | Status: DC
  Administered 2017-04-19 – 2017-04-21 (×3): 25 mg via ORAL
  Filled 2017-04-19 (×3): qty 1

## 2017-04-19 MED ORDER — POLYETHYLENE GLYCOL 3350 17 G PO PACK: 17.0000 g | PACK | Freq: Every day | ORAL | Status: DC | PRN

## 2017-04-19 MED ORDER — METOPROLOL SUCCINATE ER 25 MG PO TB24
25.0000 mg | ORAL_TABLET | Freq: Every day | ORAL | Status: DC
  Filled 2017-04-19: qty 1

## 2017-04-19 MED ORDER — LIDOCAINE HCL (PF) 1 % IJ SOLN
5.0000 mL | INTRAMUSCULAR | Status: DC | PRN
Start: 2017-04-19 — End: 2017-04-21
  Filled 2017-04-19: qty 5

## 2017-04-19 MED ORDER — HYDROXYZINE HCL 10 MG/5ML PO SYRP
10.0000 mg | ORAL_SOLUTION | Freq: Three times a day (TID) | ORAL | Status: DC | PRN
  Filled 2017-04-19: qty 5

## 2017-04-19 MED ORDER — ALTEPLASE 2 MG IJ SOLR
2.0000 mg | Freq: Once | INTRAMUSCULAR | Status: DC | PRN
  Filled 2017-04-19: qty 2

## 2017-04-19 MED ORDER — CALCIUM ACETATE (PHOS BINDER) 667 MG PO CAPS
2001.0000 mg | ORAL_CAPSULE | Freq: Three times a day (TID) | ORAL | Status: DC
  Administered 2017-04-20 – 2017-04-21 (×2): 2001 mg via ORAL
  Filled 2017-04-19 (×2): qty 3

## 2017-04-19 MED ORDER — MEGESTROL ACETATE 20 MG PO TABS
20.0000 mg | ORAL_TABLET | Freq: Every day | ORAL | Status: DC
  Administered 2017-04-20 – 2017-04-21 (×2): 20 mg via ORAL
  Filled 2017-04-19 (×2): qty 1

## 2017-04-19 MED ORDER — LIDOCAINE-PRILOCAINE 2.5-2.5 % EX CREA: 1.0000 "application " | TOPICAL_CREAM | CUTANEOUS | Status: DC | PRN

## 2017-04-19 MED ORDER — CINACALCET HCL 30 MG PO TABS
30.0000 mg | ORAL_TABLET | Freq: Every day | ORAL | Status: DC
  Administered 2017-04-20 – 2017-04-21 (×2): 30 mg via ORAL
  Filled 2017-04-19 (×2): qty 1

## 2017-04-19 NOTE — Progress Notes (Signed)
Family Meeting Note  Advance Directive:{yes  Today a meeting took place with the patient and wife in the ER.  The following were discussed:Patient's diagnosis: , Patient's progosis: patient is being admitted with recurrent bilateral pleural effusions and acute on chronic congestive heart failure diastolic. He has multiple comorbidities. Discussed wound care and treatment. Discussed CODE STATUS. Patient is a DO NOT RESUSCITATE. Wife was present in the ER.   Time spent during discussion 16 mins  Mikalia Fessel, MD

## 2017-04-19 NOTE — Procedures (Signed)
US right thoracentesis without difficulty  Complications:  None  Blood Loss: none  See dictation in canopy pacs  

## 2017-04-19 NOTE — Progress Notes (Signed)
Pre hd 

## 2017-04-19 NOTE — ED Triage Notes (Signed)
Pt sent to ER from dialysis because blood pressure found to be low. All other VS WNL. No pain. Pt alert and oriented X4, active, cooperative, pt in NAD. RR even and unlabored, color WNL.  18G to R AC by EMS. No dialysis received today.

## 2017-04-19 NOTE — H&P (Signed)
[facility] [place] at [facility]   PATIENT NAME: Kenneth [name]    MR#:  [id number]  DATE OF BIRTH:  December 02, 1927  DATE OF ADMISSION:  04/19/2017  PRIMARY CARE PHYSICIAN: Barbette Reichmann, MD   REQUESTING/REFERRING PHYSICIAN: Dr. Alphonzo Lemmings  CHIEF COMPLAINT:  Blood pressure was low this morning at dialysis  HISTORY OF PRESENT ILLNESS:  Kenneth [name]  is a 83 y.o. male with a known history of coronary artery disease status post CABG, end-stage renal disease on hemodialysis Monday Wednesday Friday, hypertension, COPD on chronic 2 L nasal cannula oxygen with long-standing history of smoking, recurrent pleural effusion secondary to hypovolemia from diastolic congestive heart failure comes to the emergency room after he was found to be hypotensive with blood pressure in the 60s when he went to dialysis. Patient has been complaining of increasing shortness of breath. He is breathing through his mouth. He uses his oxygen at home. He was found to have bilateral pleural effusion and pulmonary vascular congestion. Patient is being admitted with acute on chronic hypoxic respiratory failure with acute on chronic diastolic congestive heart failure and bilateral recurrent pleural effusion.  PAST MEDICAL HISTORY:   Past Medical History:  Diagnosis Date  . Anemia   . Anxiety   . Arthritis   . B12 deficiency   . Coronary artery disease    a. 2vCABG LIMA-LAD, SVG-OM1 in 1999; b. Lexiscan myoview (5/10) showed EF 45%, HK apex & lateral wall, fixed perfusion defect involving apex, inferoapex, & inferolateral wall, also some inferoapical ischemia; LHC (5/10) showed patent LIMA-LAD and totally occluded SVG-OM1, 99% pLAD, 40% ostial CFX, 40% mCFX, 30% OM1, 20% pRCA  . Diabetes mellitus   . ESRD on hemodialysis (HCC)    a. once weekly as of 08/2015. Currently 3xweek since 09/11/15.  Marland Kitchen GERD (gastroesophageal reflux disease)   . History of MRI of chest 11/10   a. Cardiac; EF 48%, mid  anterolateral and inferolateral walls, apical lateral wall, apical septal wall, and true apex were all think and akinetic; 51-75% thickness subendocardial delayed enhancement in mid anterolateral and inferolateral walls, full thickness enhancement in apical laterla and apical septal walls and true apex  . Hyperlipidemia   . Hypertension   . Ischemic cardiomyopathy    a. likely ICM, EF 55-60%, RWMA could not be excluded, nl PASP, nl CVP  . Nephrolithiasis   . Obesity   . Occasional tremors   . OSA (obstructive sleep apnea)    Moderate CPAP  . Shortness of breath dyspnea   . Spinal stenosis     PAST SURGICAL HISTOIRY:   Past Surgical History:  Procedure Laterality Date  . BACK SURGERY     Multiple, with spinal stenosis  . BASCILIC VEIN TRANSPOSITION Left 12/11/2015   Procedure: BASCILIC VEIN TRANSPOSITION ( STAGE 1 );  Surgeon: Renford Dills, MD;  Location: [facility] ORS;  Service: Vascular;  Laterality: Left;  . CARDIAC CATHETERIZATION    . CATARACT EXTRACTION    . CORONARY ARTERY BYPASS GRAFT  1999   In [place] with LIMA-LAD and SVG-OM1  . EYE SURGERY Bilateral    Cataract extraction with IOL  . INSERTION OF DIALYSIS CATHETER Right    Dr. Wyn Quaker, [facility]  . JOINT REPLACEMENT Left    Total Knee Replacement  . PERIPHERAL VASCULAR CATHETERIZATION N/A 09/01/2015   Procedure: Dialysis/Perma Catheter Insertion;  Surgeon: Renford Dills, MD;  Location: [facility] INVASIVE CV LAB;  Service: Cardiovascular;  Laterality: N/A;  . PERIPHERAL VASCULAR CATHETERIZATION Left 02/25/2016  Procedure: Dialysis/Perma Catheter Removal;  Surgeon: Annice NeedyJason S Dew, MD;  Location: [facility] INVASIVE CV LAB;  Service: Cardiovascular;  Laterality: Left;  . PERIPHERAL VASCULAR CATHETERIZATION Left 03/22/2016   Procedure: A/V Shuntogram/Fistulagram;  Surgeon: Renford DillsGregory G Schnier, MD;  Location: [facility] INVASIVE CV LAB;  Service: Cardiovascular;  Laterality: Left;  . TOTAL KNEE ARTHROPLASTY  5/10    SOCIAL HISTORY:   Social  History  Substance Use Topics  . Smoking status: Former Smoker    Types: Cigarettes  . Smokeless tobacco: Never Used  . Alcohol use No    FAMILY HISTORY:   Family History  Problem Relation Age of Onset  . Kidney failure Mother   . Heart failure Father     DRUG ALLERGIES:   Allergies  Allergen Reactions  . Bee Venom   . Beef-Derived Products Hives  . Iodine Hives  . Ivp Dye [Iodinated Diagnostic Agents] Hives    REVIEW OF SYSTEMS:  Review of Systems  Constitutional: Negative for chills, fever and weight loss.  HENT: Negative for ear discharge, ear pain and nosebleeds.   Eyes: Negative for blurred vision, pain and discharge.  Respiratory: Positive for shortness of breath. Negative for sputum production, wheezing and stridor.   Cardiovascular: Positive for orthopnea and PND. Negative for chest pain and palpitations.  Gastrointestinal: Negative for abdominal pain, diarrhea, nausea and vomiting.  Genitourinary: Negative for frequency and urgency.  Musculoskeletal: Negative for back pain and joint pain.  Neurological: Positive for weakness. Negative for sensory change, speech change and focal weakness.  Psychiatric/Behavioral: Negative for depression and hallucinations. The patient is not nervous/anxious.      MEDICATIONS AT HOME:   Prior to Admission medications   Medication Sig Start Date End Date Taking? Authorizing Provider  aspirin EC 81 MG tablet Take 81 mg by mouth daily.   Yes [provider]  B Complex-C-Folic Acid (VOL-CARE RX) 1 MG TABS Take 1 tablet by mouth every evening.  12/15/16  Yes [provider]  calcium acetate (PHOSLO) 667 MG capsule Take 2,001 mg by mouth 3 (three) times daily.  06/17/16  Yes [provider]  cinacalcet (SENSIPAR) 30 MG tablet Take 1 tablet by mouth daily. 12/17/15  Yes [provider]  ferrous sulfate 325 (65 FE) MG tablet Take 325 mg by mouth daily.   Yes [provider]  hydrocortisone  valerate ointment (WESTCORT) 0.2 % Apply to affected area daily 06/20/16 06/20/17 Yes Joni ReiningSmith, Ronald K, PA-C  hydrOXYzine (ATARAX) 10 MG/5ML syrup Take 5 mLs (10 mg total) by mouth 3 (three) times daily as needed for itching. 06/20/16  Yes Joni ReiningSmith, Ronald K, PA-C  Insulin Detemir (LEVEMIR FLEXTOUCH) 100 UNIT/ML Pen Inject 30-70 Units into the skin 2 (two) times daily. 70 units in AM, 30 units in PM   Yes [provider]  lidocaine-prilocaine (EMLA) cream Apply 2.5 application topically as needed.   Yes [provider]  lisinopril (PRINIVIL,ZESTRIL) 20 MG tablet Take 1 tablet by mouth daily.    Yes [provider]  losartan (COZAAR) 50 MG tablet Take 50 mg by mouth daily.   Yes [provider]  lovastatin (MEVACOR) 20 MG tablet Take 20 mg by mouth at bedtime.   Yes [provider]  megestrol (MEGACE) 20 MG tablet Take 20 mg by mouth daily.   Yes [provider]  nitroGLYCERIN (NITROSTAT) 0.4 MG SL tablet Place 1 tablet (0.4 mg total) under the tongue every 5 (five) minutes as needed for chest pain. 08/17/16 04/19/18 Yes  Antonieta Iba, MD  sertraline (ZOLOFT) 25 MG tablet Take 1 tablet by mouth daily.  12/07/16  Yes [provider]  SPIRIVA HANDIHALER 18 MCG inhalation capsule Place 1 capsule into inhaler and inhale daily. 05/11/16  Yes [provider]  tamsulosin (FLOMAX) 0.4 MG CAPS capsule Take 1 capsule (0.4 mg total) by mouth daily. 09/04/15  Yes Gouru, Aruna, MD  TOPROL XL 25 MG 24 hr tablet Take 25 mg by mouth daily.  04/12/17  Yes [provider]  torsemide (DEMADEX) 100 MG tablet Take 1 tablet (100 mg total) by mouth daily. Take on nondialysis days (Tues,Thurs, Sat, Sun) 01/24/17  Yes Gollan, Tollie Pizza, MD  traZODone (DESYREL) 50 MG tablet Take 1 tablet by mouth at bedtime. 05/17/16  Yes [provider]      VITAL SIGNS:  Blood pressure (!) 112/59, pulse 69, temperature 98.1 F (36.7 C), temperature source  Oral, resp. rate 20, height 5\' 10"  (1.778 m), weight 77.8 kg (171 lb 9.6 oz), SpO2 100 %.  PHYSICAL EXAMINATION:  GENERAL:  83 y.o.-year-old patient lying in the bed with mild acute respiratorydistress.  EYES: Pupils equal, round, reactive to light and accommodation. No scleral icterus. Extraocular muscles intact.  HEENT: Head atraumatic, normocephalic. Oropharynx and nasopharynx clear.  NECK:  Supple, no jugular venous distention. No thyroid enlargement, no tenderness.  LUNGS: decreasedbreath sounds bilaterally, no wheezing, rales,rhonchi or crepitation. No use of accessory muscles of respiration.  CARDIOVASCULAR: S1, S2 normal. No murmurs, rubs, or gallops.  ABDOMEN: Soft, nontender, nondistended. Bowel sounds present. No organomegaly or mass.  EXTREMITIES: No pedal edema, cyanosis, or clubbing.  NEUROLOGIC: Cranial nerves II through XII are intact. Muscle strength 5/5 in all extremities. Sensation intact. Gait not checked.  PSYCHIATRIC: The patient is alert and oriented x 3.  SKIN: No obvious rash, lesion, or ulcer.   LABORATORY PANEL:   CBC  Recent Labs Lab 04/19/17 0948  WBC 5.9  HGB 9.1*  HCT 29.3*  PLT 366   ------------------------------------------------------------------------------------------------------------------  Chemistries   Recent Labs Lab 04/19/17 0948  NA 136  K 4.6  CL 98*  CO2 30  GLUCOSE 171*  BUN 41*  CREATININE 4.62*  CALCIUM 7.9*  AST 12*  ALT 6*  ALKPHOS 55  BILITOT 0.4   ------------------------------------------------------------------------------------------------------------------  Cardiac Enzymes  Recent Labs Lab 04/19/17 0948  TROPONINI 0.05*   ------------------------------------------------------------------------------------------------------------------  RADIOLOGY:  Dg Chest 2 View  Result Date: 04/19/2017 CLINICAL DATA:  O tentative at dialysis. Slight cough and sore throat. No shortness of breath or chest pain. EXAM:  CHEST  2 VIEW COMPARISON:  Chest x-ray of March 01, 2017 FINDINGS: The lungs are well-expanded. There are moderate size bilateral pleural effusions which have increased in size since the previous study. The left-sided pneumothorax has resolved. The cardiac silhouette is enlarged. The central pulmonary vascularity is prominent. There are are post CABG changes. There is calcification in the wall of the aortic arch. IMPRESSION: Moderate-sized bilateral pleural effusions which have increased in volume since the previous study. Cardiomegaly and mild pulmonary interstitial edema consistent with CHF. No residual pneumothorax on the left. Thoracic aortic atherosclerosis. Electronically Signed   By: David  [place] M.D.   On: 04/19/2017 10:23    EKG:    IMPRESSION AND PLAN:   Kenneth [name]  is a 83 y.o. male with a known history of coronary artery disease status post CABG, end-stage renal disease on hemodialysis Monday Wednesday Friday, hypertension, COPD on chronic 2 L nasal cannula oxygen with long-standing  history of smoking, recurrent pleural effusion secondary to hypovolemia from diastolic congestive heart failure comes to the emergency room after he was found to be hypotensive with blood pressure in the 60s when he went to dialysis.  1. Acute on chronic respiratory failure secondary to acute on chronic congestive heart failure diastolic with bilateral pulmonary vascular congestion one in the lower and moderate bilateral pleural effusions -Admit to telemetry -Nephrology consultation for Hemodialysis -Spoke with Dr. Karle Starch from IR to get thoracentesis done -patient currently is not taking his torsemide secondary to cramping in his legs -Cardiac consultation if needed--- he follows with Dr. Mariah Milling.  2. Coronary artery disease status post CABG -Continue home meds with aspirin and statins  3. ESRD on dialysis -Spoke with Dr. Cherylann Ratel who scheduled patient for dialysis today  4. Type 2 diabetes--insulin  requiring -Continue home dose insulin and sliding scale  5. COPD chronic oxygen requiring -Continue oxygen, nebulizers  6. CODE STATUS DO NOT RESUSCITATE Discussed with patient and wife    All the records are reviewed and case discussed with ED provider. Management plans discussed with the patient, family and they are in agreement.  CODE STATUS: DO NOT RESUSCITATE  TOTAL TIME TAKING CARE OF THIS PATIENT: *50* minutes.    Esaul Dorwart M.D on 04/19/2017 at 11:33 AM  Between 7am to 6pm - Pager - [phone]  After 6pm go to [email or web address] - password [email or web address] [facility]  [facility]  Office  [phone]  CC: Primary care physician; Barbette Reichmann, MD

## 2017-04-19 NOTE — Progress Notes (Signed)
Post dialysis 

## 2017-04-19 NOTE — ED Provider Notes (Addendum)
[facility] Emergency Department Provider Note  ____________________________________________   I have reviewed the triage vital signs and the nursing notes.   HISTORY  Chief Complaint Hypotension    HPI Kenneth [name] is a 83 y.o. male Patient states that he has had no recent change in medications, or bleeding presents today complaining of low blood pressure. He was going to dialysis and they did not dialyze him because his pressure was in the low 80s. When EMS to know if his pressure went down to 79. He had no chest pain or shortness of breath. He denies nausea or vomiting or melena or bright red blood per rectum or hematemesis, he states that he has had a slight cough but he has not had increased oxygen demand over his normal 2 L. He also has a slight sore throat. Otherwise he has no complaints or concerns.     Past Medical History:  Diagnosis Date  . Anemia   . Anxiety   . Arthritis   . B12 deficiency   . Coronary artery disease    a. 2vCABG LIMA-LAD, SVG-OM1 in 1999; b. Lexiscan myoview (5/10) showed EF 45%, HK apex & lateral wall, fixed perfusion defect involving apex, inferoapex, & inferolateral wall, also some inferoapical ischemia; LHC (5/10) showed patent LIMA-LAD and totally occluded SVG-OM1, 99% pLAD, 40% ostial CFX, 40% mCFX, 30% OM1, 20% pRCA  . Diabetes mellitus   . ESRD on hemodialysis (HCC)    a. once weekly as of 08/2015. Currently 3xweek since 09/11/15.  Marland Kitchen GERD (gastroesophageal reflux disease)   . History of MRI of chest 11/10   a. Cardiac; EF 48%, mid anterolateral and inferolateral walls, apical lateral wall, apical septal wall, and true apex were all think and akinetic; 51-75% thickness subendocardial delayed enhancement in mid anterolateral and inferolateral walls, full thickness enhancement in apical laterla and apical septal walls and true apex  . Hyperlipidemia   . Hypertension   . Ischemic cardiomyopathy    a. likely  ICM, EF 55-60%, RWMA could not be excluded, nl PASP, nl CVP  . Nephrolithiasis   . Obesity   . Occasional tremors   . OSA (obstructive sleep apnea)    Moderate CPAP  . Shortness of breath dyspnea   . Spinal stenosis     Patient Active Problem List   Diagnosis Date Noted  . Coronary artery disease of native artery of native heart with stable angina pectoris (HCC) 01/22/2017  . Pulmonary edema 07/15/2016  . Anemia, unspecified 05/20/2016  . ESRD on dialysis (HCC) 09/28/2015  . Ischemic cardiomyopathy   . CKD (chronic kidney disease) stage 4, GFR 15-29 ml/min (HCC) 08/26/2015  . Sinus tachycardia 08/26/2015  . Demand ischemia (HCC)   . Coronary artery disease involving coronary bypass graft of native heart without angina pectoris   . Ischemic chest pain   . Acute non-ST elevation myocardial infarction (NSTEMI) (HCC) 08/24/2015  . NSTEMI (non-ST elevated myocardial infarction) (HCC)   . Angina pectoris (HCC)   . SOB (shortness of breath)   . Chronic diastolic CHF (congestive heart failure) (HCC)   . Centrilobular emphysema (HCC)   . Coronary artery disease involving coronary bypass graft of native heart with angina pectoris with documented spasm (HCC)   . Mixed hyperlipidemia   . ED (erectile dysfunction) 10/06/2014  . Type 2 diabetes mellitus with chronic kidney disease (HCC) 05/26/2014  . Personal history of other malignant neoplasm of skin 03/19/2012  . OSA on CPAP 08/19/2009  .  DYSPNEA 07/17/2009  . SNORING 07/17/2009  . HYPERLIPIDEMIA-MIXED 07/02/2009  . HYPERTENSION, UNSPECIFIED 07/02/2009  . CORONARY ATHEROSCLEROSIS OF ARTERY BYPASS GRAFT 07/02/2009    Past Surgical History:  Procedure Laterality Date  . BACK SURGERY     Multiple, with spinal stenosis  . BASCILIC VEIN TRANSPOSITION Left 12/11/2015   Procedure: BASCILIC VEIN TRANSPOSITION ( STAGE 1 );  Surgeon: Renford Dills, MD;  Location: [facility] ORS;  Service: Vascular;  Laterality: Left;  . CARDIAC CATHETERIZATION     . CATARACT EXTRACTION    . CORONARY ARTERY BYPASS GRAFT  1999   In [place] with LIMA-LAD and SVG-OM1  . EYE SURGERY Bilateral    Cataract extraction with IOL  . INSERTION OF DIALYSIS CATHETER Right    Dr. Wyn Quaker, [facility]  . JOINT REPLACEMENT Left    Total Knee Replacement  . PERIPHERAL VASCULAR CATHETERIZATION N/A 09/01/2015   Procedure: Dialysis/Perma Catheter Insertion;  Surgeon: Renford Dills, MD;  Location: [facility] INVASIVE CV LAB;  Service: Cardiovascular;  Laterality: N/A;  . PERIPHERAL VASCULAR CATHETERIZATION Left 02/25/2016   Procedure: Dialysis/Perma Catheter Removal;  Surgeon: Annice Needy, MD;  Location: [facility] INVASIVE CV LAB;  Service: Cardiovascular;  Laterality: Left;  . PERIPHERAL VASCULAR CATHETERIZATION Left 03/22/2016   Procedure: A/V Shuntogram/Fistulagram;  Surgeon: Renford Dills, MD;  Location: [facility] INVASIVE CV LAB;  Service: Cardiovascular;  Laterality: Left;  . TOTAL KNEE ARTHROPLASTY  5/10    Prior to Admission medications   Medication Sig Start Date End Date Taking? Authorizing Provider  aspirin EC 81 MG tablet Take 81 mg by mouth daily.    [provider]  B Complex-C-Folic Acid (VOL-CARE RX) 1 MG TABS Take 1 tablet by mouth once. 12/15/16   [provider]  calcium acetate (PHOSLO) 667 MG capsule Take 1 capsule by mouth 3 (three) times daily. 06/17/16   [provider]  cinacalcet (SENSIPAR) 30 MG tablet Take 1 tablet by mouth daily. 12/17/15   [provider]  hydrocortisone valerate ointment (WESTCORT) 0.2 % Apply to affected area daily 06/20/16 06/20/17  Joni Reining, PA-C  hydrOXYzine (ATARAX) 10 MG/5ML syrup Take 5 mLs (10 mg total) by mouth 3 (three) times daily as needed for itching. 06/20/16   Joni Reining, PA-C  Insulin Detemir (LEVEMIR FLEXTOUCH) 100 UNIT/ML Pen Inject 18 Units into the skin daily before breakfast. 70 units in AM, 30 units in PM    [provider]  IRON PO Take 1 tablet by mouth 1 day or  1 dose.    [provider]  lidocaine-prilocaine (EMLA) cream Apply 2.5 application topically as needed.    [provider]  lisinopril (PRINIVIL,ZESTRIL) 20 MG tablet Take 1 tablet by mouth 1 day or 1 dose.    [provider]  losartan (COZAAR) 50 MG tablet Take 50 mg by mouth daily.    [provider]  lovastatin (MEVACOR) 20 MG tablet Take 20 mg by mouth at bedtime.    [provider]  metoprolol succinate (TOPROL-XL) 50 MG 24 hr tablet Take 50 mg by mouth daily. Take with or immediately following a meal.    [provider]  nitroGLYCERIN (NITROSTAT) 0.4 MG SL tablet Place 1 tablet (0.4 mg total) under the tongue every 5 (five) minutes as needed for chest pain. 08/17/16 11/15/16  Antonieta Iba, MD  sertraline (ZOLOFT) 25 MG tablet Take 1 tablet by mouth 1 day or 1 dose. 12/07/16   [provider]  [facility] HANDIHALER 18  MCG inhalation capsule Place 1 capsule into inhaler and inhale daily. 05/11/16   [provider]  tamsulosin (FLOMAX) 0.4 MG CAPS capsule Take 1 capsule (0.4 mg total) by mouth daily. 09/04/15   Ramonita LabGouru, Aruna, MD  torsemide (DEMADEX) 100 MG tablet Take 1 tablet (100 mg total) by mouth daily. Take on nondialysis days (Tues,Thurs, Sat, Sun) 01/24/17   Antonieta IbaGollan, Timothy J, MD  traZODone (DESYREL) 50 MG tablet Take 1 tablet by mouth at bedtime. 05/17/16   [provider]    Allergies Bee venom; Beef-derived products; Iodine; and Ivp dye [iodinated diagnostic agents]  Family History  Problem Relation Age of Onset  . Kidney failure Mother   . Heart failure Father     Social History Social History  Substance Use Topics  . Smoking status: Former Smoker    Types: Cigarettes  . Smokeless tobacco: Never Used  . Alcohol use No    Review of Systems Constitutional: No fever/chills Eyes: No visual changes. ENT: No sore throat. No stiff neck no neck pain Cardiovascular: Denies chest pain. Respiratory:  Denies shortness of breath.positive occasionally productive cough Gastrointestinal:   no vomiting.  No diarrhea.  No constipation. Genitourinary: Negative for dysuria. Musculoskeletal: Negative lower extremity swelling Skin: Negative for rash. Neurological: Negative for severe headaches, focal weakness or numbness.   ____________________________________________   PHYSICAL EXAM:  VITAL SIGNS: ED Triage Vitals  Enc Vitals Group     BP 04/19/17 0946 92/60     Pulse Rate 04/19/17 0946 73     Resp 04/19/17 0946 18     Temp 04/19/17 0946 98.1 F (36.7 C)     Temp Source 04/19/17 0946 Oral     SpO2 04/19/17 0946 100 %     Weight 04/19/17 0947 171 lb 9.6 oz (77.8 kg)     Height 04/19/17 0947 5\' 10"  (1.778 m)     Head Circumference --      Peak Flow --      Pain Score --      Pain Loc --      Pain Edu? --      Excl. in GC? --     Constitutional: Alert and oriented. Well appearing and in no acute distress. Eyes: Conjunctivae are normal Head: Atraumatic HEENT: No congestion/rhinnorhea. Mucous membranes are moist.  Oropharynx non-erythematous Neck:   Nontender with no meningismus, no masses, no stridor Cardiovascular: Normal rate, regular rhythm. Grossly normal heart sounds.  Good peripheral circulation. Respiratory: Normal respiratory effort.  No retractions. Ldiminished in the bases bibasilar Abdominal: Soft and nontender. No distention. No guarding no rebound Back:  There is no focal tenderness or step off.  there is no midline tenderness there are no lesions noted. there is no CVA tenderness Musculoskeletal: No lower extremity tenderness, no upper extremity tenderness. No joint effusions, no DVT signs strong distal pulses positive mild bilateral edema Neurologic:  Normal speech and language. No gross focal neurologic deficits are appreciated.  Skin:  Skin is warm, dry and intact. No rash noted. Psychiatric: Mood and affect are normal. Speech and behavior are  normal.  ____________________________________________   LABS (all labs ordered are listed, but only abnormal results are displayed)  Labs Reviewed  CULTURE, BLOOD (ROUTINE X 2)  CULTURE, BLOOD (ROUTINE X 2)  URINE CULTURE  CBC WITH DIFFERENTIAL/PLATELET  PROTIME-INR  COMPREHENSIVE METABOLIC PANEL  TROPONIN I  LACTIC ACID, PLASMA  LACTIC ACID, PLASMA  URINALYSIS, COMPLETE (UACMP) WITH MICROSCOPIC   ____________________________________________  EKG  I personally interpreted  any EKGs ordered by me or triage Sinus rhythm rate 71 bpm no acute ST elevation or depression normal axis, nonspecific ST changes noted. ____________________________________________  RADIOLOGY  I reviewed any imaging ordered by me or triage that were performed during my shift and, if possible, patient and/or family made aware of any abnormal findings. ____________________________________________   PROCEDURES  Procedure(s) performed: None  Procedures  Critical Care performed: None  ____________________________________________   INITIAL IMPRESSION / ASSESSMENT AND PLAN / ED COURSE  Pertinent labs & imaging results that were available during my care of the patient were reviewed by me and considered in my medical decision making (see chart for details).  Patient here with essentially asymptomatic hypertension aside from a mild cough which had to be elicited on review of systems. Possibly a pneumonia, possibly volume depletion. Patient is however scheduled for dialysis today. Did discuss with his on-call nephrologist, who thinks likely patient will be admitted. We will start a broad workup for his low blood pressure. Here, he is only 20 which is not as concerning. We will give him a small bolus of IV fluid as this is dialysis today chest x-ray we'll check a lactic acid, CBC troponin blood cultures and CMP and reassess.    ____________________________________________   FINAL CLINICAL IMPRESSION(S) /  ED DIAGNOSES  Final diagnoses:  None      This chart was dictated using voice recognition software.  Despite best efforts to proofread,  errors can occur which can change meaning.      Jeanmarie Plant, MD 04/19/17 1002    Jeanmarie Plant, MD 04/19/17 1110

## 2017-04-19 NOTE — ED Notes (Signed)
Patient transported to X-ray 

## 2017-04-19 NOTE — Progress Notes (Signed)
[facility]  ROUNDING NOTE   Subjective:  Patient sent here today from dialysis given hypotension. He also reports that he's had progressive shortness of breath over the past several weeks. He is now found to have pulmonary edema as well as moderate pleural effusions. He's had pleural effusions in the past that required thoracentesis. He is also seen Dr. Inez Catalinaakes for this issue and was offered a Pleurx catheter however patient declined. Patient also regularly follows with Dr. Sherryle Lisolan. Patient has also stopped diuretic treatment and is no longer taking Lasix.  Objective:  Vital signs in last 24 hours:  Temp:  [98.1 F (36.7 C)] 98.1 F (36.7 C) (08/29 0946) Pulse Rate:  [73] 73 (08/29 0946) Resp:  [18] 18 (08/29 0946) BP: (92)/(60) 92/60 (08/29 0946) SpO2:  [100 %] 100 % (08/29 0946) Weight:  [77.8 kg (171 lb 9.6 oz)] 77.8 kg (171 lb 9.6 oz) (08/29 0947)  Weight change:  Filed Weights   04/19/17 0947  Weight: 77.8 kg (171 lb 9.6 oz)    Intake/Output: No intake/output data recorded.   Intake/Output this shift:  Total I/O In: 500 [IV Piggyback:500] Out: -   Physical Exam: General: No acute distress  Head: Normocephalic, atraumatic. Moist oral mucosal membranes  Eyes: Anicteric  Neck: Supple, trachea midline  Lungs:  Diminished at bases, on nasal cannula   Heart: S1S2 no rubs  Abdomen:  Soft, nontender, bowel sounds present  Extremities: 2+ peripheral edema.  Neurologic: Awake, alert, following commands  Skin: No lesions  Access: LUE AVF    Basic Metabolic Panel:  Recent Labs Lab 04/19/17 0948  NA 136  K 4.6  CL 98*  CO2 30  GLUCOSE 171*  BUN 41*  CREATININE 4.62*  CALCIUM 7.9*    Liver Function Tests:  Recent Labs Lab 04/19/17 0948  AST 12*  ALT 6*  ALKPHOS 55  BILITOT 0.4  PROT 7.1  ALBUMIN 2.8*   No results for input(s): LIPASE, AMYLASE in the last 168 hours. No results for input(s): AMMONIA in the last 168  hours.  CBC:  Recent Labs Lab 04/19/17 0948  WBC 5.9  NEUTROABS 4.1  HGB 9.1*  HCT 29.3*  MCV 97.3  PLT 366    Cardiac Enzymes:  Recent Labs Lab 04/19/17 0948  TROPONINI 0.05*    BNP: Invalid input(s): POCBNP  CBG: No results for input(s): GLUCAP in the last 168 hours.  Microbiology: Results for orders placed or performed during the hospital encounter of 07/14/16  MRSA PCR Screening     Status: None   Collection Time: 07/15/16  1:41 AM  Result Value Ref Range Status   MRSA by PCR NEGATIVE NEGATIVE Final    Comment:        The GeneXpert MRSA Assay (FDA approved for NASAL specimens only), is one component of a comprehensive MRSA colonization surveillance program. It is not intended to diagnose MRSA infection nor to guide or monitor treatment for MRSA infections.     Coagulation Studies:  Recent Labs  04/19/17 0948  LABPROT 13.8  INR 1.07    Urinalysis: No results for input(s): COLORURINE, LABSPEC, PHURINE, GLUCOSEU, HGBUR, BILIRUBINUR, KETONESUR, PROTEINUR, UROBILINOGEN, NITRITE, LEUKOCYTESUR in the last 72 hours.  Invalid input(s): APPERANCEUR    Imaging: Dg Chest 2 View  Result Date: 04/19/2017 CLINICAL DATA:  O tentative at dialysis. Slight cough and sore throat. No shortness of breath or chest pain. EXAM: CHEST  2 VIEW COMPARISON:  Chest x-ray of March 01, 2017 FINDINGS: The lungs  are well-expanded. There are moderate size bilateral pleural effusions which have increased in size since the previous study. The left-sided pneumothorax has resolved. The cardiac silhouette is enlarged. The central pulmonary vascularity is prominent. There are are post CABG changes. There is calcification in the wall of the aortic arch. IMPRESSION: Moderate-sized bilateral pleural effusions which have increased in volume since the previous study. Cardiomegaly and mild pulmonary interstitial edema consistent with CHF. No residual pneumothorax on the left. Thoracic aortic  atherosclerosis. Electronically Signed   By: David  [place] M.D.   On: 04/19/2017 10:23     Medications:       Assessment/ Plan:  83 y.o. male with end-stage kidney disease, dialysis,diabetes type 2, coronary disease, CABG, anemia of chronic kidney disease, anxiety disorder, GERD, recurrent pleural effusions s/p thoracentesis now with hypotension, pulmonary edema, and worsening pleural effusions.  [facility].  MWF-1. [facility]  1. ESRD on HD MWF. Patient did not have hemodialysis today. Chest x-ray shows pulmonary edema as well as pleural effusions. Therefore we will proceed with dialysis today. Ultrafiltration target 1.5 kg. Hesitant to perform ultrafiltration given relative hypotension. We will likely plan for dialysis tomorrow as well.  2. Pulmonary edema/bilateral pleural effusions. We will perform ultrafiltration to treat the underlying pulmonary edema. Pleural effusions appear to be recurrent. Consider thoracentesis. Patient stopped Lasix on his own. Also consider cardiology consultation with Dr. Mariah Milling.   3. Anemia of chronic kidney disease. Hemoglobin currently 9.1. While the patient is here we will administer Epogen 4000 units IV with dialysis.  4. Secondary hyperparathyroidism. Check intact PTH and phosphorus with dialysis today.   LOS: 0 Dayshia Ballinas 8/29/201811:11 AM

## 2017-04-19 NOTE — Progress Notes (Signed)
Post hemodialysis: Tolerated 3.5hours of treatment with 1.5liters net removal.Hemastasis achieved,sites dressed with gauze/taped.

## 2017-04-19 NOTE — Progress Notes (Signed)
HD initiated without issue. Patient currently in no distress.

## 2017-04-19 NOTE — ED Notes (Signed)
Admitting MD at bedside.

## 2017-04-19 NOTE — ED Notes (Signed)
Pt taken to dialysis by ED Paramedic, Tammy. Orderly to meet pt in dialysis with assigned hospital bed.

## 2017-04-19 NOTE — ED Notes (Addendum)
Report to AshertonAmanda, Charity fundraiserN. Pt will go to hospital room when finished with dialysis.

## 2017-04-19 NOTE — Progress Notes (Signed)
Patient arrived to HD unit from ED. Placed on regular bed. Currently in no distress. States some shortness of breath.

## 2017-04-20 LAB — URINE CULTURE

## 2017-04-20 LAB — GLUCOSE, CAPILLARY
GLUCOSE-CAPILLARY: 56 mg/dL — AB (ref 65–99)
GLUCOSE-CAPILLARY: 73 mg/dL (ref 65–99)
GLUCOSE-CAPILLARY: 116 mg/dL — AB (ref 65–99)
GLUCOSE-CAPILLARY: 135 mg/dL — AB (ref 65–99)
Glucose-Capillary: 32 mg/dL — CL (ref 65–99)
Glucose-Capillary: 268 mg/dL — ABNORMAL HIGH (ref 65–99)

## 2017-04-20 LAB — HEPATITIS B SURFACE ANTIGEN: Hepatitis B Surface Ag: NEGATIVE

## 2017-04-20 LAB — HEPATITIS B SURFACE ANTIBODY, QUANTITATIVE: Hepatitis B-Post: <3.1 m[IU]/mL — ABNORMAL LOW (ref >9.9)

## 2017-04-20 LAB — PARATHYROID HORMONE, INTACT (NO CA): PTH: 114 pg/mL — AB (ref 15–65)

## 2017-04-20 MED ORDER — INSULIN DETEMIR 100 UNIT/ML ~~LOC~~ SOLN: 20.0000 [IU] | Freq: Every day | SUBCUTANEOUS | Status: DC

## 2017-04-20 MED ORDER — NEPRO/CARBSTEADY PO LIQD
237.0000 mL | ORAL | Status: DC
  Administered 2017-04-20: 237 mL via ORAL

## 2017-04-20 MED ORDER — METOPROLOL SUCCINATE ER 25 MG PO TB24
12.5000 mg | ORAL_TABLET | Freq: Every day | ORAL | Status: DC
  Filled 2017-04-20: qty 1

## 2017-04-20 NOTE — Progress Notes (Signed)
Hypoglycemic Event  CBG: 36  Treatment: 15 GM carbohydrate snack  Symptoms: Sweaty and Hungry  Follow-up CBG: Time:0840 CBG Result:73  Possible Reasons for Event: Inadequate meal intake and Medication regimen:    Comments/MD notified: Dr. Elisabeth PigeonVachhani paged   Per patient he only takes 15 units levemir in the morning, instead of 30 units BID. MD notified. Instructed to hold all insulin this morning and MD to change levemir order for tomorrow's dose.     Clayborne DanaBeck, Reyne Falconi B

## 2017-04-20 NOTE — Progress Notes (Signed)
Initial Nutrition Assessment  DOCUMENTATION CODES:   Non-severe (moderate) malnutrition in context of chronic illness  INTERVENTION:  1. Continue rena-vit 2. Recommend Nepro Shake po daily, each supplement provides 425 kcal and 19 grams protein  NUTRITION DIAGNOSIS:   Malnutrition (Moderate) related to chronic illness (ESRD on HD, recurrent pleural effusions secondary to hypovolemia from diastolic CHF) as evidenced by moderate depletion of body fat, moderate depletions of muscle mass  GOAL:   Patient will meet greater than or equal to 90% of their needs  MONITOR:   PO intake, I & O's, Labs, Supplement acceptance, Weight trends  REASON FOR ASSESSMENT:   Malnutrition Screening Tool    ASSESSMENT:   [name] has a PMHHx of CAD s/p CABG, ESRD on HD MWF, HTN, COPD, recurent pleural effusion secondary to hypovolemia from diastolic CHF, T2DM presents with bilateral pulmonary vascular congestion, bilateral pleural effusions now s/p thoracentesis 1.2L of fluid removed. Spoke with family at bedside, patient sleeping comfortably, lethargic today. Had a hypoglycemic episode this morning  At home normally eats a muffin or bagel before dialysis, then eats a larger lunch meal they cook or eat out, and a smaller meal for dinner usually consisting of a meat, starch, multiple vegetables. Weight has been stable around 165 pounds over the past 9 months. Normally drinks 1 nepro daily mixed berry daily. NFPE appears moderate all over for age.  Medications reviewed and include:  Phoslo 2001mg  TID, Sensipar, Colace, Iron Novolog 0-9 Units TID, Levemir 30 Units BID, Megace Labs reviewed:  Phos 5.4 Meal Completion: 80-100%   Intake/Output Summary (Last 24 hours) at 04/20/17 1235 Last data filed at 04/20/17 0956  Gross per 24 hour  Intake              720 ml  Output             1500 ml  Net             -780 ml  (no UOP, 1500mL removed with dialysis)  Diet Order:  Diet renal/carb modified  with fluid restriction Diet-HS Snack? Nothing; Room service appropriate? Yes; Fluid consistency: Thin  Skin:  Reviewed, no issues  Last BM:  04/18/2017  Height:   Ht Readings from Last 1 Encounters:  04/19/17 5\' 10"  (1.778 m)    Weight:   Wt Readings from Last 1 Encounters:  04/19/17 162 lb 0.6 oz (73.5 kg)    Ideal Body Weight:  75.45 kg  BMI:  Body mass index is 23.25 kg/m.  Estimated Nutritional Needs:   Kcal:  1700-1850 calories (MSJ x1.2-1.3)  Protein:  103-118 grams (1.4-1.6g/kg)  Fluid:  UOP +1L  EDUCATION NEEDS:   No education needs identified at this time  Dionne AnoWilliam M. Jyles Sontag, MS, RD LDN Inpatient Clinical Dietitian Pager [phone]

## 2017-04-20 NOTE — Care Management (Signed)
Patient receives chronic hemodialysis at [facility] m w f.  Notified Dimas ChyleAmanda Morris with [facility] [employer] of admission

## 2017-04-20 NOTE — Progress Notes (Signed)
[facility]  ROUNDING NOTE   Subjective:  Patient a left thoracentesis yesterday. 1.2 L of pleural fluid was removed. Patient also had hemodialysis yesterday.  Objective:  Vital signs in last 24 hours:  Temp:  [97.9 F (36.6 C)-98.4 F (36.9 C)] 97.9 F (36.6 C) (08/29 1919) Pulse Rate:  [25-99] 79 (08/30 0757) Resp:  [14-18] 14 (08/30 0757) BP: (94-135)/(46-80) 135/54 (08/30 0757) SpO2:  [83 %-100 %] 99 % (08/30 0757) Weight:  [73.5 kg (162 lb 0.6 oz)-74.8 kg (164 lb 14.5 oz)] 73.5 kg (162 lb 0.6 oz) (08/29 1535)  Weight change:  Filed Weights   04/19/17 0947 04/19/17 1148 04/19/17 1535  Weight: 77.8 kg (171 lb 9.6 oz) 74.8 kg (164 lb 14.5 oz) 73.5 kg (162 lb 0.6 oz)    Intake/Output: I/O last 3 completed shifts: In: 740 [P.O.:240; IV Piggyback:500] Out: 1500 [Other:1500]   Intake/Output this shift:  Total I/O In: 480 [P.O.:480] Out: -   Physical Exam: General: No acute distress  Head: Normocephalic, atraumatic. Moist oral mucosal membranes  Eyes: Anicteric  Neck: Supple, trachea midline  Lungs:  Diminished at bases, on nasal cannula   Heart: S1S2 no rubs  Abdomen:  Soft, nontender, bowel sounds present  Extremities: 2+ peripheral edema.  Neurologic: Awake, alert, following commands  Skin: No lesions  Access: LUE AVF    Basic Metabolic Panel:  Recent Labs Lab 04/19/17 0948 04/19/17 1159  NA 136  --   K 4.6  --   CL 98*  --   CO2 30  --   GLUCOSE 171*  --   BUN 41*  --   CREATININE 4.62*  --   CALCIUM 7.9*  --   PHOS  --  5.4*    Liver Function Tests:  Recent Labs Lab 04/19/17 0948  AST 12*  ALT 6*  ALKPHOS 55  BILITOT 0.4  PROT 7.1  ALBUMIN 2.8*   No results for input(s): LIPASE, AMYLASE in the last 168 hours. No results for input(s): AMMONIA in the last 168 hours.  CBC:  Recent Labs Lab 04/19/17 0948  WBC 5.9  NEUTROABS 4.1  HGB 9.1*  HCT 29.3*  MCV 97.3  PLT 366    Cardiac Enzymes:  Recent Labs Lab  04/19/17 0948  TROPONINI 0.05*    BNP: Invalid input(s): POCBNP  CBG:  Recent Labs Lab 04/19/17 2059 04/20/17 0745 04/20/17 0823 04/20/17 0842 04/20/17 1121  GLUCAP 203* 32* 56* 73 116*    Microbiology: Results for orders placed or performed during the hospital encounter of 04/19/17  MRSA PCR Screening     Status: None   Collection Time: 04/19/17  9:45 AM  Result Value Ref Range Status   MRSA by PCR NEGATIVE NEGATIVE Final    Comment:        The GeneXpert MRSA Assay (FDA approved for NASAL specimens only), is one component of a comprehensive MRSA colonization surveillance program. It is not intended to diagnose MRSA infection nor to guide or monitor treatment for MRSA infections.   Culture, blood (routine x 2)     Status: None (Preliminary result)   Collection Time: 04/19/17  9:50 AM  Result Value Ref Range Status   Specimen Description BLOOD RIGHT AC  Final   Special Requests   Final    BOTTLES DRAWN AEROBIC AND ANAEROBIC Blood Culture results may not be optimal due to an excessive volume of blood received in culture bottles   Culture NO GROWTH < 24 HOURS  Final  Report Status PENDING  Incomplete  Urine culture     Status: Abnormal   Collection Time: 04/19/17  9:50 AM  Result Value Ref Range Status   Specimen Description URINE, RANDOM  Final   Special Requests NONE  Final   Culture (A)  Final    <10,000 COLONIES/mL INSIGNIFICANT GROWTH Performed at [facility], [address], [place], [place] [address]    Report Status 04/20/2017 FINAL  Final  Culture, blood (routine x 2)     Status: None (Preliminary result)   Collection Time: 04/19/17 11:31 AM  Result Value Ref Range Status   Specimen Description BLOOD RIGHT ANTECUBITAL  Final   Special Requests   Final    BOTTLES DRAWN AEROBIC AND ANAEROBIC Blood Culture results may not be optimal due to an inadequate volume of blood received in culture bottles   Culture NO GROWTH < 24 HOURS  Final   Report  Status PENDING  Incomplete    Coagulation Studies:  Recent Labs  04/19/17 0948  LABPROT 13.8  INR 1.07    Urinalysis:  Recent Labs  04/19/17 0950  COLORURINE YELLOW*  LABSPEC 1.012  PHURINE 5.0  GLUCOSEU >=500*  HGBUR MODERATE*  BILIRUBINUR NEGATIVE  KETONESUR NEGATIVE  PROTEINUR 100*  NITRITE NEGATIVE  LEUKOCYTESUR NEGATIVE      Imaging: Dg Chest 1 View  Result Date: 04/19/2017 CLINICAL DATA:  Status post right thoracentesis EXAM: CHEST 1 VIEW COMPARISON:  Film from earlier in the same day FINDINGS: There is been significant decrease in the degree of right-sided pleural effusion. Complete thoracentesis was not performed as the patient could not tolerate further drainage. No pneumothorax is seen. Left-sided pleural effusion is again noted. IMPRESSION: Status post right thoracentesis with no evidence of pneumothorax. Electronically Signed   By: Alcide Clever M.D.   On: 04/19/2017 16:31   Dg Chest 2 View  Result Date: 04/19/2017 CLINICAL DATA:  O tentative at dialysis. Slight cough and sore throat. No shortness of breath or chest pain. EXAM: CHEST  2 VIEW COMPARISON:  Chest x-ray of March 01, 2017 FINDINGS: The lungs are well-expanded. There are moderate size bilateral pleural effusions which have increased in size since the previous study. The left-sided pneumothorax has resolved. The cardiac silhouette is enlarged. The central pulmonary vascularity is prominent. There are are post CABG changes. There is calcification in the wall of the aortic arch. IMPRESSION: Moderate-sized bilateral pleural effusions which have increased in volume since the previous study. Cardiomegaly and mild pulmonary interstitial edema consistent with CHF. No residual pneumothorax on the left. Thoracic aortic atherosclerosis. Electronically Signed   By: David  Swaz[place] M.D.   On: 04/19/2017 10:23   US Thoracentesis Asp Pleural Space W/img Guide  Result Date: 04/19/2017 INDICATION: Bilateral pleural  effusions EXAM: ULTRASOUND GUIDED RIGHT THORACENTESIS MEDICATIONS: None. COMPLICATIONS: None immediate. PROCEDURE: An ultrasound guided thoracentesis was thoroughly discussed with the patient and questions answered. The benefits, risks, alternatives and complications were also discussed. The patient understands and wishes to proceed with the procedure. Written consent was obtained. Ultrasound was performed to localize and mark an adequate pocket of fluid in the right chest. The area was then prepped and draped in the normal sterile fashion. 1% Lidocaine was used for local anesthesia. Under ultrasound guidance a 6 Fr Safe-T-Centesis catheter was introduced. Thoracentesis was performed. The thoracentesis was terminated early due to chest discomfort. The catheter was removed and a dressing applied. FINDINGS: A total of approximately 1.2 L of clear yellow fluid was removed.  Samples were sent to the laboratory as requested by the clinical team. IMPRESSION: Successful ultrasound guided right thoracentesis yielding 1.2 L of pleural fluid. It should be noted that previous thoracentesis on the left demonstrated no significant re-expansion of the left lung and likely not to be of therapeutic benefit for the patient at this time. Electronically Signed   By: Alcide Clever M.D.   On: 04/19/2017 16:30     Medications:       Assessment/ Plan:  83 y.o. male with end-stage kidney disease, dialysis,diabetes type 2, coronary disease, CABG, anemia of chronic kidney disease, anxiety disorder, GERD, recurrent pleural effusions s/p thoracentesis now with hypotension, pulmonary edema, and worsening pleural effusions.  [facility].  MWF-1. [facility]  1. ESRD on HD MWF. Shortness of breath significantly improved after pleural effusion was drained and patient underwent dialysis. Therefore no urgent indication for dialysis today. We will plan for dialysis tomorrow and plan for additional ultrafiltration.  2. Pulmonary  edema/bilateral pleural effusions. Thoracentesis performed 04/19/2017 with 1.2 L of pleural fluid removed.   3. Anemia of chronic kidney disease. Give Epogen 4000 units IV with dialysis tomorrow.  4. Secondary hyperparathyroidism. Phosphorus 5.4 and at target.  LOS: 1 Rubbie Goostree 8/30/201811:38 AM

## 2017-04-20 NOTE — Evaluation (Signed)
Physical Therapy Evaluation Patient Details Name: Kenneth [name] MRN: [id number] DOB: 03-29-1928 Today's Date: 04/20/2017   History of Present Illness  Pt is an 83 y.o. male presenting to hospital with hypotension and pulmonary edema.  Pt admitted to hospital with acute on chronic respiratory failure secondary to acute on chronic diastolic CHF, B pulmonary vascular congestion, and moderate B pleural effusions.  Pt s/p thoracentesis 04/19/17.  Pt with hypoglycemic event AM 04/20/17.  PMH includes anemia, anxiety, CAD, DM, ESRD on HD, htn, OSA, CABG, L TKR, and on chronic 2 L  O2 at home.  Clinical Impression  Prior to hospital admission, pt was ambulating very short distances in home using [facility] (gets pushed in w/c in community); limited mobility at baseline d/t SOB.  Pt lives with his wife in 1 level home with level entry.  Currently pt is min assist supine to sit and stand step turn bed to recliner.  Pt requiring rest breaks after each activity d/t SOB.  Difficult to obtain O2 readings on pt's fingers but once able to achieve O2 reading, pt's O2 at least 91-92% on 2.5 L O2 via nasal cannula (nursing notified and aware of difficulty obtaining O2 readings quickly).  Pt would benefit from skilled PT to address noted impairments and functional limitations (see below for any additional details).  Upon hospital discharge, recommend pt discharge to home with 24/7 assist and HHPT.    Follow Up Recommendations Home health PT;Supervision/Assistance - 24 hour    Equipment Recommendations  Rolling walker with 5" wheels    Recommendations for Other Services       Precautions / Restrictions Precautions Precautions: Fall Precaution Comments: L UE AV fistula Restrictions Weight Bearing Restrictions: No      Mobility  Bed Mobility Overal bed mobility: Needs Assistance Bed Mobility: Supine to Sit     Supine to sit: Min assist;HOB elevated     General bed mobility comments: minimal assist for trunk  and scooting to edge of bed; increased effort to perform  Transfers Overall transfer level: Needs assistance Equipment used: None Transfers: Sit to/from [employer] Sit to Stand: Min assist Stand pivot transfers: Min assist       General transfer comment: assist to initiate stand and to steady taking a few steps bed to recliner  Ambulation/Gait             General Gait Details: deferred d/t pt's SOB with minimal activity  Stairs            Wheelchair Mobility    Modified Rankin (Stroke Patients Only)       Balance Overall balance assessment: Needs assistance Sitting-balance support: Bilateral upper extremity supported;Feet supported Sitting balance-Leahy Scale: Poor Sitting balance - Comments: Requires UE support for sitting balance (SBA)   Standing balance support: Bilateral upper extremity supported Standing balance-Leahy Scale: Poor Standing balance comment: requires UE support for static balance (CGA)                             Pertinent Vitals/Pain Pain Assessment: No/denies pain  HR in 80-92 bpm during session.    Home Living Family/patient expects to be discharged to:: Private residence Living Arrangements: Spouse/significant other Available Help at Discharge: Family Type of Home: House Home Access: Level entry     Home Layout: One level Home Equipment: Environmental consultant - 2 wheels;Cane - single point;Wheelchair - manual;Grab bars - tub/shower;Grab bars - toilet (may have borrowed shower seat)  Prior Function Level of Independence: Needs assistance         Comments: Pt ambulates very short ambulation distances in home (around 10 feet per pt description) and gets pushed in w/c in community.  Limited mobility d/t SOB at baseline.  Pt denies any falls in past 6 months.  Requires assist for ADL's.     Hand Dominance        Extremity/Trunk Assessment   Upper Extremity Assessment Upper Extremity Assessment:  Generalized weakness    Lower Extremity Assessment Lower Extremity Assessment: Generalized weakness       Communication   Communication: HOH  Cognition Arousal/Alertness: Awake/alert Behavior During Therapy: WFL for tasks assessed/performed Overall Cognitive Status: Within Functional Limits for tasks assessed                                        General Comments General comments (skin integrity, edema, etc.): Pt's niece and sister in law present during session.  Nursing cleared pt for participation in physical therapy.  Pt agreeable to PT session.    Exercises     Assessment/Plan    PT Assessment Patient needs continued PT services  PT Problem List Decreased strength;Decreased activity tolerance;Decreased balance;Decreased mobility;Cardiopulmonary status limiting activity       PT Treatment Interventions DME instruction;Gait training;Functional mobility training;Therapeutic activities;Therapeutic exercise;Balance training;Patient/family education    PT Goals (Current goals can be found in the Care Plan section)  Acute Rehab PT Goals Patient Stated Goal: to be able to breathe better PT Goal Formulation: With patient Time For Goal Achievement: 05/04/17 Potential to Achieve Goals: Fair    Frequency Min 2X/week   Barriers to discharge        Co-evaluation               AM-PAC PT "6 Clicks" Daily Activity  Outcome Measure Difficulty turning over in bed (including adjusting bedclothes, sheets and blankets)?: Unable Difficulty moving from lying on back to sitting on the side of the bed? : Unable Difficulty sitting down on and standing up from a chair with arms (e.g., wheelchair, bedside commode, etc,.)?: Unable Help needed moving to and from a bed to chair (including a wheelchair)?: A Little Help needed walking in hospital room?: A Little Help needed climbing 3-5 steps with a railing? : A Lot 6 Click Score: 11    End of Session Equipment  Utilized During Treatment: Gait belt;Oxygen (2.5 L/min O2 via nasal cannula) Activity Tolerance: Other (comment) (Limited d/t SOB) Patient left: in chair;with call bell/phone within reach;with chair alarm set;with family/visitor present Nurse Communication: Mobility status;Precautions (Pt's O2 sats during session (nursing present during session assessing O2 and aware)) PT Visit Diagnosis: Other abnormalities of gait and mobility (R26.89);Difficulty in walking, not elsewhere classified (R26.2);Muscle weakness (generalized) (M62.81)    Time: 4098-1191 PT Time Calculation (min) (ACUTE ONLY): 32 min   Charges:   PT Evaluation $PT Eval Low Complexity: 1 Low PT Treatments $Therapeutic Activity: 8-22 mins   PT G Codes:   PT G-Codes **NOT FOR INPATIENT CLASS** Functional Assessment Tool Used: AM-PAC 6 Clicks Basic Mobility Functional Limitation: Mobility: Walking and moving around Mobility: Walking and Moving Around Current Status (Y7829): At least 60 percent but less than 80 percent impaired, limited or restricted Mobility: Walking and Moving Around Goal Status [phone]): At least 20 percent but less than 40 percent impaired, limited or restricted    [employer],  PT 04/20/17, 3:48 PM [phone]

## 2017-04-20 NOTE — Progress Notes (Signed)
[employer] [facility] [place] [facility]   PATIENT NAME: Kenneth [name]    MR#:  [id number]  DATE OF BIRTH:  September 28, 1927  SUBJECTIVE:  CHIEF COMPLAINT:   Chief Complaint  Patient presents with  . Hypotension  Sent from dialysis Center with hypotension and lethargy.  He had a large pleural effusion and hypoxia, felt much better after having pleural tap and removal of 1.2 L of fluid. He also received some long-acting insulin yesterday in hospital and today morning his blood sugar dropped up to 30, which she recovered and currently stable. The patient is alert and oriented. Patient feels much better today.   REVIEW OF SYSTEMS:  CONSTITUTIONAL: No fever, fatigue or weakness.  EYES: No blurred or double vision.  EARS, NOSE, AND THROAT: No tinnitus or ear pain.  RESPIRATORY: No cough, shortness of breath, wheezing or hemoptysis.  CARDIOVASCULAR: No chest pain, orthopnea, edema.  GASTROINTESTINAL: No nausea, vomiting, diarrhea or abdominal pain.  GENITOURINARY: No dysuria, hematuria.  ENDOCRINE: No polyuria, nocturia,  HEMATOLOGY: No anemia, easy bruising or bleeding SKIN: No rash or lesion. MUSCULOSKELETAL: No joint pain or arthritis.   NEUROLOGIC: No tingling, numbness, weakness.  PSYCHIATRY: No anxiety or depression.   ROS  DRUG ALLERGIES:   Allergies  Allergen Reactions  . Bee Venom   . Beef-Derived Products Hives  . Iodine Hives  . Ivp Dye [Iodinated Diagnostic Agents] Hives    VITALS:  Blood pressure (!) 135/54, pulse 79, temperature 97.9 F (36.6 C), temperature source Oral, resp. rate 14, height 5\' 10"  (1.778 m), weight 73.5 kg (162 lb 0.6 oz), SpO2 99 %.  PHYSICAL EXAMINATION:  GENERAL:  83 y.o.-year-old patient lying in the bed with no acute distress.  EYES: Pupils equal, round, reactive to light and accommodation. No scleral icterus. Extraocular muscles intact.  HEENT: Head atraumatic, normocephalic. Oropharynx and nasopharynx clear.  NECK:  Supple, no  jugular venous distention. No thyroid enlargement, no tenderness.  LUNGS: Normal breath sounds bilaterally, no wheezing, some bilateral crepitation. No use of accessory muscles of respiration.  CARDIOVASCULAR: S1, S2 normal. No murmurs, rubs, or gallops.  ABDOMEN: Soft, nontender, nondistended. Bowel sounds present. No organomegaly or mass.  EXTREMITIES: No pedal edema, cyanosis, or clubbing.  NEUROLOGIC: Cranial nerves II through XII are intact. Muscle strength 4/5 in all extremities. Sensation intact. Gait not checked.  PSYCHIATRIC: The patient is alert and oriented x 3.  SKIN: No obvious rash, lesion, or ulcer.   Physical Exam LABORATORY PANEL:   CBC  Recent Labs Lab 04/19/17 0948  WBC 5.9  HGB 9.1*  HCT 29.3*  PLT 366   ------------------------------------------------------------------------------------------------------------------  Chemistries   Recent Labs Lab 04/19/17 0948  NA 136  K 4.6  CL 98*  CO2 30  GLUCOSE 171*  BUN 41*  CREATININE 4.62*  CALCIUM 7.9*  AST 12*  ALT 6*  ALKPHOS 55  BILITOT 0.4   ------------------------------------------------------------------------------------------------------------------  Cardiac Enzymes  Recent Labs Lab 04/19/17 0948  TROPONINI 0.05*   ------------------------------------------------------------------------------------------------------------------  RADIOLOGY:  Dg Chest 1 View  Result Date: 04/19/2017 CLINICAL DATA:  Status post right thoracentesis EXAM: CHEST 1 VIEW COMPARISON:  Film from earlier in the same day FINDINGS: There is been significant decrease in the degree of right-sided pleural effusion. Complete thoracentesis was not performed as the patient could not tolerate further drainage. No pneumothorax is seen. Left-sided pleural effusion is again noted. IMPRESSION: Status post right thoracentesis with no evidence of pneumothorax. Electronically Signed   By: Eulah Pont.D.  On: 04/19/2017 16:31    Dg Chest 2 View  Result Date: 04/19/2017 CLINICAL DATA:  O tentative at dialysis. Slight cough and sore throat. No shortness of breath or chest pain. EXAM: CHEST  2 VIEW COMPARISON:  Chest x-ray of March 01, 2017 FINDINGS: The lungs are well-expanded. There are moderate size bilateral pleural effusions which have increased in size since the previous study. The left-sided pneumothorax has resolved. The cardiac silhouette is enlarged. The central pulmonary vascularity is prominent. There are are post CABG changes. There is calcification in the wall of the aortic arch. IMPRESSION: Moderate-sized bilateral pleural effusions which have increased in volume since the previous study. Cardiomegaly and mild pulmonary interstitial edema consistent with CHF. No residual pneumothorax on the left. Thoracic aortic atherosclerosis. Electronically Signed   By: David  Swazil[place]Jordan M.D.   On: 04/19/2017 10:23   [place] Thoracentesis Asp Pleural Space W/img Guide  Result Date: 04/19/2017 INDICATION: Bilateral pleural effusions EXAM: ULTRASOUND GUIDED RIGHT THORACENTESIS MEDICATIONS: None. COMPLICATIONS: None immediate. PROCEDURE: An ultrasound guided thoracentesis was thoroughly discussed with the patient and questions answered. The benefits, risks, alternatives and complications were also discussed. The patient understands and wishes to proceed with the procedure. Written consent was obtained. Ultrasound was performed to localize and mark an adequate pocket of fluid in the right chest. The area was then prepped and draped in the normal sterile fashion. 1% Lidocaine was used for local anesthesia. Under ultrasound guidance a 6 Fr Safe-T-Centesis catheter was introduced. Thoracentesis was performed. The thoracentesis was terminated early due to chest discomfort. The catheter was removed and a dressing applied. FINDINGS: A total of approximately 1.2 L of clear yellow fluid was removed. Samples were sent to the laboratory as requested by the  clinical team. IMPRESSION: Successful ultrasound guided right thoracentesis yielding 1.2 L of pleural fluid. It should be noted that previous thoracentesis on the left demonstrated no significant re-expansion of the left lung and likely not to be of therapeutic benefit for the patient at this time. Electronically Signed   By: Alcide CleverMark  Lukens M.D.   On: 04/19/2017 16:30    ASSESSMENT AND PLAN:   Active Problems:   Pulmonary edema   Kenneth [name]  is a 83 y.o. male with a known history of coronary artery disease status post CABG, end-stage renal disease on hemodialysis Monday Wednesday Friday, hypertension, COPD on chronic 2 L nasal cannula oxygen with long-standing history of smoking, recurrent pleural effusion secondary to hypovolemia from diastolic congestive heart failure comes to the emergency room after he was found to be hypotensive with blood pressure in the 60s when he went to dialysis.  1. Acute on chronic respiratory failure secondary to acute on chronic congestive heart failure diastolic with bilateral pulmonary vascular congestion one in the lower and moderate bilateral pleural effusions  -Nephrology consultation for Hemodialysis -Appreciated help of IR to get thoracentesis done- 1.2 ltr fluid removed from right side. -patient currently is not taking his torsemide secondary to cramping in his legs, and have very little urine output, so stopped that. -Cardiac consultation if needed--- he follows with Dr. Mariah MillingGollan.  2. Coronary artery disease status post CABG -Continue home meds with aspirin and statins  3. ESRD on dialysis -Spoke with Dr. Cherylann RatelLateef who scheduled patient for dialysis today  4. Type 2 diabetes--insulin requiring- hypoglycemia -Continue sliding scale - Before meals long-acting insulin yesterday as it was reported in his home medications on admission. His blood sugar dropped early morning today, on confirmation with family they reported patient  does not take any insulin  currently, taken off recently by primary care.   Currently blood sugar is stable, I will stop long-acting insulin , keeping him sliding scale coverage.  5. COPD chronic oxygen requiring -Continue oxygen, nebulizers  6. Hypertension   Blood pressure was reported to be low on admission, I will stop losartan and decrease the dose of metoprolol, currently blood pressure is stable.  All the records are reviewed and case discussed with Care Management/Social Workerr. Management plans discussed with the patient, family and they are in agreement.  CODE STATUS: DNR  TOTAL TIME TAKING CARE OF THIS PATIENT: 35 minutes.   Discussion with multiple family members in the room.  POSSIBLE D/C IN 1-2 DAYS, DEPENDING ON CLINICAL CONDITION.   Altamese Dilling M.D on 04/20/2017   Between 7am to 6pm - Pager - [phone]  After 6pm go to [email or web address] - password [email or web address]  [facility]  Office  [phone]  CC: Primary care physician; Barbette Reichmann, MD  Note: This dictation was prepared with Dragon dictation along with smaller phrase technology. Any transcriptional errors that result from this process are unintentional.

## 2017-04-21 LAB — PHOSPHORUS: Phosphorus: 4.9 mg/dL — ABNORMAL HIGH (ref 2.5–4.6)

## 2017-04-21 LAB — GLUCOSE, CAPILLARY
GLUCOSE-CAPILLARY: 100 mg/dL — AB (ref 65–99)
Glucose-Capillary: 132 mg/dL — ABNORMAL HIGH (ref 65–99)

## 2017-04-21 MED ORDER — ALPRAZOLAM 0.25 MG PO TABS
0.2500 mg | ORAL_TABLET | Freq: Once | ORAL | Status: AC
  Administered 2017-04-21: 0.25 mg via ORAL
  Filled 2017-04-21: qty 1

## 2017-04-21 MED ORDER — NEPRO/CARBSTEADY PO LIQD: 237.0000 mL | ORAL | 0 refills | Status: AC

## 2017-04-21 MED ORDER — FREESTYLE LANCETS MISC: 5 refills | Status: AC

## 2017-04-21 MED ORDER — EPOETIN ALFA 10000 UNIT/ML IJ SOLN
4000.0000 [IU] | INTRAMUSCULAR | Status: DC
  Administered 2017-04-21: 4000 [IU] via INTRAVENOUS

## 2017-04-21 MED ORDER — METOPROLOL SUCCINATE ER 25 MG PO TB24: 12.5000 mg | ORAL_TABLET | Freq: Every day | ORAL | 0 refills | Status: AC

## 2017-04-21 NOTE — Discharge Summary (Signed)
[facility] - [place] at [facility]   PATIENT NAME: Kenneth [name]    MR#:  [id number]  DATE OF BIRTH:  Mar 04, 1928  DATE OF ADMISSION:  04/19/2017 ADMITTING PHYSICIAN: Enedina Finner, MD  DATE OF DISCHARGE: 04/21/2017   PRIMARY CARE PHYSICIAN: Barbette Reichmann, MD    ADMISSION DIAGNOSIS:  Pleural effusion [J90] Status post thoracentesis [Z98.890] Pleural effusion, bilateral [J90] Hypotension, unspecified hypotension type [I95.9] Congestive heart failure, unspecified HF chronicity, unspecified heart failure type (HCC) [I50.9]  DISCHARGE DIAGNOSIS:  Active Problems:   Pulmonary edema   SECONDARY DIAGNOSIS:   Past Medical History:  Diagnosis Date  . Anemia   . Anxiety   . Arthritis   . B12 deficiency   . Coronary artery disease    a. 2vCABG LIMA-LAD, SVG-OM1 in 1999; b. Lexiscan myoview (5/10) showed EF 45%, HK apex & lateral wall, fixed perfusion defect involving apex, inferoapex, & inferolateral wall, also some inferoapical ischemia; LHC (5/10) showed patent LIMA-LAD and totally occluded SVG-OM1, 99% pLAD, 40% ostial CFX, 40% mCFX, 30% OM1, 20% pRCA  . Diabetes mellitus   . ESRD on hemodialysis (HCC)    a. once weekly as of 08/2015. Currently 3xweek since 09/11/15.  Marland Kitchen GERD (gastroesophageal reflux disease)   . History of MRI of chest 11/10   a. Cardiac; EF 48%, mid anterolateral and inferolateral walls, apical lateral wall, apical septal wall, and true apex were all think and akinetic; 51-75% thickness subendocardial delayed enhancement in mid anterolateral and inferolateral walls, full thickness enhancement in apical laterla and apical septal walls and true apex  . Hyperlipidemia   . Hypertension   . Ischemic cardiomyopathy    a. likely ICM, EF 55-60%, RWMA could not be excluded, nl PASP, nl CVP  . Nephrolithiasis   . Obesity   . Occasional tremors   . OSA (obstructive sleep apnea)    Moderate CPAP  . Shortness of breath dyspnea   . Spinal  stenosis     HOSPITAL COURSE:   1. Acute on chronic respiratory failure secondary to acute on chronic congestive heart failure diastolic with bilateral pulmonary vascular congestion one in the lower and moderate bilateral pleural effusions  -Nephrology consultation for Hemodialysis -Appreciated help of IR to get thoracentesis done- 1.2 ltr fluid removed from right side. -patient currently is not taking his torsemide secondary to cramping in his legs, and have very little urine output, so stopped that. -Cardiac consultation was not needed--- he follows with Dr. Mariah Milling. - he is improved and now on 2 ltr nasal canula oxygen, which is his baseline.  2. Coronary artery disease status post CABG -Continue home meds with aspirin and statins  3. ESRD on dialysis -Spoke with Dr. Cherylann Ratel who scheduled patient for dialysis today  4. Type 2 diabetes--insulin requiring- hypoglycemia -Continue sliding scale - Before meals long-acting insulin yesterday as it was reported in his home medications on admission. His blood sugar dropped early morning today, on confirmation with family they reported patient does not take any insulin currently, taken off recently by primary care.   Currently blood sugar is stable, I will stop long-acting insulin , keeping him sliding scale coverage.  5. COPD chronic oxygen requiring -Continue oxygen, nebulizers  6. Hypertension   Blood pressure was reported to be low on admission, I will stop losartan and decrease the dose of metoprolol, currently blood pressure is stable.  DISCHARGE CONDITIONS:   Stable.  CONSULTS OBTAINED:    DRUG ALLERGIES:   Allergies  Allergen Reactions  .  Bee Venom   . Beef-Derived Products Hives  . Iodine Hives  . Ivp Dye [Iodinated Diagnostic Agents] Hives    DISCHARGE MEDICATIONS:   Current Discharge Medication List    START taking these medications   Details  Nutritional Supplements (FEEDING SUPPLEMENT, NEPRO CARB  STEADY,) LIQD Take 237 mLs by mouth daily. Qty: 3 Can, Refills: 0      CONTINUE these medications which have CHANGED   Details  metoprolol succinate (TOPROL XL) 25 MG 24 hr tablet Take 0.5 tablets (12.5 mg total) by mouth daily. Qty: 30 tablet, Refills: 0      CONTINUE these medications which have NOT CHANGED   Details  aspirin EC 81 MG tablet Take 81 mg by mouth daily.    B Complex-C-Folic Acid (VOL-CARE RX) 1 MG TABS Take 1 tablet by mouth every evening.  Refills: 5    calcium acetate (PHOSLO) 667 MG capsule Take 2,001 mg by mouth 3 (three) times daily.     cinacalcet (SENSIPAR) 30 MG tablet Take 1 tablet by mouth daily.    ferrous sulfate 325 (65 FE) MG tablet Take 325 mg by mouth daily.    hydrocortisone valerate ointment (WESTCORT) 0.2 % Apply to affected area daily Qty: 45 g, Refills: 1    hydrOXYzine (ATARAX) 10 MG/5ML syrup Take 5 mLs (10 mg total) by mouth 3 (three) times daily as needed for itching. Qty: 118 mL, Refills: 0    lidocaine-prilocaine (EMLA) cream Apply 2.5 application topically as needed.    lovastatin (MEVACOR) 20 MG tablet Take 20 mg by mouth at bedtime.    megestrol (MEGACE) 20 MG tablet Take 20 mg by mouth daily.    nitroGLYCERIN (NITROSTAT) 0.4 MG SL tablet Place 1 tablet (0.4 mg total) under the tongue every 5 (five) minutes as needed for chest pain. Qty: 25 tablet, Refills: 3    sertraline (ZOLOFT) 25 MG tablet Take 1 tablet by mouth daily.     SPIRIVA HANDIHALER 18 MCG inhalation capsule Place 1 capsule into inhaler and inhale daily.    tamsulosin (FLOMAX) 0.4 MG CAPS capsule Take 1 capsule (0.4 mg total) by mouth daily. Qty: 30 capsule, Refills: 0    traZODone (DESYREL) 50 MG tablet Take 1 tablet by mouth at bedtime.      STOP taking these medications     Insulin Detemir (LEVEMIR FLEXTOUCH) 100 UNIT/ML Pen      torsemide (DEMADEX) 100 MG tablet      lisinopril (PRINIVIL,ZESTRIL) 20 MG tablet      losartan (COZAAR) 50 MG tablet           DISCHARGE INSTRUCTIONS:    Follow with PMD in 1-2 weeks.  If you experience worsening of your admission symptoms, develop shortness of breath, life threatening emergency, suicidal or homicidal thoughts you must seek medical attention immediately by calling 911 or calling your MD immediately  if symptoms less severe.  You Must read complete instructions/literature along with all the possible adverse reactions/side effects for all the Medicines you take and that have been prescribed to you. Take any new Medicines after you have completely understood and accept all the possible adverse reactions/side effects.   Please note  You were cared for by a hospitalist during your hospital stay. If you have any questions about your discharge medications or the care you received while you were in the hospital after you are discharged, you can call the unit and asked to speak with the hospitalist on call if the hospitalist that  took care of you is not available. Once you are discharged, your primary care physician will handle any further medical issues. Please note that NO REFILLS for any discharge medications will be authorized once you are discharged, as it is imperative that you return to your primary care physician (or establish a relationship with a primary care physician if you do not have one) for your aftercare needs so that they can reassess your need for medications and monitor your lab values.    Today   CHIEF COMPLAINT:   Chief Complaint  Patient presents with  . Hypotension    HISTORY OF PRESENT ILLNESS:  Benicio Manna  is a 83 y.o. male with a known history of coronary artery disease status post CABG, end-stage renal disease on hemodialysis Monday Wednesday Friday, hypertension, COPD on chronic 2 L nasal cannula oxygen with long-standing history of smoking, recurrent pleural effusion secondary to hypovolemia from diastolic congestive heart failure comes to the emergency room  after he was found to be hypotensive with blood pressure in the 60s when he went to dialysis. Patient has been complaining of increasing shortness of breath. He is breathing through his mouth. He uses his oxygen at home. He was found to have bilateral pleural effusion and pulmonary vascular congestion. Patient is being admitted with acute on chronic hypoxic respiratory failure with acute on chronic diastolic congestive heart failure and bilateral recurrent pleural effusion.   VITAL SIGNS:  Blood pressure 129/73, pulse 97, temperature 97.6 F (36.4 C), temperature source Oral, resp. rate (!) 21, height 5\' 10"  (1.778 m), weight 73.8 kg (162 lb 11.2 oz), SpO2 98 %.  I/O:   Intake/Output Summary (Last 24 hours) at 04/21/17 1425 Last data filed at 04/21/17 1310  Gross per 24 hour  Intake              240 ml  Output             1700 ml  Net            -1460 ml    PHYSICAL EXAMINATION:   GENERAL:  83 y.o.-year-old patient lying in the bed with no acute distress.  EYES: Pupils equal, round, reactive to light and accommodation. No scleral icterus. Extraocular muscles intact.  HEENT: Head atraumatic, normocephalic. Oropharynx and nasopharynx clear.  NECK:  Supple, no jugular venous distention. No thyroid enlargement, no tenderness.  LUNGS: Normal breath sounds bilaterally, no wheezing, some bilateral crepitation. No use of accessory muscles of respiration.  CARDIOVASCULAR: S1, S2 normal. No murmurs, rubs, or gallops.  ABDOMEN: Soft, nontender, nondistended. Bowel sounds present. No organomegaly or mass.  EXTREMITIES: No pedal edema, cyanosis, or clubbing.  NEUROLOGIC: Cranial nerves II through XII are intact. Muscle strength 4/5 in all extremities. Sensation intact. Gait not checked.  PSYCHIATRIC: The patient is alert and oriented x 3.  SKIN: No obvious rash, lesion, or ulcer.    DATA REVIEW:   CBC  Recent Labs Lab 04/19/17 0948  WBC 5.9  HGB 9.1*  HCT 29.3*  PLT 366     Chemistries   Recent Labs Lab 04/19/17 0948  NA 136  K 4.6  CL 98*  CO2 30  GLUCOSE 171*  BUN 41*  CREATININE 4.62*  CALCIUM 7.9*  AST 12*  ALT 6*  ALKPHOS 55  BILITOT 0.4    Cardiac Enzymes  Recent Labs Lab 04/19/17 0948  TROPONINI 0.05*    Microbiology Results  Results for orders placed or performed during the hospital encounter of 04/19/17  MRSA PCR Screening     Status: None   Collection Time: 04/19/17  9:45 AM  Result Value Ref Range Status   MRSA by PCR NEGATIVE NEGATIVE Final    Comment:        The GeneXpert MRSA Assay (FDA approved for NASAL specimens only), is one component of a comprehensive MRSA colonization surveillance program. It is not intended to diagnose MRSA infection nor to guide or monitor treatment for MRSA infections.   Culture, blood (routine x 2)     Status: None (Preliminary result)   Collection Time: 04/19/17  9:50 AM  Result Value Ref Range Status   Specimen Description BLOOD RIGHT AC  Final   Special Requests   Final    BOTTLES DRAWN AEROBIC AND ANAEROBIC Blood Culture results may not be optimal due to an excessive volume of blood received in culture bottles   Culture NO GROWTH 2 DAYS  Final   Report Status PENDING  Incomplete  Urine culture     Status: Abnormal   Collection Time: 04/19/17  9:50 AM  Result Value Ref Range Status   Specimen Description URINE, RANDOM  Final   Special Requests NONE  Final   Culture (A)  Final    <10,000 COLONIES/mL INSIGNIFICANT GROWTH Performed at [facility], [address] [place], [place] [address]    Report Status 04/20/2017 FINAL  Final  Culture, blood (routine x 2)     Status: None (Preliminary result)   Collection Time: 04/19/17 11:31 AM  Result Value Ref Range Status   Specimen Description BLOOD RIGHT ANTECUBITAL  Final   Special Requests   Final    BOTTLES DRAWN AEROBIC AND ANAEROBIC Blood Culture results may not be optimal due to an inadequate volume of blood received  in culture bottles   Culture NO GROWTH 2 DAYS  Final   Report Status PENDING  Incomplete    RADIOLOGY:  Dg Chest 1 View  Result Date: 04/19/2017 CLINICAL DATA:  Status post right thoracentesis EXAM: CHEST 1 VIEW COMPARISON:  Film from earlier in the same day FINDINGS: There is been significant decrease in the degree of right-sided pleural effusion. Complete thoracentesis was not performed as the patient could not tolerate further drainage. No pneumothorax is seen. Left-sided pleural effusion is again noted. IMPRESSION: Status post right thoracentesis with no evidence of pneumothorax. Electronically Signed   By: Alcide CleverMark  Lukens M.D.   On: 04/19/2017 16:31   [place] Thoracentesis Asp Pleural Space W/img Guide  Result Date: 04/19/2017 INDICATION: Bilateral pleural effusions EXAM: ULTRASOUND GUIDED RIGHT THORACENTESIS MEDICATIONS: None. COMPLICATIONS: None immediate. PROCEDURE: An ultrasound guided thoracentesis was thoroughly discussed with the patient and questions answered. The benefits, risks, alternatives and complications were also discussed. The patient understands and wishes to proceed with the procedure. Written consent was obtained. Ultrasound was performed to localize and mark an adequate pocket of fluid in the right chest. The area was then prepped and draped in the normal sterile fashion. 1% Lidocaine was used for local anesthesia. Under ultrasound guidance a 6 Fr Safe-T-Centesis catheter was introduced. Thoracentesis was performed. The thoracentesis was terminated early due to chest discomfort. The catheter was removed and a dressing applied. FINDINGS: A total of approximately 1.2 L of clear yellow fluid was removed. Samples were sent to the laboratory as requested by the clinical team. IMPRESSION: Successful ultrasound guided right thoracentesis yielding 1.2 L of pleural fluid. It should be noted that previous thoracentesis on the left demonstrated no significant re-expansion of the  left lung and  likely not to be of therapeutic benefit for the patient at this time. Electronically Signed   By: Alcide Clever M.D.   On: 04/19/2017 16:30    EKG:   Orders placed or performed during the hospital encounter of 04/19/17  . ED EKG  . ED EKG  . EKG 12-Lead  . EKG 12-Lead      Management plans discussed with the patient, family and they are in agreement.  CODE STATUS:     Code Status Orders        Start     Ordered   04/19/17 1709  Do not attempt resuscitation (DNR)  Continuous    Question Answer Comment  In the event of cardiac or respiratory ARREST Do not call a "code blue"   In the event of cardiac or respiratory ARREST Do not perform Intubation, CPR, defibrillation or ACLS   In the event of cardiac or respiratory ARREST Use medication by any route, position, wound care, and other measures to relive pain and suffering. May use oxygen, suction and manual treatment of airway obstruction as needed for comfort.      04/19/17 1708    Code Status History    Date Active Date Inactive Code Status Order ID Comments User Context   07/15/2016 12:59 AM 07/15/2016  5:50 PM Full Code [id number]  Tonye Royalty, DO Inpatient   03/22/2016  3:31 PM 03/23/2016 12:54 PM Full Code [id number]  Gilda Crease Latina Craver, MD Inpatient   08/24/2015  4:21 PM 09/04/2015  8:59 PM Full Code [id number]  Enedina Finner, MD Inpatient      TOTAL TIME TAKING CARE OF THIS PATIENT: 35 minutes.    Altamese Dilling M.D on 04/21/2017 at 2:25 PM  Between 7am to 6pm - Pager - [phone]  After 6pm go to [email or web address] - password [employer]  [facility]  Office  [phone]  CC: Primary care physician; Barbette Reichmann, MD   Note: This dictation was prepared with Dragon dictation along with smaller phrase technology. Any transcriptional errors that result from this process are unintentional.

## 2017-04-21 NOTE — Progress Notes (Signed)
[facility]  ROUNDING NOTE   Subjective:  Patient sent and evaluated during dialysis. BFR 350. UF target 1.5kg net.   Overall doing much better. Less short of breath.   Objective:  Vital signs in last 24 hours:  Temp:  [98 F (36.7 C)-98.1 F (36.7 C)] 98.1 F (36.7 C) (08/31 0441) Pulse Rate:  [84-100] 94 (08/31 1200) Resp:  [14-23] 17 (08/31 1200) BP: (94-134)/(42-103) 118/103 (08/31 1200) SpO2:  [92 %-98 %] 98 % (08/31 0945) Weight:  [75.8 kg (167 lb 1.7 oz)] 75.8 kg (167 lb 1.7 oz) (08/31 0940)  Weight change:  Filed Weights   04/19/17 1148 04/19/17 1535 04/21/17 0940  Weight: 74.8 kg (164 lb 14.5 oz) 73.5 kg (162 lb 0.6 oz) 75.8 kg (167 lb 1.7 oz)    Intake/Output: I/O last 3 completed shifts: In: 960 [P.O.:960] Out: 200 [Urine:200]   Intake/Output this shift:  Total I/O In: -  Out: 100 [Urine:100]  Physical Exam: General: No acute distress  Head: Normocephalic, atraumatic. Moist oral mucosal membranes  Eyes: Anicteric  Neck: Supple, trachea midline  Lungs:  CTAB, normal effort, on O2  Heart: S1S2 no rubs  Abdomen:  Soft, nontender, bowel sounds present  Extremities: 1+ peripheral edema.  Neurologic: Awake, alert, following commands  Skin: No lesions  Access: LUE AVF    Basic Metabolic Panel:  Recent Labs Lab 04/19/17 0948 04/19/17 1159 04/21/17 0945  NA 136  --   --   K 4.6  --   --   CL 98*  --   --   CO2 30  --   --   GLUCOSE 171*  --   --   BUN 41*  --   --   CREATININE 4.62*  --   --   CALCIUM 7.9*  --   --   PHOS  --  5.4* 4.9*    Liver Function Tests:  Recent Labs Lab 04/19/17 0948  AST 12*  ALT 6*  ALKPHOS 55  BILITOT 0.4  PROT 7.1  ALBUMIN 2.8*   No results for input(s): LIPASE, AMYLASE in the last 168 hours. No results for input(s): AMMONIA in the last 168 hours.  CBC:  Recent Labs Lab 04/19/17 0948  WBC 5.9  NEUTROABS 4.1  HGB 9.1*  HCT 29.3*  MCV 97.3  PLT 366    Cardiac Enzymes:  Recent  Labs Lab 04/19/17 0948  TROPONINI 0.05*    BNP: Invalid input(s): POCBNP  CBG:  Recent Labs Lab 04/20/17 0842 04/20/17 1121 04/20/17 1702 04/20/17 2034 04/21/17 0748  GLUCAP 73 116* 135* 268* 132*    Microbiology: Results for orders placed or performed during the hospital encounter of 04/19/17  MRSA PCR Screening     Status: None   Collection Time: 04/19/17  9:45 AM  Result Value Ref Range Status   MRSA by PCR NEGATIVE NEGATIVE Final    Comment:        The GeneXpert MRSA Assay (FDA approved for NASAL specimens only), is one component of a comprehensive MRSA colonization surveillance program. It is not intended to diagnose MRSA infection nor to guide or monitor treatment for MRSA infections.   Culture, blood (routine x 2)     Status: None (Preliminary result)   Collection Time: 04/19/17  9:50 AM  Result Value Ref Range Status   Specimen Description BLOOD RIGHT [facility]  Final   Special Requests   Final    BOTTLES DRAWN AEROBIC AND ANAEROBIC Blood Culture results may not  be optimal due to an excessive volume of blood received in culture bottles   Culture NO GROWTH 2 DAYS  Final   Report Status PENDING  Incomplete  Urine culture     Status: Abnormal   Collection Time: 04/19/17  9:50 AM  Result Value Ref Range Status   Specimen Description URINE, RANDOM  Final   Special Requests NONE  Final   Culture (A)  Final    <10,000 COLONIES/mL INSIGNIFICANT GROWTH Performed at [facility], [address] [place], [place] [address]    Report Status 04/20/2017 FINAL  Final  Culture, blood (routine x 2)     Status: None (Preliminary result)   Collection Time: 04/19/17 11:31 AM  Result Value Ref Range Status   Specimen Description BLOOD RIGHT ANTECUBITAL  Final   Special Requests   Final    BOTTLES DRAWN AEROBIC AND ANAEROBIC Blood Culture results may not be optimal due to an inadequate volume of blood received in culture bottles   Culture NO GROWTH 2 DAYS  Final    Report Status PENDING  Incomplete    Coagulation Studies:  Recent Labs  04/19/17 0948  LABPROT 13.8  INR 1.07    Urinalysis:  Recent Labs  04/19/17 0950  COLORURINE YELLOW*  LABSPEC 1.012  PHURINE 5.0  GLUCOSEU >=500*  HGBUR MODERATE*  BILIRUBINUR NEGATIVE  KETONESUR NEGATIVE  PROTEINUR 100*  NITRITE NEGATIVE  LEUKOCYTESUR NEGATIVE      Imaging: Dg Chest 1 View  Result Date: 04/19/2017 CLINICAL DATA:  Status post right thoracentesis EXAM: CHEST 1 VIEW COMPARISON:  Film from earlier in the same day FINDINGS: There is been significant decrease in the degree of right-sided pleural effusion. Complete thoracentesis was not performed as the patient could not tolerate further drainage. No pneumothorax is seen. Left-sided pleural effusion is again noted. IMPRESSION: Status post right thoracentesis with no evidence of pneumothorax. Electronically Signed   By: Alcide Clever M.D.   On: 04/19/2017 16:31   US Thoracentesis Asp Pleural Space W/img Guide  Result Date: 04/19/2017 INDICATION: Bilateral pleural effusions EXAM: ULTRASOUND GUIDED RIGHT THORACENTESIS MEDICATIONS: None. COMPLICATIONS: None immediate. PROCEDURE: An ultrasound guided thoracentesis was thoroughly discussed with the patient and questions answered. The benefits, risks, alternatives and complications were also discussed. The patient understands and wishes to proceed with the procedure. Written consent was obtained. Ultrasound was performed to localize and mark an adequate pocket of fluid in the right chest. The area was then prepped and draped in the normal sterile fashion. 1% Lidocaine was used for local anesthesia. Under ultrasound guidance a 6 Fr Safe-T-Centesis catheter was introduced. Thoracentesis was performed. The thoracentesis was terminated early due to chest discomfort. The catheter was removed and a dressing applied. FINDINGS: A total of approximately 1.2 L of clear yellow fluid was removed. Samples were sent to  the laboratory as requested by the clinical team. IMPRESSION: Successful ultrasound guided right thoracentesis yielding 1.2 L of pleural fluid. It should be noted that previous thoracentesis on the left demonstrated no significant re-expansion of the left lung and likely not to be of therapeutic benefit for the patient at this time. Electronically Signed   By: Alcide Clever M.D.   On: 04/19/2017 16:30     Medications:       Assessment/ Plan:  83 y.o. male with end-stage kidney disease, dialysis,diabetes type 2, coronary disease, CABG, anemia of chronic kidney disease, anxiety disorder, GERD, recurrent pleural effusions s/p thoracentesis now with hypotension, pulmonary edema, and worsening pleural  effusions.  [facility].  MWF-1. [facility]  1. ESRD on HD MWF. Patient seen during dialysis today.  Tolerating well.  BFR 350, UF target 1.5kg net.   2. Pulmonary edema/bilateral pleural effusions. Thoracentesis performed 04/19/2017 with 1.2 L of pleural fluid removed. Pt's breathing improved.   3. Anemia of chronic kidney disease. Give Epogen 4000 units IV with dialysis today.  4. Secondary hyperparathyroidism. Phosphorus currently 4.9 indicating good control.  PTH currently 114.  Will monitor both as outpt.    LOS: 2 Dartagnan Beavers 8/31/201812:34 PM

## 2017-04-21 NOTE — Progress Notes (Signed)
HD COMPLETED  

## 2017-04-21 NOTE — Progress Notes (Signed)
Inpatient Diabetes Program Recommendations  AACE/ADA: New Consensus Statement on Inpatient Glycemic Control (2015)  Target Ranges:  Prepandial:   less than 140 mg/dL      Peak postprandial:   less than 180 mg/dL (1-2 hours)      Critically ill patients:  140 - 180 mg/dL   Lab Results  Component Value Date   GLUCAP 132 (H) 04/21/2017   HGBA1C 7.6 (H) 08/24/2015    Review of Glycemic Control  Results for Marykay LexICKARD, [name] (MRN [id number]) as of 04/21/2017 14:23  Ref. Range 04/20/2017 08:42 04/20/2017 11:21 04/20/2017 17:02 04/20/2017 20:34 04/21/2017 07:48  Glucose-Capillary Latest Ref Range: 65 - 99 mg/dL 73 478116 (H) 295135 (H) 621268 (H) 132 (H)    Diabetes history: Type 2 Outpatient Diabetes medications: Levemir 15 units qday Current orders for Inpatient glycemic control: Novolog 0-9 units tid  Inpatient Diabetes Program Recommendations: Consider Levemir 7 units qhs   Per ADA recommendations "consider performing an A1C on all patients with diabetes or hyperglycemia admitted to the hospital if not performed in the prior 3 months".  Susette RacerJulie Jenesis Suchy, RN, BA, MHA, CDE Diabetes Coordinator Inpatient Diabetes Program  [phone] (Team Pager) [phone] [facility] Office) 04/21/2017 2:27 PM

## 2017-04-21 NOTE — Progress Notes (Signed)
Discharge instructions reviewed with patient and wife. Pt/ wife verbalized understanding. IV removed, pressure dressing applied. NAD noted upon discharge.

## 2017-04-21 NOTE — Progress Notes (Signed)
PT Cancellation Note  Patient Details Name: Kenneth [name] MRN: [id number] DOB: 1927-10-07   Cancelled Treatment:    Reason Eval/Treat Not Completed: Patient at procedure or test/unavailable.  Pt currently off floor for dialysis.  Will re-attempt PT treatment at a later date/time.  Hendricks LimesEmily Jehan Bonano, PT 04/21/17, 9:53 AM [phone]

## 2017-04-21 NOTE — Progress Notes (Signed)
POST DIALYSIS ASSESSMENT 

## 2017-04-21 NOTE — Progress Notes (Signed)
This note also relates to the following rows which could not be included: Pulse Rate - Cannot attach notes to unvalidated device data Resp - Cannot attach notes to unvalidated device data BP - Cannot attach notes to unvalidated device data SpO2 - Cannot attach notes to unvalidated device data  Hd started  

## 2017-04-21 NOTE — Care Management Important Message (Signed)
Important Message  Patient Details  Name: Kenneth [name] MRN: [id number] Date of Birth: Dec 28, 1927   Medicare Important Message Given:  N/A - LOS <3 / Initial given by admissions    Eber HongGreene, Arynn Armand R, RN 04/21/2017, 1:59 PM

## 2017-04-21 NOTE — Care Management (Signed)
Patient for discharge home today.  There is recommendation for home health physical therapy and agency preference is [facility].  Referral called for SN and PT and accepted.  Patient has chronic oxygen and walker at home.  Notified Kenneth [name] with [facility] of discharge

## 2017-04-21 NOTE — Progress Notes (Signed)
PRE DIALYSIS ASSESSMENT 

## 2017-04-24 LAB — CULTURE, BLOOD (ROUTINE X 2)
CULTURE: NO GROWTH
Culture: NO GROWTH

## 2017-04-27 ENCOUNTER — Ambulatory Visit: Payer: Medicare Other | Admitting: Cardiovascular Disease

## 2017-05-29 ENCOUNTER — Other Ambulatory Visit: Payer: Self-pay | Admitting: Internal Medicine

## 2017-05-29 DIAGNOSIS — J90 Pleural effusion, not elsewhere classified: Secondary | ICD-10-CM

## 2017-05-29 NOTE — Discharge Instructions (Signed)
Thoracentesis, Care After °Refer to this sheet in the next few weeks. These instructions provide you with information about caring for yourself after your procedure. Your health care provider may also give you more specific instructions. Your treatment has been planned according to current medical practices, but problems sometimes occur. Call your health care provider if you have any problems or questions after your procedure. °What can I expect after the procedure? °After your procedure, it is common to have pain at the puncture site. °Follow these instructions at home: °· Take medicines only as directed by your health care provider. °· You may return to your normal diet and normal activities as directed by your health care provider. °· Drink enough fluid to keep your urine clear or pale yellow. °· Do not take baths, swim, or use a hot tub until your health care provider approves. °· Follow your health care provider's instructions about: °? Puncture site care. °? Bandage (dressing) changes and removal. °· Check your puncture site every day for signs of infection. Watch for: °? Redness, swelling, or pain. °? Fluid, blood, or pus. °· Keep all follow-up visits as directed by your health care provider. This is important. °Contact a health care provider if: °· You have redness, swelling, or pain at your puncture site. °· You have fluid, blood, or pus coming from your puncture site. °· You have a fever. °· You have chills. °· You have nausea or vomiting. °· You have trouble breathing. °· You develop a worsening cough. °Get help right away if: °· You have extreme shortness of breath. °· You develop chest pain. °· You faint or feel light-headed. °This information is not intended to replace advice given to you by your health care provider. Make sure you discuss any questions you have with your health care provider. °Document Released: 08/29/2014 Document Revised: 04/09/2016 Document Reviewed: 05/20/2014 °Elsevier  Interactive Patient Education © 2018 Elsevier Inc. ° °

## 2017-05-30 ENCOUNTER — Ambulatory Visit
Admission: RE | Admit: 2017-05-30 | Discharge: 2017-05-30 | Disposition: A | Discharge status: Home / Self Care | Payer: Medicare Other | Source: Ambulatory Visit | Attending: Radiology | Admitting: Radiology

## 2017-05-30 ENCOUNTER — Ambulatory Visit
Admission: RE | Admit: 2017-05-30 | Discharge: 2017-05-30 | Disposition: A | Discharge status: Home / Self Care | Payer: Medicare Other | Source: Ambulatory Visit | Attending: Internal Medicine | Admitting: Internal Medicine

## 2017-05-30 DIAGNOSIS — J90 Pleural effusion, not elsewhere classified: Secondary | ICD-10-CM | POA: Diagnosis present

## 2017-05-30 DIAGNOSIS — Z9889 Other specified postprocedural states: Secondary | ICD-10-CM | POA: Diagnosis present

## 2017-05-30 LAB — BODY FLUID CELL COUNT WITH DIFFERENTIAL
EOS FL: 1 %
LYMPHS FL: 92 %
Monocyte-Macrophage-Serous Fluid: 6 %
NEUTROPHIL FLUID: 1 %
OTHER CELLS FL: 0 %
Total Nucleated Cell Count, Fluid: 349 cu mm

## 2017-05-30 LAB — LACTATE DEHYDROGENASE, PLEURAL OR PERITONEAL FLUID: LD FL: 59 U/L — AB (ref 3–23)

## 2017-05-30 LAB — PROTEIN, PLEURAL OR PERITONEAL FLUID

## 2017-05-30 NOTE — Procedures (Signed)
Ultrasound-guided diagnostic and therapeutic right thoracentesis performed yielding 950 cc of yellow fluid. No immediate complications. Follow-up chest x-ray pending. The fluid was sent to the lab for preordered studies.        

## 2017-05-31 LAB — MISC LABCORP TEST (SEND OUT): LABCORP TEST CODE: 19588

## 2017-06-01 LAB — CYTOLOGY - NON PAP

## 2017-07-04 ENCOUNTER — Encounter: Payer: Self-pay | Admitting: Urology

## 2017-07-04 ENCOUNTER — Ambulatory Visit (INDEPENDENT_AMBULATORY_CARE_PROVIDER_SITE_OTHER): Payer: Medicare Other | Admitting: Urology

## 2017-07-04 VITALS — BP 110/59 | HR 95 | Ht 70.0 in | Wt 160.0 lb

## 2017-07-04 DIAGNOSIS — Z87442 Personal history of urinary calculi: Secondary | ICD-10-CM | POA: Diagnosis not present

## 2017-07-04 DIAGNOSIS — R3129 Other microscopic hematuria: Secondary | ICD-10-CM

## 2017-07-04 LAB — MICROSCOPIC EXAMINATION: RBC, UA: >30 /hpf — AB (ref 0–?)

## 2017-07-04 LAB — URINALYSIS, COMPLETE
BILIRUBIN UA: NEGATIVE
Ketones, UA: NEGATIVE
LEUKOCYTES UA: NEGATIVE
Nitrite, UA: NEGATIVE
PH UA: 7 (ref 5.0–7.5)
Specific Gravity, UA: 1.02 (ref 1.005–1.030)
Urobilinogen, Ur: 0.2 mg/dL (ref 0.2–1.0)

## 2017-07-05 DIAGNOSIS — R3129 Other microscopic hematuria: Secondary | ICD-10-CM | POA: Insufficient documentation

## 2017-07-05 NOTE — Progress Notes (Signed)
07/04/2017 7:52 AM   Kenneth [name] 27-Oct-1927 [id number]  Referring provider: Barbette Reichmann, MD [address] [facility] [place], [place] [address]  Chief Complaint  Patient presents with  . Hematuria    HPI: Kenneth [name] is an 83 y.o. male seen at the request of Dr. Marcello Fennel for evaluation of microscopic hematuria.  He has chronic kidney disease and has been on dialysis.  He is oliguric and does have urinary urgency.  Denies gross hematuria.  He denies flank, abdominal, pelvic or scrotal pain.  He had a positive urine culture for Acinetobacter baumannii in September 2018 however it is difficult to tell if he was symptomatic.  He has had urinalysis showing 10-50 RBCs.  A noncontrast CT of the abdomen and pelvis was performed in March 2017 which showed no renal masses, hydronephrosis or bladder abnormalities.  A urine culture on 10/2 grew mixed flora.  He has a prior history of stone disease and I saw him in 2013 for a left ureteral calculus treated with ureteroscopic removal.   PMH: Past Medical History:  Diagnosis Date  . Anemia   . Anxiety   . Arthritis   . B12 deficiency   . Coronary artery disease    a. 2vCABG LIMA-LAD, SVG-OM1 in 1999; b. Lexiscan myoview (5/10) showed EF 45%, HK apex & lateral wall, fixed perfusion defect involving apex, inferoapex, & inferolateral wall, also some inferoapical ischemia; LHC (5/10) showed patent LIMA-LAD and totally occluded SVG-OM1, 99% pLAD, 40% ostial CFX, 40% mCFX, 30% OM1, 20% pRCA  . Diabetes mellitus   . ESRD on hemodialysis (HCC)    a. once weekly as of 08/2015. Currently 3xweek since 09/11/15.  Marland Kitchen GERD (gastroesophageal reflux disease)   . History of MRI of chest 11/10   a. Cardiac; EF 48%, mid anterolateral and inferolateral walls, apical lateral wall, apical septal wall, and true apex were all think and akinetic; 51-75% thickness subendocardial delayed enhancement in mid anterolateral and inferolateral walls, full  thickness enhancement in apical laterla and apical septal walls and true apex  . Hyperlipidemia   . Hypertension   . Ischemic cardiomyopathy    a. likely ICM, EF 55-60%, RWMA could not be excluded, nl PASP, nl CVP  . Nephrolithiasis   . Obesity   . Occasional tremors   . OSA (obstructive sleep apnea)    Moderate CPAP  . Shortness of breath dyspnea   . Spinal stenosis     Surgical History: Past Surgical History:  Procedure Laterality Date  . BACK SURGERY     Multiple, with spinal stenosis  . CARDIAC CATHETERIZATION    . CATARACT EXTRACTION    . CORONARY ARTERY BYPASS GRAFT  1999   In [place] with LIMA-LAD and SVG-OM1  . EYE SURGERY Bilateral    Cataract extraction with IOL  . INSERTION OF DIALYSIS CATHETER Right    Dr. Wyn Quaker, [facility]  . JOINT REPLACEMENT Left    Total Knee Replacement  . TOTAL KNEE ARTHROPLASTY  5/10    Home Medications:  Allergies as of 07/04/2017      Reactions   Bee Venom    Beef-derived Products Hives   Iodine Hives   Ivp Dye [iodinated Diagnostic Agents] Hives      Medication List        Accurate as of 07/04/17 11:59 PM. Always use your most recent med list.          aspirin EC 81 MG tablet Take 81 mg by mouth daily.  calcium acetate 667 MG capsule Commonly known as:  PHOSLO Take 2,001 mg by mouth 3 (three) times daily.   feeding supplement (NEPRO CARB STEADY) Liqd Take 237 mLs by mouth daily.   ferrous sulfate 325 (65 FE) MG tablet Take 325 mg by mouth daily.   freestyle lancets Use 3 times a day to check blood sugar.   lidocaine-prilocaine cream Commonly known as:  EMLA Apply 2.5 application topically as needed.   lovastatin 20 MG tablet Commonly known as:  MEVACOR Take 20 mg by mouth at bedtime.   megestrol 20 MG tablet Commonly known as:  MEGACE Take 20 mg by mouth daily.   metoprolol succinate 25 MG 24 hr tablet Commonly known as:  TOPROL XL Take 0.5 tablets (12.5 mg total) by mouth daily.   nitroGLYCERIN 0.4 MG  SL tablet Commonly known as:  NITROSTAT Place 1 tablet (0.4 mg total) under the tongue every 5 (five) minutes as needed for chest pain.   SENSIPAR 30 MG tablet Generic drug:  cinacalcet Take 1 tablet by mouth daily.   sertraline 25 MG tablet Commonly known as:  ZOLOFT Take 1 tablet by mouth daily.   SPIRIVA HANDIHALER 18 MCG inhalation capsule Generic drug:  tiotropium Place 1 capsule into inhaler and inhale daily.   tamsulosin 0.4 MG Caps capsule Commonly known as:  FLOMAX Take 1 capsule (0.4 mg total) by mouth daily.   traZODone 50 MG tablet Commonly known as:  DESYREL Take 1 tablet by mouth at bedtime.   VOL-CARE RX 1 MG Tabs Take 1 tablet by mouth every evening.       Allergies:  Allergies  Allergen Reactions  . Bee Venom   . Beef-Derived Products Hives  . Iodine Hives  . Ivp Dye [Iodinated Diagnostic Agents] Hives    Family History: Family History  Problem Relation Age of Onset  . Kidney failure Mother   . Heart failure Father     Social History:  reports that he has quit smoking. His smoking use included cigarettes. he has never used smokeless tobacco. He reports that he does not drink alcohol or use drugs.  ROS: UROLOGY Frequent Urination?: No Hard to postpone urination?: No Burning/pain with urination?: No Get up at night to urinate?: No Leakage of urine?: Yes Urine stream starts and stops?: No Trouble starting stream?: No Do you have to strain to urinate?: No Blood in urine?: Yes Urinary tract infection?: No Sexually transmitted disease?: No Injury to kidneys or bladder?: No Painful intercourse?: No Weak stream?: No Erection problems?: No Penile pain?: No  Gastrointestinal Nausea?: No Vomiting?: No Indigestion/heartburn?: No Diarrhea?: No Constipation?: No  Constitutional Fever: No Night sweats?: No Weight loss?: No Fatigue?: No  Skin Skin rash/lesions?: No Itching?: No  Eyes Blurred vision?: No Double vision?:  No  Ears/Nose/Throat Sore throat?: No Sinus problems?: No  Hematologic/Lymphatic Swollen glands?: No Easy bruising?: No  Cardiovascular Leg swelling?: Yes   Physical Exam: BP (!) 110/59 (BP Location: Left Arm, Patient Position: Sitting, Cuff Size: Normal)   Pulse 95   Ht 5\' 10"  (1.778 m)   Wt 160 lb (72.6 kg)   BMI 22.96 kg/m   Constitutional:  Alert and oriented, No acute distress. HEENT: [place] AT, moist mucus membranes.  Trachea midline, no masses. Cardiovascular: No clubbing, cyanosis, or edema. Respiratory: Normal respiratory effort, no increased work of breathing. GI: Abdomen is soft, nontender, nondistended, no abdominal masses GU: No CVA tenderness.  Skin: No rashes, bruises or suspicious lesions. Lymph: No cervical  or inguinal adenopathy. Neurologic: Grossly intact, no focal deficits, moving all 4 extremities. Psychiatric: Normal mood and affect.  Laboratory Data: Lab Results  Component Value Date   WBC 5.9 04/19/2017   HGB 9.1 (L) 04/19/2017   HCT 29.3 (L) 04/19/2017   MCV 97.3 04/19/2017   PLT 366 04/19/2017    Lab Results  Component Value Date   CREATININE 4.62 (H) 04/19/2017     Lab Results  Component Value Date   HGBA1C 7.6 (H) 08/24/2015    Urinalysis Lab Results  Component Value Date   SPECGRAV 1.020 07/04/2017   PHUR 7.0 07/04/2017   COLORU Yellow 07/04/2017   APPEARANCEUR Slightly cloudy 07/04/2017   LEUKOCYTESUR Negative 07/04/2017   PROTEINUR 3+ (A) 07/04/2017   GLUCOSEU Trace 07/04/2017   KETONESU Negative 07/04/2017   RBCU 3+ (A) 07/04/2017   BILIRUBINUR Negative 07/04/2017   UUROB 0.2 07/04/2017   NITRITE Negative 07/04/2017    Lab Results  Component Value Date   LABMICR See below: 07/04/2017   WBCUA 0-5 07/04/2017   RBCUA >30 (A) 07/04/2017   LABEPIT 0-10 07/04/2017   MUCUS Present (A) 07/04/2017   BACTERIA Moderate (A) 07/04/2017    Pertinent Imaging:  Results for orders placed during the hospital encounter of  08/24/15  US Renal   Narrative CLINICAL DATA:  Acute renal failure  EXAM: RENAL / URINARY TRACT ULTRASOUND COMPLETE  COMPARISON:  02/22/2014  FINDINGS: Right Kidney:  Length: 10.8 cm. Normal echogenicity. Mild cortical thinning probable due to atrophy stable from prior exam. No hydronephrosis. No renal calculus.  Left Kidney:  Length: 12.6 cm in length. Echogenicity within normal limits. No mass or hydronephrosis visualized.  Bladder:  Appears normal for degree of bladder distention.  IMPRESSION: No hydronephrosis or renal calculi. Unremarkable urinary bladder. Stable mild cortical thinning right kidney probable due to atrophy.   Electronically Signed   By: Natasha MeadLiviu  Pop M.D.   On: 08/28/2015 12:10     Assessment & Plan:    1. Microscopic hematuria Urinalysis today showed greater than 30 RBCs.  I discussed with Kenneth [name] and his daughter the standard evaluation of upper tract imaging and cystoscopy.  He had a renal ultrasound and CT in 2017 which was unremarkable.  He declined to repeat upper tract imaging and has also declined cystoscopy.  He is aware of the possibility of an undiagnosed malignancy and states based on his age and overall health he is willing to accept this risk.  He will follow-up as needed and was instructed to call should he develop gross hematuria.  - Urinalysis, Complete  2. Personal history of urinary calculi  Return if symptoms worsen or fail to improve.  Riki AltesScott C Tylin Force, MD  [facility] [address], [address] [place], [place] [address] [phone]

## 2017-07-07 ENCOUNTER — Inpatient Hospital Stay
Admission: EM | Admit: 2017-07-07 | Discharge: 2017-07-14 | DRG: 291 | Disposition: A | Discharge status: Skilled Nursing Facility | Payer: Medicare Other | Attending: Internal Medicine | Admitting: Internal Medicine

## 2017-07-07 ENCOUNTER — Emergency Department: Payer: Medicare Other

## 2017-07-07 DIAGNOSIS — Z951 Presence of aortocoronary bypass graft: Secondary | ICD-10-CM

## 2017-07-07 DIAGNOSIS — I132 Hypertensive heart and chronic kidney disease with heart failure and with stage 5 chronic kidney disease, or end stage renal disease: Secondary | ICD-10-CM | POA: Diagnosis present

## 2017-07-07 DIAGNOSIS — Z66 Do not resuscitate: Secondary | ICD-10-CM

## 2017-07-07 DIAGNOSIS — J441 Chronic obstructive pulmonary disease with (acute) exacerbation: Secondary | ICD-10-CM | POA: Diagnosis present

## 2017-07-07 DIAGNOSIS — Z96652 Presence of left artificial knee joint: Secondary | ICD-10-CM | POA: Diagnosis present

## 2017-07-07 DIAGNOSIS — Z515 Encounter for palliative care: Secondary | ICD-10-CM | POA: Diagnosis not present

## 2017-07-07 DIAGNOSIS — Z888 Allergy status to other drugs, medicaments and biological substances status: Secondary | ICD-10-CM

## 2017-07-07 DIAGNOSIS — E785 Hyperlipidemia, unspecified: Secondary | ICD-10-CM | POA: Diagnosis present

## 2017-07-07 DIAGNOSIS — D631 Anemia in chronic kidney disease: Secondary | ICD-10-CM | POA: Diagnosis present

## 2017-07-07 DIAGNOSIS — J90 Pleural effusion, not elsewhere classified: Secondary | ICD-10-CM | POA: Diagnosis present

## 2017-07-07 DIAGNOSIS — J9611 Chronic respiratory failure with hypoxia: Secondary | ICD-10-CM

## 2017-07-07 DIAGNOSIS — N2581 Secondary hyperparathyroidism of renal origin: Secondary | ICD-10-CM | POA: Diagnosis present

## 2017-07-07 DIAGNOSIS — I959 Hypotension, unspecified: Secondary | ICD-10-CM | POA: Diagnosis present

## 2017-07-07 DIAGNOSIS — G4733 Obstructive sleep apnea (adult) (pediatric): Secondary | ICD-10-CM | POA: Diagnosis present

## 2017-07-07 DIAGNOSIS — E1122 Type 2 diabetes mellitus with diabetic chronic kidney disease: Secondary | ICD-10-CM | POA: Diagnosis present

## 2017-07-07 DIAGNOSIS — J9621 Acute and chronic respiratory failure with hypoxia: Secondary | ICD-10-CM | POA: Diagnosis present

## 2017-07-07 DIAGNOSIS — I255 Ischemic cardiomyopathy: Secondary | ICD-10-CM | POA: Diagnosis present

## 2017-07-07 DIAGNOSIS — I5033 Acute on chronic diastolic (congestive) heart failure: Secondary | ICD-10-CM | POA: Diagnosis present

## 2017-07-07 DIAGNOSIS — R0600 Dyspnea, unspecified: Secondary | ICD-10-CM

## 2017-07-07 DIAGNOSIS — Z87891 Personal history of nicotine dependence: Secondary | ICD-10-CM

## 2017-07-07 DIAGNOSIS — Z91041 Radiographic dye allergy status: Secondary | ICD-10-CM

## 2017-07-07 DIAGNOSIS — I509 Heart failure, unspecified: Secondary | ICD-10-CM | POA: Diagnosis not present

## 2017-07-07 DIAGNOSIS — Z9981 Dependence on supplemental oxygen: Secondary | ICD-10-CM | POA: Diagnosis not present

## 2017-07-07 DIAGNOSIS — Z7982 Long term (current) use of aspirin: Secondary | ICD-10-CM | POA: Diagnosis not present

## 2017-07-07 DIAGNOSIS — Z91030 Bee allergy status: Secondary | ICD-10-CM

## 2017-07-07 DIAGNOSIS — Z9111 Patient's noncompliance with dietary regimen: Secondary | ICD-10-CM | POA: Diagnosis not present

## 2017-07-07 DIAGNOSIS — N186 End stage renal disease: Secondary | ICD-10-CM | POA: Diagnosis present

## 2017-07-07 DIAGNOSIS — I2510 Atherosclerotic heart disease of native coronary artery without angina pectoris: Secondary | ICD-10-CM | POA: Diagnosis present

## 2017-07-07 DIAGNOSIS — K219 Gastro-esophageal reflux disease without esophagitis: Secondary | ICD-10-CM | POA: Diagnosis present

## 2017-07-07 DIAGNOSIS — R0602 Shortness of breath: Secondary | ICD-10-CM

## 2017-07-07 DIAGNOSIS — F419 Anxiety disorder, unspecified: Secondary | ICD-10-CM | POA: Diagnosis present

## 2017-07-07 DIAGNOSIS — Z992 Dependence on renal dialysis: Secondary | ICD-10-CM

## 2017-07-07 DIAGNOSIS — R0902 Hypoxemia: Secondary | ICD-10-CM

## 2017-07-07 DIAGNOSIS — I340 Nonrheumatic mitral (valve) insufficiency: Secondary | ICD-10-CM | POA: Diagnosis not present

## 2017-07-07 DIAGNOSIS — R627 Adult failure to thrive: Secondary | ICD-10-CM | POA: Diagnosis not present

## 2017-07-07 DIAGNOSIS — J9601 Acute respiratory failure with hypoxia: Secondary | ICD-10-CM | POA: Diagnosis not present

## 2017-07-07 LAB — COMPREHENSIVE METABOLIC PANEL
ALBUMIN: 3.1 g/dL — AB (ref 3.5–5.0)
ALT: 7 U/L — ABNORMAL LOW (ref 17–63)
ANION GAP: 11 (ref 5–15)
AST: 21 U/L (ref 15–41)
Alkaline Phosphatase: 76 U/L (ref 38–126)
BILIRUBIN TOTAL: 0.7 mg/dL (ref 0.3–1.2)
BUN: 11 mg/dL (ref 6–20)
CHLORIDE: 94 mmol/L — AB (ref 101–111)
CO2: 32 mmol/L (ref 22–32)
Calcium: 8.5 mg/dL — ABNORMAL LOW (ref 8.9–10.3)
Creatinine, Ser: 1.81 mg/dL — ABNORMAL HIGH (ref 0.61–1.24)
GFR calc Af Amer: 37 mL/min — ABNORMAL LOW (ref >60)
GFR calc non Af Amer: 32 mL/min — ABNORMAL LOW (ref >60)
GLUCOSE: 155 mg/dL — AB (ref 65–99)
POTASSIUM: 3.4 mmol/L — AB (ref 3.5–5.1)
SODIUM: 137 mmol/L (ref 135–145)
Total Protein: 8.1 g/dL (ref 6.5–8.1)

## 2017-07-07 LAB — DIFFERENTIAL
BAND NEUTROPHILS: 2 %
BASOS PCT: 0 %
BLASTS: 0 %
Basophils Absolute: 0 10*3/uL (ref 0–0.1)
EOS PCT: 1 %
Eosinophils Absolute: 0.1 10*3/uL (ref 0–0.7)
Lymphocytes Relative: 9 %
Lymphs Abs: 0.6 10*3/uL — ABNORMAL LOW (ref 1.0–3.6)
MONOS PCT: 3 %
Metamyelocytes Relative: 0 %
Monocytes Absolute: 0.2 10*3/uL (ref 0.2–1.0)
Myelocytes: 0 %
NEUTROS ABS: 5.6 10*3/uL (ref 1.4–6.5)
NEUTROS PCT: 85 %
NRBC: 0 /100{WBCs}
OTHER: 0 %
PROMYELOCYTES ABS: 0 %

## 2017-07-07 LAB — CBC
HCT: 32.9 % — ABNORMAL LOW (ref 40.0–52.0)
HEMOGLOBIN: 10.3 g/dL — AB (ref 13.0–18.0)
MCH: 29.6 pg (ref 26.0–34.0)
MCHC: 31.2 g/dL — ABNORMAL LOW (ref 32.0–36.0)
MCV: 94.9 fL (ref 80.0–100.0)
Platelets: 298 10*3/uL (ref 150–440)
RBC: 3.47 MIL/uL — AB (ref 4.40–5.90)
RDW: 20.7 % — ABNORMAL HIGH (ref 11.5–14.5)
WBC: 6.5 10*3/uL (ref 3.8–10.6)

## 2017-07-07 LAB — BRAIN NATRIURETIC PEPTIDE: B Natriuretic Peptide: 3022 pg/mL — ABNORMAL HIGH (ref 0.0–100.0)

## 2017-07-07 LAB — TROPONIN I: Troponin I: 0.06 ng/mL (ref <0.03)

## 2017-07-07 LAB — GLUCOSE, CAPILLARY: GLUCOSE-CAPILLARY: 193 mg/dL — AB (ref 65–99)

## 2017-07-07 MED ORDER — CALCIUM ACETATE (PHOS BINDER) 667 MG PO CAPS
2001.0000 mg | ORAL_CAPSULE | Freq: Three times a day (TID) | ORAL | Status: DC
  Administered 2017-07-08 – 2017-07-14 (×15): 2001 mg via ORAL
  Filled 2017-07-07 (×19): qty 3

## 2017-07-07 MED ORDER — POTASSIUM CHLORIDE 20 MEQ PO PACK
40.0000 meq | PACK | Freq: Once | ORAL | Status: AC
  Administered 2017-07-07: 40 meq via ORAL

## 2017-07-07 MED ORDER — ONDANSETRON HCL 4 MG PO TABS: 4.0000 mg | ORAL_TABLET | Freq: Four times a day (QID) | ORAL | Status: DC | PRN

## 2017-07-07 MED ORDER — MEGESTROL ACETATE 20 MG PO TABS
20.0000 mg | ORAL_TABLET | Freq: Every day | ORAL | Status: DC
Start: 2017-07-08 — End: 2017-07-14
  Administered 2017-07-09 – 2017-07-14 (×6): 20 mg via ORAL
  Filled 2017-07-07 (×2): qty 0.5
  Filled 2017-07-07 (×3): qty 1
  Filled 2017-07-07: qty 0.5
  Filled 2017-07-07: qty 1

## 2017-07-07 MED ORDER — HEPARIN SODIUM (PORCINE) 5000 UNIT/ML IJ SOLN
5000.0000 [IU] | Freq: Three times a day (TID) | INTRAMUSCULAR | Status: DC
  Administered 2017-07-08 – 2017-07-14 (×17): 5000 [IU] via SUBCUTANEOUS
  Filled 2017-07-07 (×17): qty 1

## 2017-07-07 MED ORDER — TRAZODONE HCL 50 MG PO TABS
50.0000 mg | ORAL_TABLET | Freq: Every day | ORAL | Status: DC
  Administered 2017-07-08 – 2017-07-13 (×7): 50 mg via ORAL
  Filled 2017-07-07 (×7): qty 1

## 2017-07-07 MED ORDER — FUROSEMIDE 10 MG/ML IJ SOLN
INTRAMUSCULAR | Status: AC
  Filled 2017-07-07: qty 4

## 2017-07-07 MED ORDER — PRAVASTATIN SODIUM 20 MG PO TABS
20.0000 mg | ORAL_TABLET | Freq: Every day | ORAL | Status: DC
  Administered 2017-07-08 – 2017-07-14 (×7): 20 mg via ORAL
  Filled 2017-07-07 (×8): qty 1

## 2017-07-07 MED ORDER — FUROSEMIDE 10 MG/ML IJ SOLN
INTRAMUSCULAR | Status: AC
  Administered 2017-07-07: 120 mg via INTRAVENOUS
  Filled 2017-07-07: qty 10

## 2017-07-07 MED ORDER — SENNA 8.6 MG PO TABS
1.0000 | ORAL_TABLET | Freq: Two times a day (BID) | ORAL | Status: DC
  Administered 2017-07-08 – 2017-07-14 (×12): 8.6 mg via ORAL
  Filled 2017-07-07 (×12): qty 1

## 2017-07-07 MED ORDER — FERROUS SULFATE 325 (65 FE) MG PO TABS
325.0000 mg | ORAL_TABLET | Freq: Every day | ORAL | Status: DC
  Administered 2017-07-09 – 2017-07-14 (×6): 325 mg via ORAL
  Filled 2017-07-07 (×6): qty 1

## 2017-07-07 MED ORDER — TIOTROPIUM BROMIDE MONOHYDRATE 18 MCG IN CAPS
1.0000 | ORAL_CAPSULE | Freq: Every day | RESPIRATORY_TRACT | Status: DC
  Administered 2017-07-09: 18 ug via RESPIRATORY_TRACT
  Filled 2017-07-07: qty 5

## 2017-07-07 MED ORDER — ACETAMINOPHEN 325 MG PO TABS: 650.0000 mg | ORAL_TABLET | Freq: Four times a day (QID) | ORAL | Status: DC | PRN

## 2017-07-07 MED ORDER — ONDANSETRON HCL 4 MG/2ML IJ SOLN
4.0000 mg | Freq: Four times a day (QID) | INTRAMUSCULAR | Status: DC | PRN
  Administered 2017-07-10: 4 mg via INTRAVENOUS
  Filled 2017-07-07: qty 2

## 2017-07-07 MED ORDER — VOL-CARE RX 1 MG PO TABS: 1.0000 | ORAL_TABLET | Freq: Every evening | ORAL | Status: DC

## 2017-07-07 MED ORDER — FUROSEMIDE 10 MG/ML IJ SOLN
120.0000 mg | Freq: Once | INTRAMUSCULAR | Status: AC
  Administered 2017-07-07: 120 mg via INTRAVENOUS

## 2017-07-07 MED ORDER — METOPROLOL SUCCINATE ER 25 MG PO TB24
12.5000 mg | ORAL_TABLET | Freq: Every day | ORAL | Status: DC
  Administered 2017-07-09 – 2017-07-14 (×3): 12.5 mg via ORAL
  Filled 2017-07-07 (×4): qty 1
  Filled 2017-07-07 (×3): qty 0.5

## 2017-07-07 MED ORDER — B COMPLEX-C PO TABS: 1.0000 | ORAL_TABLET | Freq: Every day | ORAL | Status: DC

## 2017-07-07 MED ORDER — TORSEMIDE 100 MG PO TABS
100.0000 mg | ORAL_TABLET | Freq: Every day | ORAL | Status: DC
  Filled 2017-07-07: qty 1

## 2017-07-07 MED ORDER — SERTRALINE HCL 50 MG PO TABS
25.0000 mg | ORAL_TABLET | Freq: Every day | ORAL | Status: DC
  Administered 2017-07-09 – 2017-07-14 (×6): 25 mg via ORAL
  Filled 2017-07-07 (×6): qty 1

## 2017-07-07 MED ORDER — POTASSIUM CHLORIDE CRYS ER 20 MEQ PO TBCR: 40.0000 meq | EXTENDED_RELEASE_TABLET | Freq: Once | ORAL | Status: DC

## 2017-07-07 MED ORDER — ASPIRIN EC 81 MG PO TBEC: 81.0000 mg | DELAYED_RELEASE_TABLET | Freq: Every day | ORAL | Status: DC

## 2017-07-07 MED ORDER — INSULIN ASPART 100 UNIT/ML ~~LOC~~ SOLN
0.0000 [IU] | Freq: Three times a day (TID) | SUBCUTANEOUS | Status: DC
  Administered 2017-07-08: 2 [IU] via SUBCUTANEOUS
  Administered 2017-07-09 (×2): 1 [IU] via SUBCUTANEOUS
  Administered 2017-07-09: 3 [IU] via SUBCUTANEOUS
  Administered 2017-07-10: 2 [IU] via SUBCUTANEOUS
  Administered 2017-07-10: 1 [IU] via SUBCUTANEOUS
  Administered 2017-07-11: 3 [IU] via SUBCUTANEOUS
  Administered 2017-07-12 (×2): 1 [IU] via SUBCUTANEOUS
  Administered 2017-07-12: 3 [IU] via SUBCUTANEOUS
  Administered 2017-07-13: 1 [IU] via SUBCUTANEOUS
  Administered 2017-07-13: 2 [IU] via SUBCUTANEOUS
  Administered 2017-07-13 – 2017-07-14 (×3): 1 [IU] via SUBCUTANEOUS
  Filled 2017-07-07 (×15): qty 1

## 2017-07-07 MED ORDER — TAMSULOSIN HCL 0.4 MG PO CAPS
0.4000 mg | ORAL_CAPSULE | Freq: Every day | ORAL | Status: DC
  Administered 2017-07-09 – 2017-07-14 (×6): 0.4 mg via ORAL
  Filled 2017-07-07 (×6): qty 1

## 2017-07-07 MED ORDER — CINACALCET HCL 30 MG PO TABS
30.0000 mg | ORAL_TABLET | Freq: Every day | ORAL | Status: DC
  Administered 2017-07-09 – 2017-07-14 (×6): 30 mg via ORAL
  Filled 2017-07-07 (×7): qty 1

## 2017-07-07 MED ORDER — NEPRO/CARBSTEADY PO LIQD
237.0000 mL | ORAL | Status: DC
  Administered 2017-07-08 – 2017-07-10 (×2): 237 mL via ORAL

## 2017-07-07 MED ORDER — IPRATROPIUM-ALBUTEROL 0.5-2.5 (3) MG/3ML IN SOLN
3.0000 mL | Freq: Once | RESPIRATORY_TRACT | Status: AC
  Administered 2017-07-07: 3 mL via RESPIRATORY_TRACT
  Filled 2017-07-07: qty 3

## 2017-07-07 MED ORDER — ACETAMINOPHEN 650 MG RE SUPP
650.0000 mg | Freq: Four times a day (QID) | RECTAL | Status: DC | PRN
Start: 2017-07-07 — End: 2017-07-14

## 2017-07-07 MED ORDER — ALBUTEROL SULFATE (2.5 MG/3ML) 0.083% IN NEBU: 2.5000 mg | INHALATION_SOLUTION | Freq: Four times a day (QID) | RESPIRATORY_TRACT | Status: DC | PRN

## 2017-07-07 MED ORDER — FUROSEMIDE 10 MG/ML IJ SOLN: 60.0000 mg | Freq: Once | INTRAMUSCULAR | Status: DC

## 2017-07-07 NOTE — ED Notes (Signed)
Pt awake, alert and oriented, calm denies any pain at present. Pt's wife at bedside, informed waiting on room assignment to transport to unit no other needs at present.

## 2017-07-07 NOTE — Progress Notes (Signed)
eLink Physician-Brief Progress Note Patient Name: Kenneth [name] DOB: January 28, 1928 MRN: [id number]   Date of Service  07/07/2017  HPI/Events of Note  83 year old male with end-stage renal disease. Admitted with acute on chronic hypoxic respiratory failure. Recurrent pleural effusion. Currently DO NOT RESUSCITATE status. Lasix administered. Plan for hemodialysis tomorrow.   eICU Interventions  Plan of care as per admitting physician.      Intervention Category Evaluation Type: New Patient Evaluation  [name] 07/07/2017, 11:38 PM

## 2017-07-07 NOTE — Progress Notes (Signed)
Patient again short winded.  Sats dropped down in the upper 80s.  Patient was placed back on BiPAP. Pt will be admitted in step down ICU Dr Lonn Georgiaconforti notified

## 2017-07-07 NOTE — ED Notes (Signed)
Mrs. Kenneth [name] left phone number [phone]

## 2017-07-07 NOTE — ED Provider Notes (Signed)
Insert H&P  [facility] Emergency Department Provider Note   ____________________________________________   First MD Initiated Contact with Patient 07/07/17 1614     (approximate)  I have reviewed the triage vital signs and the nursing notes.   HISTORY  Chief Complaint Shortness of Breath and Chest Pain   ZOX:[id number] of present illness: Patient reports she was at dialysis he got his whole treatment and became short of breath.  EMS arrived said he was satting 80-85% on his 2 L.  He got 1 DuoNeb and his sats went up to about the 69 Patient reports he was at dialysis he got his whole treatment and became short of breath. EMS arrived said he was satting 80-85% on his 2 L. He got one DuoNeb and his sats went up to about 90 percent range.% range.  His shortness of breath improved. His shortness of breath improved.  Patient denied any chest pain.  He said he is having chest tightness though and he is still working hard to breathe although he said Patient denied any chest pain. He said he is having chest tightness though and he is still working hard to breathe although he said that the DuoNeb did make him better. that the DuoNeb did make him better.  He still having to take 2 or 3 breaths before he can get out of 2 word sentence. He still having take 2 or 3 breaths before he can get out of 2 word sentences. He is using accessory muscles.  I have absolutely no idea why the computer is duplicating every sentence.I have absolutely no idea why the computer is duplicating every sentence.   Past Medical History:  Diagnosis Date  . Anemia   . Anxiety   . Arthritis   . B12 deficiency   . Coronary artery disease    a. 2vCABG LIMA-LAD, SVG-OM1 in 1999; b. Lexiscan myoview (5/10) showed EF 45%, HK apex & lateral wall, fixed perfusion defect involving apex, inferoapex, & inferolateral wall, also some inferoapical ischemia; LHC (5/10) showed patent LIMA-LAD and totally occluded SVG-OM1,  99% pLAD, 40% ostial CFX, 40% mCFX, 30% OM1, 20% pRCA  . Diabetes mellitus   . ESRD on hemodialysis (HCC)    a. once weekly as of 08/2015. Currently 3xweek since 09/11/15.  Marland Kitchen GERD (gastroesophageal reflux disease)   . History of MRI of chest 11/10   a. Cardiac; EF 48%, mid anterolateral and inferolateral walls, apical lateral wall, apical septal wall, and true apex were all think and akinetic; 51-75% thickness subendocardial delayed enhancement in mid anterolateral and inferolateral walls, full thickness enhancement in apical laterla and apical septal walls and true apex  . Hyperlipidemia   . Hypertension   . Ischemic cardiomyopathy    a. likely ICM, EF 55-60%, RWMA could not be excluded, nl PASP, nl CVP  . Nephrolithiasis   . Obesity   . Occasional tremors   . OSA (obstructive sleep apnea)    Moderate CPAP  . Shortness of breath dyspnea   . Spinal stenosis     Patient Active Problem List   Diagnosis Date Noted  . CHF (congestive heart failure) (HCC) 07/07/2017  . Microscopic hematuria 07/05/2017  . Coronary artery disease of native artery of native heart with stable angina pectoris (HCC) 01/22/2017  . Pulmonary edema 07/15/2016  . Anemia, unspecified 05/20/2016  . ESRD on dialysis (HCC) 09/28/2015  . Ischemic cardiomyopathy   . CKD (chronic kidney disease) stage 4, GFR 15-29 ml/min (HCC) 08/26/2015  .  Sinus tachycardia 08/26/2015  . Demand ischemia (HCC)   . Coronary artery disease involving coronary bypass graft of native heart without angina pectoris   . Ischemic chest pain   . Acute non-ST elevation myocardial infarction (NSTEMI) (HCC) 08/24/2015  . NSTEMI (non-ST elevated myocardial infarction) (HCC)   . Angina pectoris (HCC)   . SOB (shortness of breath)   . Chronic diastolic CHF (congestive heart failure) (HCC)   . Centrilobular emphysema (HCC)   . Coronary artery disease involving coronary bypass graft of native heart with angina pectoris with documented spasm (HCC)   .  Mixed hyperlipidemia   . ED (erectile dysfunction) 10/06/2014  . Type 2 diabetes mellitus with chronic kidney disease (HCC) 05/26/2014  . Personal history of other malignant neoplasm of skin 03/19/2012  . OSA on CPAP 08/19/2009  . DYSPNEA 07/17/2009  . SNORING 07/17/2009  . HYPERLIPIDEMIA-MIXED 07/02/2009  . HYPERTENSION, UNSPECIFIED 07/02/2009  . CORONARY ATHEROSCLEROSIS OF ARTERY BYPASS GRAFT 07/02/2009    Past Surgical History:  Procedure Laterality Date  . A/V Shuntogram/Fistulagram Left 03/22/2016   Performed by Renford Dills, MD at [facility] INVASIVE CV LAB  . BACK SURGERY     Multiple, with spinal stenosis  . BASCILIC VEIN TRANSPOSITION ( STAGE 1 ) Left 12/11/2015   Performed by Renford Dills, MD at [facility] ORS  . CARDIAC CATHETERIZATION    . CATARACT EXTRACTION    . CORONARY ARTERY BYPASS GRAFT  1999   In [place] with LIMA-LAD and SVG-OM1  . Dialysis/Perma Catheter Insertion N/A 09/01/2015   Performed by Renford Dills, MD at [facility] INVASIVE CV LAB  . Dialysis/Perma Catheter Removal Left 02/25/2016   Performed by Annice Needy, MD at [facility] INVASIVE CV LAB  . EYE SURGERY Bilateral    Cataract extraction with IOL  . INSERTION OF DIALYSIS CATHETER Right    Dr. Wyn Quaker, [facility]  . JOINT REPLACEMENT Left    Total Knee Replacement  . TOTAL KNEE ARTHROPLASTY  5/10    Prior to Admission medications   Medication Sig Start Date End Date Taking? Authorizing Provider  aspirin EC 81 MG tablet Take 81 mg by mouth daily.   Yes [provider]  B Complex-C-Folic Acid (VOL-CARE RX) 1 MG TABS Take 1 tablet by mouth every evening.  12/15/16  Yes [provider]  calcium acetate (PHOSLO) 667 MG capsule Take 2,001 mg by mouth 3 (three) times daily.  06/17/16  Yes [provider]  cinacalcet (SENSIPAR) 30 MG tablet Take 1 tablet by mouth daily. 12/17/15  Yes [provider]  ferrous sulfate 325 (65 FE) MG tablet Take 325 mg by mouth daily.   Yes [provider]  Lancets (FREESTYLE) lancets Use 3 times a day to check blood sugar. 04/21/17  Yes Altamese Dilling, MD  lidocaine-prilocaine (EMLA) cream Apply 2.5 application topically as needed.   Yes [provider]  lovastatin (MEVACOR) 20 MG tablet Take 20 mg by mouth at bedtime.   Yes [provider]  megestrol (MEGACE) 20 MG tablet Take 20 mg by mouth daily.   Yes [provider]  metoprolol succinate (TOPROL XL) 25 MG 24 hr tablet Take 0.5 tablets (12.5 mg total) by mouth daily. 04/22/17  Yes Altamese Dilling, MD  nitroGLYCERIN (NITROSTAT) 0.4 MG SL tablet Place 1 tablet (0.4 mg total) under the tongue every 5 (five) minutes as needed for chest pain. 08/17/16 04/19/18 Yes Gollan, Tollie Pizza, MD  Nutritional Supplements (FEEDING SUPPLEMENT, NEPRO CARB STEADY,) LIQD Take  237 mLs by mouth daily. 04/21/17  Yes Altamese DillingVachhani, Vaibhavkumar, MD  sertraline (ZOLOFT) 25 MG tablet Take 1 tablet by mouth daily.  12/07/16  Yes [provider]  SPIRIVA HANDIHALER 18 MCG inhalation capsule Place 1 capsule into inhaler and inhale daily. 05/11/16  Yes [provider]  tamsulosin (FLOMAX) 0.4 MG CAPS capsule Take 1 capsule (0.4 mg total) by mouth daily. 09/04/15  Yes Gouru, Deanna ArtisAruna, MD  torsemide (DEMADEX) 100 MG tablet Take 100 mg daily by mouth.   Yes [provider]  traZODone (DESYREL) 50 MG tablet Take 1 tablet by mouth at bedtime. 05/17/16  Yes [provider]    Allergies Bee venom; Beef-derived products; Iodine; and Ivp dye [iodinated diagnostic agents]  Family History  Problem Relation Age of Onset  . Kidney failure Mother   . Heart failure Father     Social History Social History   Tobacco Use  . Smoking status: Former Smoker    Types: Cigarettes  . Smokeless tobacco: Never Used  Substance Use Topics  . Alcohol use: No  . Drug use: No    Review of Systems  Constitutional: No fever/chills Eyes: No visual changes. ENT:  No sore throat. Cardiovascular: See HPIsee history of present illness. Respiratory: See HPIsee history of present illness Gastrointestinal: No abdominal pain.  No nausea, no vomiting.  No diarrhea.  No constipation. Genitourinary: Negative for dysuria. Musculoskeletal: Negative for back pain. Skin: Negative for rash. Neurological: Negative for headaches, focal weakness or   ____________________________________________   PHYSICAL EXAM:  VITAL SIGNS: ED Triage Vitals [07/07/17 1619]  Enc Vitals Group     BP      Pulse      Resp      Temp      Temp src      SpO2      Weight      Height      Head Circumference      Peak Flow      Pain Score 3     Pain Loc      Pain Edu?      Excl. in GC?     Constitutional: Alert and oriented. Well appearing and in no acute distress. Eyes: Conjunctivae are normal. Head: Atraumatic. Nose: No congestion/rhinnorhea. Mouth/Throat: Mucous membranes are moist.  Oropharynx non-erythematous. Neck: No stridor. Cardiovascular:rapid  ate, regular rhythm. Grossly normal heart sounds.  Good peripheral circulation. Respiratory: increased respiratory effort.  Accessory muscoe use  retractions. Lungs sl wheezed prolongued expiratory phase  Gastrointestinal: Soft and nontender. No distention. No abdominal bruits. No CVA tenderness. Musculoskeletal: No lower extremity tenderness some edema.  No joint effusions. Neurologic:  Normal speech and language. No gross focal neurologic deficits are appreciated.  Skin:  Skin is warm, dry and intact. No rash noted.   ____________________________________________   LABS (all labs ordered are listed, but only abnormal results are displayed)  Labs Reviewed  COMPREHENSIVE METABOLIC PANEL - Abnormal; Notable for the following components:      Result Value   Potassium 3.4 (*)    Chloride 94 (*)    Glucose, Bld 155 (*)    Creatinine, Ser 1.81 (*)    Calcium 8.5 (*)    Albumin 3.1 (*)    ALT 7 (*)    GFR calc non  Af Amer 32 (*)    GFR calc Af Amer 37 (*)    All other components within normal limits  BRAIN NATRIURETIC PEPTIDE - Abnormal; Notable for the following components:  B Natriuretic Peptide 3,022.0 (*)    All other components within normal limits  TROPONIN I - Abnormal; Notable for the following components:   Troponin I 0.06 (*)    All other components within normal limits  CBC - Abnormal; Notable for the following components:   RBC 3.47 (*)    Hemoglobin 10.3 (*)    HCT 32.9 (*)    MCHC 31.2 (*)    RDW 20.7 (*)    All other components within normal limits  DIFFERENTIAL - Abnormal; Notable for the following components:   Lymphs Abs 0.6 (*)    All other components within normal limits  MRSA PCR SCREENING  CBC WITH DIFFERENTIAL/PLATELET   ____________________________________________  EKG  EKG is not currently available to read ____________________________________________  RADIOLOGY chest x-ray shows congestive heart failure  ____________________________________________   PROCEDURES  Procedure(s) performed:  Procedures  Critical Care performed:   ____________________________________________   INITIAL IMPRESSION / ASSESSMENT AND PLAN / ED COURSE  As part of my medical decision making, I reviewed the following data within the electronic MEDICAL RECORD NUMBER chest x-ray looks like congestive heart failure pulmonary edema possibly with some COPD.      ____________________________________________   FINAL CLINICAL IMPRESSION(S) / ED DIAGNOSES  Final diagnoses:  COPD exacerbation (HCC)  Congestive heart failure, unspecified HF chronicity, unspecified heart failure type (HCC)  Hypoxia     ED Discharge Orders    None       Note:  This document was prepared using Dragon voice recognition software and may include unintentional dictation errors.    Arnaldo NatalMalinda, Ainsley Sanguinetti F, MD 07/07/17 [id number]

## 2017-07-07 NOTE — ED Notes (Signed)
Patient failed "Nasal Cannula trial" x 2. Patient maintained 88-90% on 6LNC. Bipap reapplied. [facility]

## 2017-07-07 NOTE — Progress Notes (Signed)
Transported pt to ICU 2 on Bipap without incident. ICU RT given report.

## 2017-07-07 NOTE — ED Notes (Signed)
RT at bedside to try pt off Bipap

## 2017-07-07 NOTE — ED Triage Notes (Signed)
Patient arrives to [facility] ED via [facility] with c/o ShOB and CP 1 hour PTA. Patient was at dialysis and completed his entire treatment. EMS administered 125 solumedrol, and 5 albuterol enroute

## 2017-07-07 NOTE — H&P (Signed)
[facility] - [place] at [facility]   PATIENT NAME: Kenneth [name]    MR#:  [id number]  DATE OF BIRTH:  02-16-1928  DATE OF ADMISSION:  07/07/2017  PRIMARY CARE PHYSICIAN: Barbette Reichmann, MD   REQUESTING/REFERRING PHYSICIAN: Dr. Juliette Alcide  CHIEF COMPLAINT:  Increasing shortness of breath after dialysis today.  HISTORY OF PRESENT ILLNESS:  Kenneth [name]  is a 83 y.o. male with a known history of sepsis, coronary artery disease status post CABG, hypertension, COPD on chronic 2 L nasal cannula oxygen, recurrent pleural effusion from congestive heart failure with recurrent thoracentesis last thoracentesis done was May 30, 2017 950 cc of fluid was removed comes to the emergency room after dialysis when he started having increasing shortness of breath. His sats were in the 80s.  Chest x-ray showed bilateral pulmonary edema with bilateral pleural effusion right more than left.  Patient was placed on BiPAP.  During my evaluation he is 94% on 5 L nasal cannula oxygen.  He is able to complete a full sentence.  I have ordered Lasix 120 mg IV x1 According to the wife patient has been noncompliant with his diet.  Patient is being admitted with acute hypoxic respiratory failure secondary to acute on chronic diastolic congestive heart failure/pulmonary edema and bilateral pleural effusion right more than left.  PAST MEDICAL HISTORY:   Past Medical History:  Diagnosis Date  . Anemia   . Anxiety   . Arthritis   . B12 deficiency   . Coronary artery disease    a. 2vCABG LIMA-LAD, SVG-OM1 in 1999; b. Lexiscan myoview (5/10) showed EF 45%, HK apex & lateral wall, fixed perfusion defect involving apex, inferoapex, & inferolateral wall, also some inferoapical ischemia; LHC (5/10) showed patent LIMA-LAD and totally occluded SVG-OM1, 99% pLAD, 40% ostial CFX, 40% mCFX, 30% OM1, 20% pRCA  . Diabetes mellitus   . ESRD on hemodialysis (HCC)    a. once weekly as of 08/2015.  Currently 3xweek since 09/11/15.  Marland Kitchen GERD (gastroesophageal reflux disease)   . History of MRI of chest 11/10   a. Cardiac; EF 48%, mid anterolateral and inferolateral walls, apical lateral wall, apical septal wall, and true apex were all think and akinetic; 51-75% thickness subendocardial delayed enhancement in mid anterolateral and inferolateral walls, full thickness enhancement in apical laterla and apical septal walls and true apex  . Hyperlipidemia   . Hypertension   . Ischemic cardiomyopathy    a. likely ICM, EF 55-60%, RWMA could not be excluded, nl PASP, nl CVP  . Nephrolithiasis   . Obesity   . Occasional tremors   . OSA (obstructive sleep apnea)    Moderate CPAP  . Shortness of breath dyspnea   . Spinal stenosis     PAST SURGICAL HISTOIRY:   Past Surgical History:  Procedure Laterality Date  . A/V Shuntogram/Fistulagram Left 03/22/2016   Performed by Renford Dills, MD at [facility] INVASIVE CV LAB  . BACK SURGERY     Multiple, with spinal stenosis  . BASCILIC VEIN TRANSPOSITION ( STAGE 1 ) Left 12/11/2015   Performed by Renford Dills, MD at [facility] ORS  . CARDIAC CATHETERIZATION    . CATARACT EXTRACTION    . CORONARY ARTERY BYPASS GRAFT  1999   In [place] with LIMA-LAD and SVG-OM1  . Dialysis/Perma Catheter Insertion N/A 09/01/2015   Performed by Renford Dills, MD at [facility] INVASIVE CV LAB  . Dialysis/Perma Catheter Removal Left 02/25/2016   Performed by Annice Needy, MD  at [facility] INVASIVE CV LAB  . EYE SURGERY Bilateral    Cataract extraction with IOL  . INSERTION OF DIALYSIS CATHETER Right    Dr. Wyn Quakerew, [facility]  . JOINT REPLACEMENT Left    Total Knee Replacement  . TOTAL KNEE ARTHROPLASTY  5/10    SOCIAL HISTORY:   Social History   Tobacco Use  . Smoking status: Former Smoker    Types: Cigarettes  . Smokeless tobacco: Never Used  Substance Use Topics  . Alcohol use: No    FAMILY HISTORY:   Family History  Problem Relation Age of Onset  . Kidney failure  Mother   . Heart failure Father     DRUG ALLERGIES:   Allergies  Allergen Reactions  . Bee Venom   . Beef-Derived Products Hives  . Iodine Hives  . Ivp Dye [Iodinated Diagnostic Agents] Hives    REVIEW OF SYSTEMS:  Review of Systems  Constitutional: Negative for chills, fever and weight loss.  HENT: Negative for ear discharge, ear pain and nosebleeds.   Eyes: Negative for blurred vision, pain and discharge.  Respiratory: Positive for shortness of breath and wheezing. Negative for sputum production and stridor.   Cardiovascular: Negative for palpitations, orthopnea and PND.  Gastrointestinal: Negative for abdominal pain, diarrhea, nausea and vomiting.  Genitourinary: Negative for frequency and urgency.  Musculoskeletal: Negative for back pain and joint pain.  Neurological: Positive for weakness. Negative for sensory change, speech change and focal weakness.  Psychiatric/Behavioral: Negative for depression and hallucinations. The patient is not nervous/anxious.      MEDICATIONS AT HOME:   Prior to Admission medications   Medication Sig Start Date End Date Taking? Authorizing Provider  aspirin EC 81 MG tablet Take 81 mg by mouth daily.    [provider]  B Complex-C-Folic Acid (VOL-CARE RX) 1 MG TABS Take 1 tablet by mouth every evening.  12/15/16   [provider]  calcium acetate (PHOSLO) 667 MG capsule Take 2,001 mg by mouth 3 (three) times daily.  06/17/16   [provider]  cinacalcet (SENSIPAR) 30 MG tablet Take 1 tablet by mouth daily. 12/17/15   [provider]  ferrous sulfate 325 (65 FE) MG tablet Take 325 mg by mouth daily.    [provider]  Lancets (FREESTYLE) lancets Use 3 times a day to check blood sugar. 04/21/17   Altamese DillingVachhani, Vaibhavkumar, MD  lidocaine-prilocaine (EMLA) cream Apply 2.5 application topically as needed.    [provider]  lovastatin (MEVACOR) 20 MG tablet Take 20 mg by mouth at bedtime.     [provider]  megestrol (MEGACE) 20 MG tablet Take 20 mg by mouth daily.    [provider]  metoprolol succinate (TOPROL XL) 25 MG 24 hr tablet Take 0.5 tablets (12.5 mg total) by mouth daily. 04/22/17   Altamese DillingVachhani, Vaibhavkumar, MD  nitroGLYCERIN (NITROSTAT) 0.4 MG SL tablet Place 1 tablet (0.4 mg total) under the tongue every 5 (five) minutes as needed for chest pain. 08/17/16 04/19/18  Antonieta IbaGollan, Timothy J, MD  Nutritional Supplements (FEEDING SUPPLEMENT, NEPRO CARB STEADY,) LIQD Take 237 mLs by mouth daily. 04/21/17   Altamese DillingVachhani, Vaibhavkumar, MD  sertraline (ZOLOFT) 25 MG tablet Take 1 tablet by mouth daily.  12/07/16   [provider]  SPIRIVA HANDIHALER 18 MCG inhalation capsule Place 1 capsule into inhaler and inhale daily. 05/11/16   [provider]  tamsulosin (FLOMAX) 0.4 MG CAPS capsule Take 1 capsule (0.4 mg total) by mouth daily. 09/04/15  Ramonita LabGouru, Aruna, MD  traZODone (DESYREL) 50 MG tablet Take 1 tablet by mouth at bedtime. 05/17/16   [provider]      VITAL SIGNS:  Blood pressure 126/62, pulse (!) 116, temperature (!) 97.4 F (36.3 C), temperature source Oral, resp. rate (!) 24, height 5\' 10"  (1.778 m), weight 72.6 kg (160 lb), SpO2 90 %.  PHYSICAL EXAMINATION:  GENERAL:  83 y.o.-year-old patient lying in the bed with no acute distress.  EYES: Pupils equal, round, reactive to light and accommodation. No scleral icterus. Extraocular muscles intact.  HEENT: Head atraumatic, normocephalic. Oropharynx and nasopharynx clear.  NECK:  Supple, no jugular venous distention. No thyroid enlargement, no tenderness.  LUNGS: decreased breath sounds bilaterally, no wheezing, rales,rhonchi or crepitation. No use of accessory muscles of respiration.  CARDIOVASCULAR: S1, S2 normal. No murmurs, rubs, or gallops.  ABDOMEN: Soft, nontender, nondistended. Bowel sounds present. No organomegaly or mass.  EXTREMITIES: No pedal edema, cyanosis, or clubbing.   NEUROLOGIC: Cranial nerves II through XII are intact. Muscle strength 5/5 in all extremities. Sensation intact. Gait not checked.  PSYCHIATRIC:  patient is alert and oriented x 3. Hard on hearing SKIN: No obvious rash, lesion, or ulcer.   LABORATORY PANEL:   CBC No results for input(s): WBC, HGB, HCT, PLT in the last 168 hours. ------------------------------------------------------------------------------------------------------------------  Chemistries  Recent Labs  Lab 07/07/17 1617  NA 137  K 3.4*  CL 94*  CO2 32  GLUCOSE 155*  BUN 11  CREATININE 1.81*  CALCIUM 8.5*  AST 21  ALT 7*  ALKPHOS 76  BILITOT 0.7   ------------------------------------------------------------------------------------------------------------------  Cardiac Enzymes Recent Labs  Lab 07/07/17 1617  TROPONINI 0.06*   ------------------------------------------------------------------------------------------------------------------  RADIOLOGY:  Dg Chest Portable 1 View  Result Date: 07/07/2017 CLINICAL DATA:  Short of breath EXAM: PORTABLE CHEST 1 VIEW COMPARISON:  05/30/2017, 04/19/2017, CT chest 12/07/2015 FINDINGS: Post sternotomy changes. Moderate left greater than right pleural effusions, likely layering and contributing to hazy opacity in the thoraces. Cardiomegaly with vascular congestion and suspected mild pulmonary edema. Bibasilar consolidation. IMPRESSION: Moderate left greater than right pleural effusions with bibasilar consolidations. Cardiomegaly with vascular congestion and suspected pulmonary edema. Electronically Signed   By: Jasmine PangKim  Fujinaga M.D.   On: 07/07/2017 16:46    EKG:    IMPRESSION AND PLAN:   Arlington CalixHollan Schoof  is a 83 y.o. male with a known history of sepsis, coronary artery disease status post CABG, hypertension, COPD on chronic 2 L nasal cannula oxygen, recurrent pleural effusion from congestive heart failure with recurrent thoracentesis last thoracentesis done was  May 30, 2017 950 cc of fluid was removed comes to the emergency room after dialysis when he started having increasing shortness of breath.  1.  Acute on chronic hypoxic respiratory failure secondary to acute on chronic diastolic congestive heart failure/pulmonary edema -With increasing shortness of breath with low saturations after completing 3 hours of dialysis. -X-ray showed bilateral pulmonary edema with bilateral pleural effusion right more than left along with elevated BNP of 3000 consistent with congestive heart failure -Lasix 120 mg x1 -.  Patient is currently off BiPAP.  He is able to complete full sentence -We will continue to monitor -PRN breathing treatment -Hemodialysis tomorrow for ultrafiltration---with Dr. Thedore MinsSingh -We will not repeat echo since it was done this year EF of 55%  2.  Bilateral pleural effusion with recurrent thoracentesis -Since last thoracentesis was on 05/30/2017 with 950 cc removed -Prior to that that he had in August 2018 1.2 L fluid  was removed from the right side -Continue nasal cannula oxygen for now --Consider doing chest x-ray after hemodialysis with ultrafiltration tomorrow--- and see if patient requires thoracentesis  3.  End-stage renal disease on hemodialysis -Completed a full 3-hour cycle.  I am not sure if ultrafiltration was done.  Spoke with Dr. Thedore Mins  4.  Hypertension continue home meds  5.  Diabetes sliding scale insulin and home meds  6.  DVT prophylaxis subcu heparin  Was discussed with patient patient's wife present in the emergency room.  CODE STATUS addressed patient is a DNR his wife   All the records are reviewed and case discussed with ED provider. Management plans discussed with the patient, family and they are in agreement.  CODE STATUS: DNR  TOTAL TIME TAKING CARE OF THIS PATIENT: 50 minutes.    Enedina Finner M.D on 07/07/2017 at 5:46 PM  Between 7am to 6pm - Pager - [phone]  After 6pm go to [email or web address] -  password [email or web address] [facility]  [facility]  Office  [phone]  CC: Primary care physician; Barbette Reichmann, MD

## 2017-07-08 ENCOUNTER — Other Ambulatory Visit: Payer: Self-pay

## 2017-07-08 ENCOUNTER — Encounter: Payer: Self-pay | Admitting: Pulmonary Disease

## 2017-07-08 DIAGNOSIS — J9601 Acute respiratory failure with hypoxia: Secondary | ICD-10-CM

## 2017-07-08 LAB — BASIC METABOLIC PANEL
ANION GAP: 8 (ref 5–15)
BUN: 20 mg/dL (ref 6–20)
CHLORIDE: 96 mmol/L — AB (ref 101–111)
CO2: 34 mmol/L — ABNORMAL HIGH (ref 22–32)
CREATININE: 2.59 mg/dL — AB (ref 0.61–1.24)
Calcium: 8.1 mg/dL — ABNORMAL LOW (ref 8.9–10.3)
GFR calc non Af Amer: 21 mL/min — ABNORMAL LOW (ref >60)
GFR, EST AFRICAN AMERICAN: 24 mL/min — AB (ref >60)
Glucose, Bld: 186 mg/dL — ABNORMAL HIGH (ref 65–99)
Potassium: 5.1 mmol/L (ref 3.5–5.1)
SODIUM: 138 mmol/L (ref 135–145)

## 2017-07-08 LAB — CBC
HEMATOCRIT: 31.9 % — AB (ref 40.0–52.0)
HEMOGLOBIN: 9.6 g/dL — AB (ref 13.0–18.0)
MCH: 29 pg (ref 26.0–34.0)
MCHC: 30.2 g/dL — ABNORMAL LOW (ref 32.0–36.0)
MCV: 96 fL (ref 80.0–100.0)
PLATELETS: 331 10*3/uL (ref 150–440)
RBC: 3.32 MIL/uL — AB (ref 4.40–5.90)
RDW: 20.5 % — ABNORMAL HIGH (ref 11.5–14.5)
WBC: 4.7 10*3/uL (ref 3.8–10.6)

## 2017-07-08 LAB — GLUCOSE, CAPILLARY
GLUCOSE-CAPILLARY: 162 mg/dL — AB (ref 65–99)
GLUCOSE-CAPILLARY: 94 mg/dL (ref 65–99)
Glucose-Capillary: 102 mg/dL — ABNORMAL HIGH (ref 65–99)
Glucose-Capillary: 132 mg/dL — ABNORMAL HIGH (ref 65–99)

## 2017-07-08 LAB — PHOSPHORUS: Phosphorus: 4.9 mg/dL — ABNORMAL HIGH (ref 2.5–4.6)

## 2017-07-08 LAB — MAGNESIUM: Magnesium: 2.1 mg/dL (ref 1.7–2.4)

## 2017-07-08 LAB — MRSA PCR SCREENING: MRSA by PCR: NEGATIVE

## 2017-07-08 MED ORDER — ORAL CARE MOUTH RINSE
15.0000 mL | Freq: Two times a day (BID) | OROMUCOSAL | Status: DC
  Administered 2017-07-09 – 2017-07-14 (×7): 15 mL via OROMUCOSAL

## 2017-07-08 MED ORDER — IPRATROPIUM-ALBUTEROL 0.5-2.5 (3) MG/3ML IN SOLN
3.0000 mL | Freq: Four times a day (QID) | RESPIRATORY_TRACT | Status: DC
  Administered 2017-07-08 – 2017-07-12 (×15): 3 mL via RESPIRATORY_TRACT
  Filled 2017-07-08 (×18): qty 3

## 2017-07-08 MED ORDER — ALBUMIN HUMAN 5 % IV SOLN
25.0000 g | Freq: Once | INTRAVENOUS | Status: DC
  Filled 2017-07-08: qty 500

## 2017-07-08 MED ORDER — ALBUMIN HUMAN 25 % IV SOLN
25.0000 g | Freq: Once | INTRAVENOUS | Status: AC
  Administered 2017-07-08: 25 g via INTRAVENOUS
  Filled 2017-07-08: qty 100

## 2017-07-08 MED ORDER — ALBUTEROL SULFATE (2.5 MG/3ML) 0.083% IN NEBU: 2.5000 mg | INHALATION_SOLUTION | RESPIRATORY_TRACT | Status: DC | PRN

## 2017-07-08 MED ORDER — CHLORHEXIDINE GLUCONATE 0.12 % MT SOLN
15.0000 mL | Freq: Two times a day (BID) | OROMUCOSAL | Status: DC
  Administered 2017-07-08 (×2): 15 mL via OROMUCOSAL
  Filled 2017-07-08: qty 15

## 2017-07-08 MED ORDER — MIDODRINE HCL 5 MG PO TABS
5.0000 mg | ORAL_TABLET | Freq: Once | ORAL | Status: DC
  Filled 2017-07-08: qty 1

## 2017-07-08 MED ORDER — FUROSEMIDE 10 MG/ML IJ SOLN
20.0000 mg | Freq: Two times a day (BID) | INTRAMUSCULAR | Status: DC
  Administered 2017-07-08 – 2017-07-13 (×7): 20 mg via INTRAVENOUS
  Filled 2017-07-08: qty 2
  Filled 2017-07-08 (×2): qty 4
  Filled 2017-07-08 (×2): qty 2
  Filled 2017-07-08 (×2): qty 4
  Filled 2017-07-08: qty 2
  Filled 2017-07-08 (×2): qty 4
  Filled 2017-07-08: qty 2

## 2017-07-08 MED ORDER — MIDODRINE HCL 5 MG PO TABS
5.0000 mg | ORAL_TABLET | Freq: Two times a day (BID) | ORAL | Status: DC
  Administered 2017-07-08 – 2017-07-14 (×14): 5 mg via ORAL
  Filled 2017-07-08 (×14): qty 1

## 2017-07-08 NOTE — Progress Notes (Signed)
[facility] - [place] at [facility]   PATIENT NAME: Arlington CalixHollan Steffenhagen    MR#:  [id number]  DATE OF BIRTH:  [birth year]  SUBJECTIVE:  CHIEF COMPLAINT:   Chief Complaint  Patient presents with  . Shortness of Breath  . Chest Pain   - Admitted with hypoxia and difficulty breathing. Chest x-ray with bilateral pleural effusions again. -Remains on BiPAP FiO2 reduced to 30%  REVIEW OF SYSTEMS:  Review of Systems  Constitutional: Positive for malaise/fatigue. Negative for chills and fever.  HENT: Negative for congestion, ear discharge, hearing loss and nosebleeds.   Eyes: Negative for blurred vision and double vision.  Respiratory: Positive for cough and shortness of breath. Negative for wheezing.   Cardiovascular: Positive for leg swelling. Negative for chest pain and palpitations.  Gastrointestinal: Negative for abdominal pain, constipation, diarrhea, nausea and vomiting.  Genitourinary: Negative for dysuria.  Musculoskeletal: Negative for myalgias.  Neurological: Negative for dizziness, sensory change, speech change, focal weakness, seizures and headaches.  Psychiatric/Behavioral: Negative for depression.    DRUG ALLERGIES:   Allergies  Allergen Reactions  . Bee Venom   . Beef-Derived Products Hives  . Iodine Hives  . Ivp Dye [Iodinated Diagnostic Agents] Hives    VITALS:  Blood pressure 103/61, pulse 99, temperature 98.1 F (36.7 C), temperature source Axillary, resp. rate (!) 24, height 5\' 6"  (1.676 m), weight 72.8 kg (160 lb 7.9 oz), SpO2 100 %.  PHYSICAL EXAMINATION:  Physical Exam  GENERAL:  83 y.o.-year-old patient lying in the bed with no acute distress.  EYES: Pupils equal, round, reactive to light and accommodation. No scleral icterus. Extraocular muscles intact.  HEENT: Head atraumatic, normocephalic. Oropharynx and nasopharynx clear. On Bipap NECK:  Supple, no jugular venous distention. No thyroid enlargement, no tenderness.  LUNGS: Normal  breath sounds bilaterally, no wheezing, rales,rhonchi or crepitation. No use of accessory muscles of respiration. Decreased bibasilar breath sounds CARDIOVASCULAR: S1, S2 normal. No  rubs, or gallops. 2/6 systolic murmur present  ABDOMEN: Soft, nontender, nondistended. Bowel sounds present. No organomegaly or mass.  EXTREMITIES: No  cyanosis, or clubbing. 1+ pedal edema noted NEUROLOGIC: Cranial nerves II through XII are intact. Muscle strength 5/5 in all extremities. Sensation intact. Gait not checked. Global weakness noted.  PSYCHIATRIC: The patient is alert and oriented to self  SKIN: No obvious rash, lesion, or ulcer.    LABORATORY PANEL:   CBC Recent Labs  Lab 07/08/17 0446  WBC 4.7  HGB 9.6*  HCT 31.9*  PLT 331   ------------------------------------------------------------------------------------------------------------------  Chemistries  Recent Labs  Lab 07/07/17 1617 07/08/17 0446  NA 137 138  K 3.4* 5.1  CL 94* 96*  CO2 32 34*  GLUCOSE 155* 186*  BUN 11 20  CREATININE 1.81* 2.59*  CALCIUM 8.5* 8.1*  MG  --  2.1  AST 21  --   ALT 7*  --   ALKPHOS 76  --   BILITOT 0.7  --    ------------------------------------------------------------------------------------------------------------------  Cardiac Enzymes Recent Labs  Lab 07/07/17 1617  TROPONINI 0.06*   ------------------------------------------------------------------------------------------------------------------  RADIOLOGY:  Dg Chest Portable 1 View  Result Date: 07/07/2017 CLINICAL DATA:  Short of breath EXAM: PORTABLE CHEST 1 VIEW COMPARISON:  05/30/2017, 04/19/2017, CT chest 12/07/2015 FINDINGS: Post sternotomy changes. Moderate left greater than right pleural effusions, likely layering and contributing to hazy opacity in the thoraces. Cardiomegaly with vascular congestion and suspected mild pulmonary edema. Bibasilar consolidation. IMPRESSION: Moderate left greater than right pleural effusions  with bibasilar consolidations. Cardiomegaly with  vascular congestion and suspected pulmonary edema. Electronically Signed   By: Jasmine PangKim  Fujinaga M.D.   On: 07/07/2017 16:46    EKG:   Orders placed or performed during the hospital encounter of 07/07/17  . ED EKG  . ED EKG    ASSESSMENT AND PLAN:   Arlington CalixHollan Dicesare  is a 83 y.o. male with a known history of sepsis, coronary artery disease status post CABG, hypertension, COPD on chronic 2 L nasal cannula oxygen, recurrent pleural effusion from congestive heart failure with recurrent thoracentesis last thoracentesis done was May 30, 2017 950 cc of fluid was removed comes to the emergency room after dialysis when he started having increasing shortness of breath.  1.  Acute on chronic hypoxic respiratory failure secondary to acute on chronic diastolic congestive heart failure/pulmonary edema -remained hypoxic even after dialysis -X-ray showed bilateral pulmonary edema with bilateral pleural effusion left > right- - not responded well to lasix as anuric at baseline - remains on Bipap, fio2 30%. Discussed with IR for thoracentesis. -PRN breathing treatment -Hemodialysis again today if BP is improved -Last echo almost 2 years ago with diastolic dysfunction and EF of 55%. With cardiomegaly noted on chest x-ray, recommend repeating echocardiogram due to recurrent effusions  2.  Bilateral pleural effusion with recurrent thoracentesis -Since last thoracentesis was on 05/30/2017 with 950 cc removed -Has had several thoracentesis done since June 2018 alternating both on right and the left sides. -Discussed with IR again that he will need thoracentesis this admission  3.  End-stage renal disease on hemodialysis -Had progressively worsening CK D, has been on dialysis for almost 2 years now. -Nephrology consulted.  4.  Hypotension-started on midodrine twice a day  5.  Diabetes sliding scale insulin and home meds  6.  DVT prophylaxis subcu  heparin  Physical therapy once more stable. At baseline has a walker but only ambulate a few steps due to chronic dyspnea    All the records are reviewed and case discussed with Care Management/Social Workerr. Management plans discussed with the patient, family and they are in agreement.  CODE STATUS: DNR  TOTAL TIME TAKING CARE OF THIS PATIENT: 37 minutes.   POSSIBLE D/C IN 1-2 DAYS, DEPENDING ON CLINICAL CONDITION.   Enid BaasKALISETTI,Virlan Kempker M.D on 07/08/2017 at 8:57 AM  Between 7am to 6pm - Pager - [phone]  After 6pm go to [email or web address] - Social research officer, governmentpassword EPAS [email or web address]  [facility] [place] [facility]  Office  [phone]  CC: Primary care physician; Barbette ReichmannHande, Vishwanath, MD

## 2017-07-08 NOTE — Progress Notes (Signed)
Post hd vitals 

## 2017-07-08 NOTE — Progress Notes (Signed)
This note also relates to the following rows which could not be included: Pulse Rate - Cannot attach notes to unvalidated device data Resp - Cannot attach notes to unvalidated device data BP - Cannot attach notes to unvalidated device data SpO2 - Cannot attach notes to unvalidated device data  HD STARTED  

## 2017-07-08 NOTE — Consult Note (Signed)
PULMONARY / CRITICAL CARE MEDICINE   Name: Kenneth [name] MRN: [id number] DOB: 05-29-1928    ADMISSION DATE:  07/07/2017   CONSULTATION DATE:  07/08/2017  REFERRING MD: Dr. Allena Katz    Reason: Acute respiratory failure  CHIEF COMPLAINT: Worsening dyspnea  HISTORY OF PRESENT ILLNESS:   This is an 83 year old male with a history of end-stage renal disease hemodialysis who presented to the ED with acute shortness of breath after dialysis.  Patient is on home O2 at 2 L nasal cannula.  At the ED his SPO2 was in the 80s.  His chest x-ray showed acute pulmonary edema and bilateral pleural effusions.  He was placed on BiPAP and is being admitted to the ICU for further management. Patient has a history of recurrent pleural effusions.  Last thoracentesis was May 30, 2017.  PAST MEDICAL HISTORY :  He  has a past medical history of Anemia, Anxiety, Arthritis, B12 deficiency, Coronary artery disease, Diabetes mellitus, ESRD on hemodialysis (HCC), GERD (gastroesophageal reflux disease), History of MRI of chest (11/10), Hyperlipidemia, Hypertension, Ischemic cardiomyopathy, Nephrolithiasis, Obesity, Occasional tremors, OSA (obstructive sleep apnea), Shortness of breath dyspnea, and Spinal stenosis.  PAST SURGICAL HISTORY: He  has a past surgical history that includes Back surgery; Cataract extraction; Coronary artery bypass graft (1999); Total knee arthroplasty (5/10); Cardiac catheterization (N/A, 09/01/2015); Eye surgery (Bilateral); Cardiac catheterization; Joint replacement (Left); Insertion of dialysis catheter (Right); Bascilic vein transposition (Left, 12/11/2015); Cardiac catheterization (Left, 02/25/2016); and Cardiac catheterization (Left, 03/22/2016).  Allergies  Allergen Reactions  . Bee Venom   . Beef-Derived Products Hives  . Iodine Hives  . Ivp Dye [Iodinated Diagnostic Agents] Hives    No current facility-administered medications on file prior to encounter.    Current Outpatient  Medications on File Prior to Encounter  Medication Sig  . aspirin EC 81 MG tablet Take 81 mg by mouth daily.  . B Complex-C-Folic Acid (VOL-CARE RX) 1 MG TABS Take 1 tablet by mouth every evening.   . calcium acetate (PHOSLO) 667 MG capsule Take 2,001 mg by mouth 3 (three) times daily.   . cinacalcet (SENSIPAR) 30 MG tablet Take 1 tablet by mouth daily.  . ferrous sulfate 325 (65 FE) MG tablet Take 325 mg by mouth daily.  . Lancets (FREESTYLE) lancets Use 3 times a day to check blood sugar.  . lidocaine-prilocaine (EMLA) cream Apply 2.5 application topically as needed.  . lovastatin (MEVACOR) 20 MG tablet Take 20 mg by mouth at bedtime.  . megestrol (MEGACE) 20 MG tablet Take 20 mg by mouth daily.  . metoprolol succinate (TOPROL XL) 25 MG 24 hr tablet Take 0.5 tablets (12.5 mg total) by mouth daily.  . nitroGLYCERIN (NITROSTAT) 0.4 MG SL tablet Place 1 tablet (0.4 mg total) under the tongue every 5 (five) minutes as needed for chest pain.  . Nutritional Supplements (FEEDING SUPPLEMENT, NEPRO CARB STEADY,) LIQD Take 237 mLs by mouth daily.  . sertraline (ZOLOFT) 25 MG tablet Take 1 tablet by mouth daily.   Marland Kitchen SPIRIVA HANDIHALER 18 MCG inhalation capsule Place 1 capsule into inhaler and inhale daily.  . tamsulosin (FLOMAX) 0.4 MG CAPS capsule Take 1 capsule (0.4 mg total) by mouth daily.  Marland Kitchen torsemide (DEMADEX) 100 MG tablet Take 100 mg daily by mouth.  . traZODone (DESYREL) 50 MG tablet Take 1 tablet by mouth at bedtime.    FAMILY HISTORY:  His indicated that his mother is deceased. He indicated that his father is deceased.   SOCIAL HISTORY: He  reports that he has quit smoking. His smoking use included cigarettes. he has never used smokeless tobacco. He reports that he does not drink alcohol or use drugs.  REVIEW OF SYSTEMS:   Unable to obtain as patient is on  continuous BiPAP  SUBJECTIVE:   VITAL SIGNS: BP 103/61   Pulse 95   Temp 98.1 F (36.7 C) (Axillary)   Resp 15   Ht 5'  6" (1.676 m)   Wt 72.8 kg (160 lb 7.9 oz)   SpO2 100%   BMI 25.90 kg/m   HEMODYNAMICS:    VENTILATOR SETTINGS: FiO2 (%):  [40 %] 40 %  INTAKE / OUTPUT: No intake/output data recorded.  PHYSICAL EXAMINATION: General: Moderate respiratory distress Neuro: Alert and oriented x3, moves all extremities HEENT: PERRLA, trachea midline, neck is supple, mild JVD Cardiovascular: RRR, S1-S2, no murmur regurg or gallop, +2 pulses, no edema Lungs: Increased work of breathing, decreased breath sounds bilaterally, no wheezes or rhonchi Abdomen: Nondistended, normal bowel sounds in all 4 quadrants Musculoskeletal: No joint deformities, positive range of motion in upper and lower extremities, unable to assess gait Skin: Warm and dry with no rash  LABS:  BMET Recent Labs  Lab 07/07/17 1617 07/08/17 0446  NA 137 138  K 3.4* 5.1  CL 94* 96*  CO2 32 34*  BUN 11 20  CREATININE 1.81* 2.59*  GLUCOSE 155* 186*    Electrolytes Recent Labs  Lab 07/07/17 1617 07/08/17 0446  CALCIUM 8.5* 8.1*  MG  --  2.1  PHOS  --  4.9*    CBC Recent Labs  Lab 07/07/17 1750 07/08/17 0446  WBC 6.5 4.7  HGB 10.3* 9.6*  HCT 32.9* 31.9*  PLT 298 331    Coag's No results for input(s): APTT, INR in the last 168 hours.  Sepsis Markers No results for input(s): LATICACIDVEN, PROCALCITON, O2SATVEN in the last 168 hours.  ABG No results for input(s): PHART, PCO2ART, PO2ART in the last 168 hours.  Liver Enzymes Recent Labs  Lab 07/07/17 1617  AST 21  ALT 7*  ALKPHOS 76  BILITOT 0.7  ALBUMIN 3.1*    Cardiac Enzymes Recent Labs  Lab 07/07/17 1617  TROPONINI 0.06*    Glucose Recent Labs  Lab 07/07/17 2324  GLUCAP 193*    Imaging Dg Chest Portable 1 View  Result Date: 07/07/2017 CLINICAL DATA:  Short of breath EXAM: PORTABLE CHEST 1 VIEW COMPARISON:  05/30/2017, 04/19/2017, CT chest 12/07/2015 FINDINGS: Post sternotomy changes. Moderate left greater than right pleural effusions,  likely layering and contributing to hazy opacity in the thoraces. Cardiomegaly with vascular congestion and suspected mild pulmonary edema. Bibasilar consolidation. IMPRESSION: Moderate left greater than right pleural effusions with bibasilar consolidations. Cardiomegaly with vascular congestion and suspected pulmonary edema. Electronically Signed   By: Jasmine PangKim  Fujinaga M.D.   On: 07/07/2017 16:46    SIGNIFICANT EVENTS: 07/07/17: Admitted with acute respiratory failure  LINES/TUBES: Peripheral IVs  DISCUSSION: 83 year old male presenting with acute hypoxemic respiratory failure secondary to acute pulmonary edema, CHF exacerbation, and bilateral pleural effusions  ASSESSMENT Acute on chronic hypoxic respiratory failure Acute pulmonary edema Acute CHF exacerbation Bilateral pleural effusions End-stage renal disease on hemodialysis History of hypertension, type 2 diabetes and obstructive sleep apnea on home O2  PLAN Continuous BiPAP and titrate to nasal cannula as tolerated Hemodialysis per nephrology IV diuresis per nephrology Nebulized bronchodilators and inhaled steroids Chest x-ray and ABG as needed Hemodynamic monitoring per ICU protocol GI and DVT prophylaxis Rest of the treatment  plan unchanged  Patient is a DNR  FAMILY  - Updates: No family at bedside with update when available  - Inter-disciplinary family meet or Palliative Care meeting due by:  day 7   Magdalene [facility] ANP-BC Pulmonary and Critical Care Medicine [facility] Pager [phone] or [phone] 07/08/2017, 7:15 AM   Pulmonary/critical care attending  I have personally seen and examined [name], reviewed revise and confirm nurse practitioner's note I independently performed history, physical, reviewed laboratory and imaging studies. In short patient with underlying history of end-stage renal failure, status post hemodialysis, with progressive shortness of breath requiring CPAP and  transferred to the intensive care unit. Chest x-ray consistent with volume overload pattern. Pending hemodialysis today  Tora KindredJohn Shondell Fabel, D.O.

## 2017-07-08 NOTE — Progress Notes (Signed)
Hd end 

## 2017-07-08 NOTE — Progress Notes (Signed)
[facility] [place], [place] 07/08/17  Subjective:   Patient known to our practice from outpatient dialysis.  He presented to the hospital emergency room via EMS for shortness of breath and chest pain.  He completed his entire dialysis treatment yesterday. He has difficulty hearing and is able to provide only limited amount of information.  His daughter is at bedside.   His chest x-ray shows pulmonary vascular congestion, cardiomegaly and bilateral pleural effusions.  They state that he recently had thoracentesis in October.  950 cc of fluid was removed from the right pleural space  This morning, he states that he feels a little better compared to last night.  His blood pressure is low normal at 90 systolic.   Objective:  Vital signs in last 24 hours:  Temp:  [97.4 F (36.3 C)-98.2 F (36.8 C)] 98.1 F (36.7 C) (11/17 0200) Pulse Rate:  [90-116] 99 (11/17 0834) Resp:  [12-26] 24 (11/17 0834) BP: (93-136)/(51-90) 103/61 (11/17 0600) SpO2:  [81 %-100 %] 100 % (11/17 0834) FiO2 (%):  [40 %] 40 % (11/17 0832) Weight:  [72.6 kg (160 lb)-72.8 kg (160 lb 7.9 oz)] 72.8 kg (160 lb 7.9 oz) (11/16 2330)  Weight change:  Filed Weights   07/07/17 1622 07/07/17 2330  Weight: 72.6 kg (160 lb) 72.8 kg (160 lb 7.9 oz)    Intake/Output:   No intake or output data in the 24 hours ending 07/08/17 0855   Physical Exam: General:  Elderly, frail gentleman, laying in the bed  HEENT CPAP mask in place  Neck  supple  Pulm/lungs  decreased breath sounds at bases, mild scattered rhonchi, no obvious crackles  CVS/Heart  tachycardic, regular, no rub or gallop  Abdomen:   Soft, nontender, nondistended  Extremities:  Trace to 1+ pitting edema  Neurologic:  Alert, able to follow simple commands, decreased hearing  Skin:  No acute rashes          Basic Metabolic Panel:  Recent Labs  Lab 07/07/17 1617 07/08/17 0446  NA 137 138  K 3.4* 5.1  CL 94* 96*  CO2 32 34*  GLUCOSE  155* 186*  BUN 11 20  CREATININE 1.81* 2.59*  CALCIUM 8.5* 8.1*  MG  --  2.1  PHOS  --  4.9*     CBC: Recent Labs  Lab 07/07/17 1750 07/08/17 0446  WBC 6.5 4.7  NEUTROABS 5.6  --   HGB 10.3* 9.6*  HCT 32.9* 31.9*  MCV 94.9 96.0  PLT 298 331      Lab Results  Component Value Date   HEPBSAG Negative 04/19/2017   HEPBSAB Non Reactive 09/01/2015      Microbiology:  Recent Results (from the past 240 hour(s))  Microscopic Examination     Status: Abnormal   Collection Time: 07/04/17  4:14 PM  Result Value Ref Range Status   WBC, UA 0-5 0 - 5 /hpf Final   RBC, UA >30 (A) 0 - 2 /hpf Final   Epithelial Cells (non renal) 0-10 0 - 10 /hpf Final   Renal Epithel, UA 0-10 None seen /hpf Final   Casts Present (A) None seen /lpf Final   Cast Type Hyaline casts N/A Final   Mucus, UA Present (A) Not Estab. Final   Bacteria, UA Moderate (A) None seen/Few Final  MRSA PCR Screening     Status: None   Collection Time: 07/07/17 11:22 PM  Result Value Ref Range Status   MRSA by PCR NEGATIVE NEGATIVE Final  Comment:        The GeneXpert MRSA Assay (FDA approved for NASAL specimens only), is one component of a comprehensive MRSA colonization surveillance program. It is not intended to diagnose MRSA infection nor to guide or monitor treatment for MRSA infections.     Coagulation Studies: No results for input(s): LABPROT, INR in the last 72 hours.  Urinalysis: No results for input(s): COLORURINE, LABSPEC, PHURINE, GLUCOSEU, HGBUR, BILIRUBINUR, KETONESUR, PROTEINUR, UROBILINOGEN, NITRITE, LEUKOCYTESUR in the last 72 hours.  Invalid input(s): APPERANCEUR    Imaging: Dg Chest Portable 1 View  Result Date: 07/07/2017 CLINICAL DATA:  Short of breath EXAM: PORTABLE CHEST 1 VIEW COMPARISON:  05/30/2017, 04/19/2017, CT chest 12/07/2015 FINDINGS: Post sternotomy changes. Moderate left greater than right pleural effusions, likely layering and contributing to hazy opacity in the  thoraces. Cardiomegaly with vascular congestion and suspected mild pulmonary edema. Bibasilar consolidation. IMPRESSION: Moderate left greater than right pleural effusions with bibasilar consolidations. Cardiomegaly with vascular congestion and suspected pulmonary edema. Electronically Signed   By: Jasmine PangKim  Fujinaga M.D.   On: 07/07/2017 16:46     Medications:   . albumin human     . aspirin EC  81 mg Oral Daily  . calcium acetate  2,001 mg Oral TID  . chlorhexidine  15 mL Mouth Rinse BID  . cinacalcet  30 mg Oral Daily  . feeding supplement (NEPRO CARB STEADY)  237 mL Oral Q24H  . ferrous sulfate  325 mg Oral Daily  . furosemide  20 mg Intravenous Q12H  . heparin  5,000 Units Subcutaneous Q8H  . insulin aspart  0-9 Units Subcutaneous TID WC  . ipratropium-albuterol  3 mL Nebulization Q6H  . mouth rinse  15 mL Mouth Rinse q12n4p  . megestrol  20 mg Oral Daily  . metoprolol succinate  12.5 mg Oral Daily  . midodrine  5 mg Oral Once  . pravastatin  20 mg Oral q1800  . senna  1 tablet Oral BID  . sertraline  25 mg Oral Daily  . tamsulosin  0.4 mg Oral Daily  . tiotropium  1 capsule Inhalation Daily  . torsemide  100 mg Oral Daily  . traZODone  50 mg Oral QHS   acetaminophen **OR** acetaminophen, albuterol, ondansetron **OR** ondansetron (ZOFRAN) IV  Assessment/ Plan:  83 y.o. male with end-stage kidney disease, dialysis,diabetes type 2, coronary disease, CABG, anemia of chronic kidney disease, anxiety disorder, GERD, recurrent pleural effusions s/p thoracentesis .  [facility]. MWF-1. [facility]/Dr Kolluru  1.  ESRD on HD MWF 2.  Acute shortness of breath from pulmonary edema with bilateral pleural effusions.   3.  Anemia of chronic kidney disease 4.  Secondary hyperparathyroidism  Patient likely has shortness of breath from multiple factors including pleural effusions, pulmonary edema, congestive heart failure.  We plan to perform an extra dialysis on him today with  midodrine support for low blood pressure as well as IV albumin.  Ultrafiltration might be limited due to low blood pressure, however we will try.  Patient might ultimately need thoracentesis for pleural effusions.  Most recent thoracentesis was October 9.  950 cc fluid removed from right pleural space at that time. Continue Procrit with his routine dialysis days Monitor phosphorus during hospital stay.  Current level of 4.9.    LOS: 1 Louisa Favaro 11/17/20188:55 AM  [address] [facility] [place], [place] [phone]

## 2017-07-08 NOTE — Progress Notes (Signed)
Post hd assessment 

## 2017-07-08 NOTE — Progress Notes (Signed)
PT Cancellation Note  Patient Details Name: Kenneth [name] T [place] MRN: [id number] DOB: 1928-03-30   Cancelled Treatment:    Reason Eval/Treat Not Completed: Patient's level of consciousness;Medical issues which prohibited therapy.  Pt is not breathing well and has just started HD.  Will ck tomorrow.   Ivar DrapeRuth E Cherine Drumgoole 07/08/2017, 10:12 AM   Samul Dadauth Rogan Ecklund, PT MS Acute Rehab Dept. Number: [facility] [id number] and [facility] [phone]

## 2017-07-08 NOTE — Progress Notes (Signed)
Decreased to 30% fio2  

## 2017-07-09 ENCOUNTER — Inpatient Hospital Stay: Payer: Medicare Other

## 2017-07-09 ENCOUNTER — Inpatient Hospital Stay (HOSPITAL_COMMUNITY)
Admit: 2017-07-09 | Discharge: 2017-07-09 | Disposition: A | Discharge status: Home / Self Care | Payer: Medicare Other | Attending: Internal Medicine | Admitting: Internal Medicine

## 2017-07-09 DIAGNOSIS — I340 Nonrheumatic mitral (valve) insufficiency: Secondary | ICD-10-CM

## 2017-07-09 LAB — LACTATE DEHYDROGENASE, PLEURAL OR PERITONEAL FLUID: LD FL: 69 U/L — AB (ref 3–23)

## 2017-07-09 LAB — GLUCOSE, PLEURAL OR PERITONEAL FLUID: GLUCOSE FL: 134 mg/dL

## 2017-07-09 LAB — CBC
HCT: 30 % — ABNORMAL LOW (ref 40.0–52.0)
Hemoglobin: 9.1 g/dL — ABNORMAL LOW (ref 13.0–18.0)
MCH: 29 pg (ref 26.0–34.0)
MCHC: 30.3 g/dL — ABNORMAL LOW (ref 32.0–36.0)
MCV: 95.8 fL (ref 80.0–100.0)
PLATELETS: 383 10*3/uL (ref 150–440)
RBC: 3.14 MIL/uL — AB (ref 4.40–5.90)
RDW: 20.6 % — ABNORMAL HIGH (ref 11.5–14.5)
WBC: 6.4 10*3/uL (ref 3.8–10.6)

## 2017-07-09 LAB — BODY FLUID CELL COUNT WITH DIFFERENTIAL
Eos, Fluid: 0 %
Lymphs, Fluid: 46 %
Monocyte-Macrophage-Serous Fluid: 2 %
Neutrophil Count, Fluid: 50 %
Other Cells, Fluid: 2 %
WBC FLUID: 141 uL

## 2017-07-09 LAB — ECHOCARDIOGRAM COMPLETE
HEIGHTINCHES: 66 in
WEIGHTICAEL: 2684.32 [oz_av]

## 2017-07-09 LAB — BASIC METABOLIC PANEL
ANION GAP: 9 (ref 5–15)
BUN: 33 mg/dL — ABNORMAL HIGH (ref 6–20)
CALCIUM: 8.8 mg/dL — AB (ref 8.9–10.3)
CHLORIDE: 97 mmol/L — AB (ref 101–111)
CO2: 31 mmol/L (ref 22–32)
CREATININE: 2.88 mg/dL — AB (ref 0.61–1.24)
GFR calc non Af Amer: 18 mL/min — ABNORMAL LOW (ref >60)
GFR, EST AFRICAN AMERICAN: 21 mL/min — AB (ref >60)
Glucose, Bld: 137 mg/dL — ABNORMAL HIGH (ref 65–99)
Potassium: 4.4 mmol/L (ref 3.5–5.1)
SODIUM: 137 mmol/L (ref 135–145)

## 2017-07-09 LAB — PROTEIN, PLEURAL OR PERITONEAL FLUID

## 2017-07-09 LAB — GLUCOSE, CAPILLARY
GLUCOSE-CAPILLARY: 146 mg/dL — AB (ref 65–99)
GLUCOSE-CAPILLARY: 138 mg/dL — AB (ref 65–99)
GLUCOSE-CAPILLARY: 123 mg/dL — AB (ref 65–99)
Glucose-Capillary: 148 mg/dL — ABNORMAL HIGH (ref 65–99)
Glucose-Capillary: 208 mg/dL — ABNORMAL HIGH (ref 65–99)

## 2017-07-09 MED ORDER — ALBUMIN HUMAN 25 % IV SOLN
25.0000 g | Freq: Once | INTRAVENOUS | Status: DC
  Filled 2017-07-09 (×2): qty 100

## 2017-07-09 MED ORDER — EPOETIN ALFA 10000 UNIT/ML IJ SOLN
4000.0000 [IU] | INTRAMUSCULAR | Status: DC
  Administered 2017-07-10 – 2017-07-14 (×3): 4000 [IU] via INTRAVENOUS

## 2017-07-09 NOTE — Progress Notes (Signed)
1800 Better afternoon. Respiratory effort less. Oxygen saturations better. More interactive.

## 2017-07-09 NOTE — Progress Notes (Signed)
PT Cancellation Note  Patient Details Name: Kenneth [name] T Salonga MRN: [id number] DOB: 01/10/1928   Cancelled Treatment:    Reason Eval/Treat Not Completed: Patient at procedure or test/unavailable; Upon entering pt's room nursing reported pt has pending thoracentesis with transport in route.  Nursing requested hold on PT eval until 07/10/17.  Will attempt PT eval at a future date per nursing request.   D. Elly ModenaScott Abdulrahim Siddiqi PT, DPT 07/09/17, 1:33 PM

## 2017-07-09 NOTE — Progress Notes (Signed)
*  PRELIMINARY RESULTS* Echocardiogram 2D Echocardiogram has been performed.  Camrin Lapre S Lynae Pederson 07/09/2017, 11:47 AM 

## 2017-07-09 NOTE — Procedures (Signed)
  Procedure: LEFT US thoracentesis   Preprocedure diagnosis: Effusions Postprocedure diagnosis: same EBL:   minimal Complications:  none immediate, CXR to follow  See full dictation in [facility] P[facility].  Thora Lance. Solange Emry MD Main # [phone] Pager  [phone]

## 2017-07-09 NOTE — Progress Notes (Signed)
[facility] [place] [facility]   PATIENT NAME: Kenneth [name]    MR#:  [id number]  DATE OF BIRTH:  1928-03-10  SUBJECTIVE:  CHIEF COMPLAINT:   Chief Complaint  Patient presents with  . Shortness of Breath  . Chest Pain   - Very alert and communicative this morning. On Ventimask at 55% FiO2 -Denies any complaints  REVIEW OF SYSTEMS:  Review of Systems  Constitutional: Positive for malaise/fatigue. Negative for chills and fever.  HENT: Negative for congestion, ear discharge, hearing loss and nosebleeds.   Eyes: Negative for blurred vision and double vision.  Respiratory: Positive for shortness of breath. Negative for cough and wheezing.   Cardiovascular: Positive for leg swelling. Negative for chest pain and palpitations.  Gastrointestinal: Negative for abdominal pain, constipation, diarrhea, nausea and vomiting.  Genitourinary: Negative for dysuria.  Musculoskeletal: Negative for myalgias.  Neurological: Negative for dizziness, sensory change, speech change, focal weakness, seizures and headaches.  Psychiatric/Behavioral: Negative for depression.    DRUG ALLERGIES:   Allergies  Allergen Reactions  . Bee Venom   . Beef-Derived Products Hives  . Iodine Hives  . Ivp Dye [Iodinated Diagnostic Agents] Hives    VITALS:  Blood pressure 131/69, pulse (!) 109, temperature 98.8 F (37.1 C), temperature source Oral, resp. rate (!) 24, height 5\' 6"  (1.676 m), weight 76.1 kg (167 lb 12.3 oz), SpO2 100 %.  PHYSICAL EXAMINATION:  Physical Exam  GENERAL:  83 y.o.-year-old patient lying in the bed with no acute distress.  EYES: Pupils equal, round, reactive to light and accommodation. No scleral icterus. Extraocular muscles intact.  HEENT: Head atraumatic, normocephalic. Oropharynx and nasopharynx clear. On Bipap NECK:  Supple, no jugular venous distention. No thyroid enlargement, no tenderness.  LUNGS: Normal breath sounds bilaterally, no wheezing,  rales,rhonchi or crepitation. No use of accessory muscles of respiration. Decreased bibasilar breath sounds with occasional crackles CARDIOVASCULAR: S1, S2 normal. No  rubs, or gallops. 2/6 systolic murmur present  ABDOMEN: Soft, nontender, nondistended. Bowel sounds present. No organomegaly or mass.  EXTREMITIES: No  cyanosis, or clubbing. 1+ pedal edema noted NEUROLOGIC: Cranial nerves II through XII are intact. Muscle strength 5/5 in all extremities. Sensation intact. Gait not checked. Global weakness noted.  PSYCHIATRIC: The patient is alert and oriented to self  SKIN: No obvious rash, lesion, or ulcer.    LABORATORY PANEL:   CBC Recent Labs  Lab 07/09/17 0429  WBC 6.4  HGB 9.1*  HCT 30.0*  PLT 383   ------------------------------------------------------------------------------------------------------------------  Chemistries  Recent Labs  Lab 07/07/17 1617 07/08/17 0446 07/09/17 0429  NA 137 138 137  K 3.4* 5.1 4.4  CL 94* 96* 97*  CO2 32 34* 31  GLUCOSE 155* 186* 137*  BUN 11 20 33*  CREATININE 1.81* 2.59* 2.88*  CALCIUM 8.5* 8.1* 8.8*  MG  --  2.1  --   AST 21  --   --   ALT 7*  --   --   ALKPHOS 76  --   --   BILITOT 0.7  --   --    ------------------------------------------------------------------------------------------------------------------  Cardiac Enzymes Recent Labs  Lab 07/07/17 1617  TROPONINI 0.06*   ------------------------------------------------------------------------------------------------------------------  RADIOLOGY:  Dg Chest Portable 1 View  Result Date: 07/07/2017 CLINICAL DATA:  Short of breath EXAM: PORTABLE CHEST 1 VIEW COMPARISON:  05/30/2017, 04/19/2017, CT chest 12/07/2015 FINDINGS: Post sternotomy changes. Moderate left greater than right pleural effusions, likely layering and contributing to hazy opacity in the thoraces. Cardiomegaly with  vascular congestion and suspected mild pulmonary edema. Bibasilar consolidation.  IMPRESSION: Moderate left greater than right pleural effusions with bibasilar consolidations. Cardiomegaly with vascular congestion and suspected pulmonary edema. Electronically Signed   By: Jasmine PangKim  Fujinaga M.D.   On: 07/07/2017 16:46    EKG:   Orders placed or performed during the hospital encounter of 07/07/17  . ED EKG  . ED EKG    ASSESSMENT AND PLAN:   Kenneth [name]  is a 83 y.o. male with a known history of sepsis, coronary artery disease status post CABG, hypertension, COPD on chronic 2 L nasal cannula oxygen, recurrent pleural effusion from congestive heart failure with recurrent thoracentesis last thoracentesis done was May 30, 2017 950 cc of fluid was removed comes to the emergency room after dialysis when he started having increasing shortness of breath.  1.  Acute on chronic hypoxic respiratory failure secondary to acute on chronic diastolic congestive heart failure/pulmonary edema -remained hypoxic even after dialysis, currently on Ventimask at 55% FiO2 -X-ray showed bilateral pulmonary edema with bilateral pleural effusion left > right- - not responded well to lasix as anuric at baseline - Discussed with IR for thoracentesis on Saturday, 07/08/2017, Dr. Deanne CofferHassell said somebody will be coming into doing the procedures and have not heard back from him and the procedure is not done as of now. -PRN breathing treatments -Hemodialysis again done 07/08/17 -Last echo almost 2 years ago with diastolic dysfunction and EF of 55%. With cardiomegaly noted on chest x-ray, recommend repeating echocardiogram due to recurrent effusions  2.  Bilateral pleural effusion with recurrent thoracentesis -Since last thoracentesis was on 05/30/2017 with 950 cc removed -Has had several thoracentesis done since June 2018 alternating both on right and the left sides. -Discussed with IR again that he will need thoracentesis this admission  3.  End-stage renal disease on hemodialysis -Had progressively  worsening CK D, has been on dialysis for almost 2 years now. -Nephrology consulted.  4.  Hypotension-started on midodrine twice a day  5.  Diabetes sliding scale insulin and home meds  6.  DVT prophylaxis subcu heparin  Physical therapy once more stable. At baseline has a walker but only ambulate a few steps due to chronic dyspnea    All the records are reviewed and case discussed with Care Management/Social Workerr. Management plans discussed with the patient, family and they are in agreement.  CODE STATUS: DNR  TOTAL TIME TAKING CARE OF THIS PATIENT: 34 minutes.   POSSIBLE D/C IN 1-2 DAYS, DEPENDING ON CLINICAL CONDITION.   Enid BaasKALISETTI,Simcha Farrington M.D on 07/09/2017 at 8:40 AM  Between 7am to 6pm - Pager - [phone]  After 6pm go to [email or web address] - Social research officer, governmentpassword EPAS [facility]  [facility]  Office  [phone]  CC: Primary care physician; Barbette ReichmannHande, Vishwanath, MD

## 2017-07-09 NOTE — Progress Notes (Signed)
1345 Escorted down to Ultrasound for procedure. 1500 Back from procedure. Ultrasound called for report.1515. Left posterior site checked. Band-Aid in place without drainage.

## 2017-07-09 NOTE — Progress Notes (Signed)
[facility] Progess Note    SYNOPSIS   83 year old male with a history of end-stage renal disease hemodialysis who presented to the ED with acute shortness of breath after dialysis.    ASSESSMENT/PLAN   Respiratory failure. Volume overload, status post hemodialysis yesterday, improved FiO2 requirements. Still with left pleural effusion, interventional radiology consult. Nonemergent to drain. We'll continue supplemental oxygen to keep oxygen saturations greater than 88%, bronchodilators as needed  Renal failure. On hemodialysis, pending renal if he will get dialyzed again today.  Name: Kenneth [name] MRN: [id number] DOB: 10-15-1927    ADMISSION DATE:  07/07/2017    CHIEF COMPLAINT:  Shortness of breath   SUBJECTIVE:   [name] underwent hemodialysis yesterday. Improved oxygenation and FiO2 requirements. Weaned to nasal cannula. Complaining of some shortness of breath today presently on a facemask  VITAL SIGNS: Temp:  [98.8 F (37.1 C)-99 F (37.2 C)] 98.8 F (37.1 C) (11/18 0200) Pulse Rate:  [85-119] 119 (11/18 0900) Resp:  [14-28] 22 (11/18 0900) BP: (89-131)/(46-86) 112/86 (11/18 0900) SpO2:  [84 %-100 %] 100 % (11/18 0900) FiO2 (%):  [45 %] 45 % (11/18 0400)  PHYSICAL EXAMINATION: Physical Examination:   VS: BP 112/86   Pulse (!) 119   Temp 98.8 F (37.1 C) (Oral)   Resp (!) 22   Ht 5\' 6"  (1.676 m)   Wt 76.1 kg (167 lb 12.3 oz)   SpO2 100%   BMI 27.08 kg/m   General Appearance: No distress  Neuro:without focal findings, mental status normal. Pulmonary: Diminished breath sounds on the left with inspiratory crackles Cardiovascular Normal S1,S2. Abdomen: Benign, Soft, non-tender. Skin:   warm, no rashes, no ecchymosis  Extremities: normal, no cyanosis, clubbing.  LABORATORY PANEL:   CBC Recent Labs  Lab 07/09/17 0429  WBC 6.4  HGB 9.1*  HCT 30.0*  PLT 383    Chemistries  Recent Labs  Lab 07/07/17 1617  07/08/17 0446 07/09/17 0429  NA 137 138 137  K 3.4* 5.1 4.4  CL 94* 96* 97*  CO2 32 34* 31  GLUCOSE 155* 186* 137*  BUN 11 20 33*  CREATININE 1.81* 2.59* 2.88*  CALCIUM 8.5* 8.1* 8.8*  MG  --  2.1  --   PHOS  --  4.9*  --   AST 21  --   --   ALT 7*  --   --   ALKPHOS 76  --   --   BILITOT 0.7  --   --     Recent Labs  Lab 07/08/17 0758 07/08/17 1200 07/08/17 1714 07/08/17 2220 07/09/17 0814 07/09/17 0957  GLUCAP 162* 94 102* 132* 146* 148*   No results for input(s): PHART, PCO2ART, PO2ART in the last 168 hours. Recent Labs  Lab 07/07/17 1617  AST 21  ALT 7*  ALKPHOS 76  BILITOT 0.7  ALBUMIN 3.1*    Cardiac Enzymes Recent Labs  Lab 07/07/17 1617  TROPONINI 0.06*    RADIOLOGY:  Dg Chest Portable 1 View  Result Date: 07/07/2017 CLINICAL DATA:  Short of breath EXAM: PORTABLE CHEST 1 VIEW COMPARISON:  05/30/2017, 04/19/2017, CT chest 12/07/2015 FINDINGS: Post sternotomy changes. Moderate left greater than right pleural effusions, likely layering and contributing to hazy opacity in the thoraces. Cardiomegaly with vascular congestion and suspected mild pulmonary edema. Bibasilar consolidation. IMPRESSION: Moderate left greater than right pleural effusions with bibasilar consolidations. Cardiomegaly with vascular congestion and suspected pulmonary edema. Electronically Signed   By: Adrian ProwsKim  Fujinaga M.D.  On: 07/07/2017 16:46   Tora KindredJohn Jep Dyas, DO  07/09/2017

## 2017-07-09 NOTE — Progress Notes (Signed)
[facility] [place], [place] 07/09/17  Subjective:    900 cc removed with HD yesterday Thoracentesis was not done Patient states that he overall feels okay He is sitting at the side of the bed eating breakfast when seen Still requiring oxygen supplementation with nasal cannula  Objective:  Vital signs in last 24 hours:  Temp:  [98.8 F (37.1 C)-99 F (37.2 C)] 98.8 F (37.1 C) (11/18 0200) Pulse Rate:  [85-119] 119 (11/18 0900) Resp:  [14-28] 22 (11/18 0900) BP: (89-131)/(46-86) 112/86 (11/18 0900) SpO2:  [84 %-100 %] 100 % (11/18 0900) FiO2 (%):  [45 %] 45 % (11/18 0400)  Weight change: 3.524 kg (7 lb 12.3 oz) Filed Weights   07/07/17 1622 07/07/17 2330 07/08/17 1000  Weight: 72.6 kg (160 lb) 72.8 kg (160 lb 7.9 oz) 76.1 kg (167 lb 12.3 oz)    Intake/Output:    Intake/Output Summary (Last 24 hours) at 07/09/2017 1009 Last data filed at 07/09/2017 0700 Gross per 24 hour  Intake 720 ml  Output 1285 ml  Net -565 ml     Physical Exam: General:  Elderly, frail gentleman,  HEENT CPAP mask in place  Neck  supple  Pulm/lungs  decreased breath sounds at bases, mild scattered rhonchi  CVS/Heart  tachycardic, regular, no rub or gallop  Abdomen:   Soft, nontender, nondistended  Extremities:  Trace to 1+ pitting edema  Neurologic:  Alert, able to follow simple commands, decreased hearing  Skin:  No acute rashes          Basic Metabolic Panel:  Recent Labs  Lab 07/07/17 1617 07/08/17 0446 07/09/17 0429  NA 137 138 137  K 3.4* 5.1 4.4  CL 94* 96* 97*  CO2 32 34* 31  GLUCOSE 155* 186* 137*  BUN 11 20 33*  CREATININE 1.81* 2.59* 2.88*  CALCIUM 8.5* 8.1* 8.8*  MG  --  2.1  --   PHOS  --  4.9*  --      CBC: Recent Labs  Lab 07/07/17 1750 07/08/17 0446 07/09/17 0429  WBC 6.5 4.7 6.4  NEUTROABS 5.6  --   --   HGB 10.3* 9.6* 9.1*  HCT 32.9* 31.9* 30.0*  MCV 94.9 96.0 95.8  PLT 298 331 383      Lab Results  Component Value Date    HEPBSAG Negative 04/19/2017   HEPBSAB Non Reactive 09/01/2015      Microbiology:  Recent Results (from the past 240 hour(s))  Microscopic Examination     Status: Abnormal   Collection Time: 07/04/17  4:14 PM  Result Value Ref Range Status   WBC, UA 0-5 0 - 5 /hpf Final   RBC, UA >30 (A) 0 - 2 /hpf Final   Epithelial Cells (non renal) 0-10 0 - 10 /hpf Final   Renal Epithel, UA 0-10 None seen /hpf Final   Casts Present (A) None seen /lpf Final   Cast Type Hyaline casts N/A Final   Mucus, UA Present (A) Not Estab. Final   Bacteria, UA Moderate (A) None seen/Few Final  MRSA PCR Screening     Status: None   Collection Time: 07/07/17 11:22 PM  Result Value Ref Range Status   MRSA by PCR NEGATIVE NEGATIVE Final    Comment:        The GeneXpert MRSA Assay (FDA approved for NASAL specimens only), is one component of a comprehensive MRSA colonization surveillance program. It is not intended to diagnose MRSA infection nor to guide or monitor  treatment for MRSA infections.     Coagulation Studies: No results for input(s): LABPROT, INR in the last 72 hours.  Urinalysis: No results for input(s): COLORURINE, LABSPEC, PHURINE, GLUCOSEU, HGBUR, BILIRUBINUR, KETONESUR, PROTEINUR, UROBILINOGEN, NITRITE, LEUKOCYTESUR in the last 72 hours.  Invalid input(s): APPERANCEUR    Imaging: Dg Chest Portable 1 View  Result Date: 07/07/2017 CLINICAL DATA:  Short of breath EXAM: PORTABLE CHEST 1 VIEW COMPARISON:  05/30/2017, 04/19/2017, CT chest 12/07/2015 FINDINGS: Post sternotomy changes. Moderate left greater than right pleural effusions, likely layering and contributing to hazy opacity in the thoraces. Cardiomegaly with vascular congestion and suspected mild pulmonary edema. Bibasilar consolidation. IMPRESSION: Moderate left greater than right pleural effusions with bibasilar consolidations. Cardiomegaly with vascular congestion and suspected pulmonary edema. Electronically Signed   By: Jasmine PangKim   Fujinaga M.D.   On: 07/07/2017 16:46     Medications:    . calcium acetate  2,001 mg Oral TID  . cinacalcet  30 mg Oral Daily  . feeding supplement (NEPRO CARB STEADY)  237 mL Oral Q24H  . ferrous sulfate  325 mg Oral Daily  . furosemide  20 mg Intravenous Q12H  . heparin  5,000 Units Subcutaneous Q8H  . insulin aspart  0-9 Units Subcutaneous TID WC  . ipratropium-albuterol  3 mL Nebulization Q6H  . mouth rinse  15 mL Mouth Rinse q12n4p  . megestrol  20 mg Oral Daily  . metoprolol succinate  12.5 mg Oral Daily  . midodrine  5 mg Oral BID WC  . pravastatin  20 mg Oral q1800  . senna  1 tablet Oral BID  . sertraline  25 mg Oral Daily  . tamsulosin  0.4 mg Oral Daily  . tiotropium  1 capsule Inhalation Daily  . traZODone  50 mg Oral QHS   acetaminophen **OR** acetaminophen, albuterol, ondansetron **OR** ondansetron (ZOFRAN) IV  Assessment/ Plan:  83 y.o. male with end-stage kidney disease, dialysis,diabetes type 2, coronary disease, CABG, anemia of chronic kidney disease, anxiety disorder, GERD, recurrent pleural effusions s/p thoracentesis .  [facility]. MWF-1. [facility]/Dr Kolluru  1.  ESRD on HD MWF 2.  Acute shortness of breath from pulmonary edema with bilateral pleural effusions.   3.  Anemia of chronic kidney disease 4.  Secondary hyperparathyroidism  Extra hemodialysis was done yesterday.  Ultrafiltration of 900 cc total.  Middle drain and IV albumin were used.  Further ultrafiltration is limited by hypotension.  Patient is scheduled for evaluation of thoracenteses by VIR today. Continue Procrit with his routine dialysis days Monitor phosphorus during hospital stay.  Current level of 4.9. Next scheduled hemodialysis will be Monday    LOS: 2 [facility] Hospital[facility],Alecea Trego 11/18/201810:09 AM  [facility] [place], [place] [phone]

## 2017-07-10 LAB — GLUCOSE, CAPILLARY
GLUCOSE-CAPILLARY: 169 mg/dL — AB (ref 65–99)
GLUCOSE-CAPILLARY: 72 mg/dL (ref 65–99)
Glucose-Capillary: 134 mg/dL — ABNORMAL HIGH (ref 65–99)
Glucose-Capillary: 210 mg/dL — ABNORMAL HIGH (ref 65–99)

## 2017-07-10 LAB — HEPATITIS B SURFACE ANTIGEN: HEP B S AG: NEGATIVE

## 2017-07-10 LAB — HEPATITIS B SURFACE ANTIBODY, QUANTITATIVE: Hepatitis B-Post: <3.1 m[IU]/mL — ABNORMAL LOW (ref >9.9)

## 2017-07-10 MED ORDER — RENA-VITE PO TABS
1.0000 | ORAL_TABLET | Freq: Every day | ORAL | Status: DC
  Administered 2017-07-10 – 2017-07-13 (×4): 1 via ORAL
  Filled 2017-07-10 (×5): qty 1

## 2017-07-10 MED ORDER — NEPRO/CARBSTEADY PO LIQD
237.0000 mL | Freq: Two times a day (BID) | ORAL | Status: DC
  Administered 2017-07-11 – 2017-07-14 (×6): 237 mL via ORAL

## 2017-07-10 MED ORDER — VITAMIN C 500 MG PO TABS
500.0000 mg | ORAL_TABLET | Freq: Every day | ORAL | Status: DC
  Administered 2017-07-12 – 2017-07-13 (×2): 500 mg via ORAL
  Filled 2017-07-10 (×10): qty 1

## 2017-07-10 MED ORDER — TIOTROPIUM BROMIDE MONOHYDRATE 18 MCG IN CAPS
1.0000 | ORAL_CAPSULE | Freq: Every day | RESPIRATORY_TRACT | Status: DC
  Administered 2017-07-12 – 2017-07-14 (×3): 18 ug via RESPIRATORY_TRACT
  Filled 2017-07-10: qty 5

## 2017-07-10 NOTE — Progress Notes (Signed)
[employer] [facility] [place] [facility]   PATIENT NAME: Kenneth [name]    MR#:  [id number]  DATE OF BIRTH:  1927/11/08  SUBJECTIVE:  CHIEF COMPLAINT:   Chief Complaint  Patient presents with  . Shortness of Breath  . Chest Pain   - Off Ventimask, on 6 L oxygen. Still appears slightly tachypneic with minimal exertion. -Had left chest thoracentesis with 1 L fluid taken out yesterday  REVIEW OF SYSTEMS:  Review of Systems  Constitutional: Positive for malaise/fatigue. Negative for chills and fever.  HENT: Negative for congestion, ear discharge, hearing loss and nosebleeds.   Eyes: Negative for blurred vision and double vision.  Respiratory: Positive for shortness of breath. Negative for cough and wheezing.   Cardiovascular: Negative for chest pain, palpitations and leg swelling.  Gastrointestinal: Negative for abdominal pain, constipation, diarrhea, nausea and vomiting.  Genitourinary: Negative for dysuria.  Musculoskeletal: Negative for myalgias.  Neurological: Negative for dizziness, sensory change, speech change, focal weakness, seizures and headaches.  Psychiatric/Behavioral: Negative for depression.    DRUG ALLERGIES:   Allergies  Allergen Reactions  . Bee Venom   . Beef-Derived Products Hives  . Iodine Hives  . Ivp Dye [Iodinated Diagnostic Agents] Hives    VITALS:  Blood pressure (!) 102/55, pulse 100, temperature 99 F (37.2 C), temperature source Oral, resp. rate (!) 24, height 5\' 6"  (1.676 m), weight 76.1 kg (167 lb 12.3 oz), SpO2 97 %.  PHYSICAL EXAMINATION:  Physical Exam  GENERAL:  83 y.o.-year-old patient lying in the bed with no acute distress.  EYES: Pupils equal, round, reactive to light and accommodation. No scleral icterus. Extraocular muscles intact.  HEENT: Head atraumatic, normocephalic. Oropharynx and nasopharynx clear. On Bipap NECK:  Supple, no jugular venous distention. No thyroid enlargement, no tenderness.  LUNGS: Normal  breath sounds bilaterally, no wheezing, rales,rhonchi or crepitation. No use of accessory muscles of respiration. Decreased bibasilar breath sounds with occasional crackles CARDIOVASCULAR: S1, S2 normal. No  rubs, or gallops. 2/6 systolic murmur present  ABDOMEN: Soft, nontender, nondistended. Bowel sounds present. No organomegaly or mass.  EXTREMITIES: No  cyanosis, or clubbing. 1+ pedal edema noted NEUROLOGIC: Cranial nerves II through XII are intact. Muscle strength 5/5 in all extremities. Sensation intact. Gait not checked. Global weakness noted.  PSYCHIATRIC: The patient is alert and oriented x 3   SKIN: No obvious rash, lesion, or ulcer.    LABORATORY PANEL:   CBC Recent Labs  Lab 07/09/17 0429  WBC 6.4  HGB 9.1*  HCT 30.0*  PLT 383   ------------------------------------------------------------------------------------------------------------------  Chemistries  Recent Labs  Lab 07/07/17 1617 07/08/17 0446 07/09/17 0429  NA 137 138 137  K 3.4* 5.1 4.4  CL 94* 96* 97*  CO2 32 34* 31  GLUCOSE 155* 186* 137*  BUN 11 20 33*  CREATININE 1.81* 2.59* 2.88*  CALCIUM 8.5* 8.1* 8.8*  MG  --  2.1  --   AST 21  --   --   ALT 7*  --   --   ALKPHOS 76  --   --   BILITOT 0.7  --   --    ------------------------------------------------------------------------------------------------------------------  Cardiac Enzymes Recent Labs  Lab 07/07/17 1617  TROPONINI 0.06*   ------------------------------------------------------------------------------------------------------------------  RADIOLOGY:  Dg Chest Port 1 View  Result Date: 07/09/2017 CLINICAL DATA:  Status post left thoracentesis EXAM: PORTABLE CHEST 1 VIEW COMPARISON:  07/09/2017 FINDINGS: Cardiac shadow is stable. Postoperative changes are again seen. Stable right pleural effusion is noted with  basilar atelectasis. There is been interval left thoracentesis with decrease in the degree of pleural effusion. Persistent  infiltrative density is noted in the base. No pneumothorax is seen. No bony abnormality is noted. IMPRESSION: No evidence of post thoracentesis pneumothorax. Reduction in left pleural effusion is noted. Electronically Signed   By: Alcide CleverMark  Lukens M.D.   On: 07/09/2017 14:23   Dg Chest Port 1 View  Result Date: 07/09/2017 CLINICAL DATA:  Patient reports continuing SOB. Hx HTN, DM, cardiac cath. Former smoker. EXAM: PORTABLE CHEST - 1 VIEW COMPARISON:  07/07/2017 FINDINGS: Pleural effusions left greater than right, stable. Consolidation/ atelectasis in the lung bases as before. Mild central pulmonary vascular congestion. Heart size upper limits normal. Previous median sternotomy and CABG. No pneumothorax. IMPRESSION: Stable pleural effusions, cardiomegaly and pulmonary vascular congestion. Electronically Signed   By: Corlis Leak  Hassell M.D.   On: 07/09/2017 10:46   [place] Thoracentesis Asp Pleural Space W/img Guide  Result Date: 07/09/2017 INDICATION: Shortness of breath. Bilateral pleural effusions, left greater than right. EXAM: ULTRASOUND GUIDED LEFT THORACENTESIS MEDICATIONS: None indicated PROCEDURE: An ultrasound guided thoracentesis was thoroughly discussed with the patient and questions answered. The benefits, risks, alternatives and complications were also discussed. The patient understands and wishes to proceed with the procedure. Written consent was obtained. Ultrasound was performed to localize and mark an adequate pocket of fluid in the left chest. The area was then prepped and draped in the normal sterile fashion. 1% Lidocaine was used for local anesthesia. Under ultrasound guidance a Safe-T-Centesis catheter was introduced. Thoracentesis was performed. The catheter was removed and a dressing applied. FINDINGS: A total of approximately 1 L of blood-tinged fluid was removed. Samples were sent to the laboratory as requested by the clinical team. Aspiration was terminated when the patient described pain  associated with the procedure, and his symptoms resolved. The patient tolerated the procedure well. COMPLICATIONS: None immediate. IMPRESSION: Successful ultrasound guided left thoracentesis yielding 1 L of pleural fluid. Follow-up chest radiograph shows no pneumothorax. Electronically Signed   By: Corlis Leak  Hassell M.D.   On: 07/09/2017 15:00    EKG:   Orders placed or performed during the hospital encounter of 07/07/17  . ED EKG  . ED EKG    ASSESSMENT AND PLAN:   [place] CalixHollan Calame  is a 83 y.o. male with a known history of sepsis, coronary artery disease status post CABG, hypertension, COPD on chronic 2 L nasal cannula oxygen, recurrent pleural effusion from congestive heart failure with recurrent thoracentesis last thoracentesis done was May 30, 2017 950 cc of fluid was removed comes to the emergency room after dialysis when he started having increasing shortness of breath.  1.  Acute on chronic hypoxic respiratory failure secondary to acute on chronic diastolic congestive heart failure/pulmonary edema -Was on BiPAP initially and then Ventimask. Status post left-sided thoracentesis on 07/09/2017 and 1 L taken out. -Patient on 6 L nasal cannula and more alert. -Has repeat episodes of pulmonary edema and pleural effusion worsening-transudate in all cases. Likely insufficient pulling out of fluid during dialysis. -Continue to wean off oxygen to 3 L which is his baseline. --PRN breathing treatments -Dialysis again per schedule today -Repeat echo again with diastolic dysfunction and EF of 55%.   2.  Bilateral pleural effusion with recurrent thoracentesis -Left-sided thoracentesis done in 1 L taken out this admission. Patient down to 6 L nasal cannula at this time  3.  End-stage renal disease on hemodialysis -Had progressively worsening CK D, has been on dialysis  for almost 2 years now. -Nephrology consulted. -Regularly on a Monday, Wednesday and Friday schedule  4.  Hypotension-started on  midodrine  5.  Diabetes sliding scale insulin and home meds  6.  DVT prophylaxis subcu heparin  Physical therapy today. At baseline has a walker but only ambulate a few steps due to chronic dyspnea Transfer out of ICU today -Palliative care consulted for goals of treatment as patient's overall clinical condition has been declining lately according to daughter    All the records are reviewed and case discussed with Care Management/Social Workerr. Management plans discussed with the patient, family and they are in agreement.  CODE STATUS: DNR  TOTAL TIME TAKING CARE OF THIS PATIENT: 34 minutes.   POSSIBLE D/C IN 1-2 DAYS, DEPENDING ON CLINICAL CONDITION.   Enid BaasKALISETTI,Luana Tatro M.D on 07/10/2017 at 9:46 AM  Between 7am to 6pm - Pager - [phone]  After 6pm go to [email or web address] - Social research officer, governmentpassword EPAS [facility]  [facility]  Office  [phone]  CC: Primary care physician; Barbette ReichmannHande, Vishwanath, MD

## 2017-07-10 NOTE — Progress Notes (Signed)
1300 down to dialysis unit for hemodialysis treatment.

## 2017-07-10 NOTE — Progress Notes (Signed)
Pre HD  

## 2017-07-10 NOTE — Progress Notes (Signed)
Patient was seen on morning once today.  He is presently in dialysis.  He has transfer orders for MedSurg floor.  He was comfortable on nasal cannula oxygen when I saw him earlier today.  He underwent left thoracentesis yesterday with fluid chemistry consistent with transudate.  I suspect that we need to be a little more aggressive with volume removal.  After transfer, PCCM will sign off. Please call if we can be of further assistance    Kenneth Fischeravid Manya Balash, MD PCCM service Mobile [phone] Pager [phone] 07/10/2017 6:39 PM

## 2017-07-10 NOTE — Progress Notes (Signed)
Post HD assessment unchanged  

## 2017-07-10 NOTE — Progress Notes (Signed)
[facility] [place], [place] 07/10/17  Subjective:  Patient due for hemodialysis today. Orders have been prepared. He appears to be moving out of the critical care unit today.    Objective:  Vital signs in last 24 hours:  Temp:  [98.3 F (36.8 C)-99 F (37.2 C)] 99 F (37.2 C) (11/19 0200) Pulse Rate:  [99-119] 100 (11/19 0600) Resp:  [18-29] 24 (11/19 0600) BP: (96-133)/(50-96) 102/55 (11/19 0600) SpO2:  [84 %-100 %] 97 % (11/19 0717) FiO2 (%):  [40 %] 40 % (11/19 0717)  Weight change:  Filed Weights   07/07/17 1622 07/07/17 2330 07/08/17 1000  Weight: 72.6 kg (160 lb) 72.8 kg (160 lb 7.9 oz) 76.1 kg (167 lb 12.3 oz)    Intake/Output:    Intake/Output Summary (Last 24 hours) at 07/10/2017 0854 Last data filed at 07/10/2017 0600 Gross per 24 hour  Intake 120 ml  Output 175 ml  Net -55 ml     Physical Exam: General:  Elderly, frail gentleman  HEENT  [place]/AT hearing intact  Neck  supple  Pulm/lungs  decreased breath sounds at bases, mild scattered rhonchi  CVS/Heart  S1S2 no rubs  Abdomen:   Soft, nontender, nondistended  Extremities:  Trace to 1+ pitting edema  Neurologic:  Alert, able to follow simple commands  Skin:  No acute rashes          Basic Metabolic Panel:  Recent Labs  Lab 07/07/17 1617 07/08/17 0446 07/09/17 0429  NA 137 138 137  K 3.4* 5.1 4.4  CL 94* 96* 97*  CO2 32 34* 31  GLUCOSE 155* 186* 137*  BUN 11 20 33*  CREATININE 1.81* 2.59* 2.88*  CALCIUM 8.5* 8.1* 8.8*  MG  --  2.1  --   PHOS  --  4.9*  --      CBC: Recent Labs  Lab 07/07/17 1750 07/08/17 0446 07/09/17 0429  WBC 6.5 4.7 6.4  NEUTROABS 5.6  --   --   HGB 10.3* 9.6* 9.1*  HCT 32.9* 31.9* 30.0*  MCV 94.9 96.0 95.8  PLT 298 331 383      Lab Results  Component Value Date   HEPBSAG Negative 04/19/2017   HEPBSAB Non Reactive 09/01/2015      Microbiology:  Recent Results (from the past 240 hour(s))  Microscopic Examination     Status:  Abnormal   Collection Time: 07/04/17  4:14 PM  Result Value Ref Range Status   WBC, UA 0-5 0 - 5 /hpf Final   RBC, UA >30 (A) 0 - 2 /hpf Final   Epithelial Cells (non renal) 0-10 0 - 10 /hpf Final   Renal Epithel, UA 0-10 None seen /hpf Final   Casts Present (A) None seen /lpf Final   Cast Type Hyaline casts N/A Final   Mucus, UA Present (A) Not Estab. Final   Bacteria, UA Moderate (A) None seen/Few Final  MRSA PCR Screening     Status: None   Collection Time: 07/07/17 11:22 PM  Result Value Ref Range Status   MRSA by PCR NEGATIVE NEGATIVE Final    Comment:        The GeneXpert MRSA Assay (FDA approved for NASAL specimens only), is one component of a comprehensive MRSA colonization surveillance program. It is not intended to diagnose MRSA infection nor to guide or monitor treatment for MRSA infections.   Body fluid culture     Status: None (Preliminary result)   Collection Time: 07/09/17  1:55 PM  Result Value Ref Range Status   Specimen Description PLEURAL  Final   Special Requests NONE  Final   Gram Stain   Final    NO WBC SEEN NO ORGANISMS SEEN Performed at [facility], [address] [place], [place] [address]    Culture PENDING  Incomplete   Report Status PENDING  Incomplete    Coagulation Studies: No results for input(s): LABPROT, INR in the last 72 hours.  Urinalysis: No results for input(s): COLORURINE, LABSPEC, PHURINE, GLUCOSEU, HGBUR, BILIRUBINUR, KETONESUR, PROTEINUR, UROBILINOGEN, NITRITE, LEUKOCYTESUR in the last 72 hours.  Invalid input(s): APPERANCEUR    Imaging: Dg Chest Port 1 View  Result Date: 07/09/2017 CLINICAL DATA:  Status post left thoracentesis EXAM: PORTABLE CHEST 1 VIEW COMPARISON:  07/09/2017 FINDINGS: Cardiac shadow is stable. Postoperative changes are again seen. Stable right pleural effusion is noted with basilar atelectasis. There is been interval left thoracentesis with decrease in the degree of pleural effusion.  Persistent infiltrative density is noted in the base. No pneumothorax is seen. No bony abnormality is noted. IMPRESSION: No evidence of post thoracentesis pneumothorax. Reduction in left pleural effusion is noted. Electronically Signed   By: Alcide CleverMark  Lukens M.D.   On: 07/09/2017 14:23   Dg Chest Port 1 View  Result Date: 07/09/2017 CLINICAL DATA:  Patient reports continuing SOB. Hx HTN, DM, cardiac cath. Former smoker. EXAM: PORTABLE CHEST - 1 VIEW COMPARISON:  07/07/2017 FINDINGS: Pleural effusions left greater than right, stable. Consolidation/ atelectasis in the lung bases as before. Mild central pulmonary vascular congestion. Heart size upper limits normal. Previous median sternotomy and CABG. No pneumothorax. IMPRESSION: Stable pleural effusions, cardiomegaly and pulmonary vascular congestion. Electronically Signed   By: Corlis Leak  Hassell M.D.   On: 07/09/2017 10:46   [place] Thoracentesis Asp Pleural Space W/img Guide  Result Date: 07/09/2017 INDICATION: Shortness of breath. Bilateral pleural effusions, left greater than right. EXAM: ULTRASOUND GUIDED LEFT THORACENTESIS MEDICATIONS: None indicated PROCEDURE: An ultrasound guided thoracentesis was thoroughly discussed with the patient and questions answered. The benefits, risks, alternatives and complications were also discussed. The patient understands and wishes to proceed with the procedure. Written consent was obtained. Ultrasound was performed to localize and mark an adequate pocket of fluid in the left chest. The area was then prepped and draped in the normal sterile fashion. 1% Lidocaine was used for local anesthesia. Under ultrasound guidance a Safe-T-Centesis catheter was introduced. Thoracentesis was performed. The catheter was removed and a dressing applied. FINDINGS: A total of approximately 1 L of blood-tinged fluid was removed. Samples were sent to the laboratory as requested by the clinical team. Aspiration was terminated when the patient described  pain associated with the procedure, and his symptoms resolved. The patient tolerated the procedure well. COMPLICATIONS: None immediate. IMPRESSION: Successful ultrasound guided left thoracentesis yielding 1 L of pleural fluid. Follow-up chest radiograph shows no pneumothorax. Electronically Signed   By: Corlis Leak  Hassell M.D.   On: 07/09/2017 15:00     Medications:   . albumin human     . calcium acetate  2,001 mg Oral TID  . cinacalcet  30 mg Oral Daily  . epoetin (EPOGEN/PROCRIT) injection  4,000 Units Intravenous Q M,W,F-HD  . feeding supplement (NEPRO CARB STEADY)  237 mL Oral Q24H  . ferrous sulfate  325 mg Oral Daily  . furosemide  20 mg Intravenous Q12H  . heparin  5,000 Units Subcutaneous Q8H  . insulin aspart  0-9 Units Subcutaneous TID WC  . ipratropium-albuterol  3 mL  Nebulization Q6H  . mouth rinse  15 mL Mouth Rinse q12n4p  . megestrol  20 mg Oral Daily  . metoprolol succinate  12.5 mg Oral Daily  . midodrine  5 mg Oral BID WC  . pravastatin  20 mg Oral q1800  . senna  1 tablet Oral BID  . sertraline  25 mg Oral Daily  . tamsulosin  0.4 mg Oral Daily  . tiotropium  1 capsule Inhalation Daily  . traZODone  50 mg Oral QHS   acetaminophen **OR** acetaminophen, albuterol, ondansetron **OR** ondansetron (ZOFRAN) IV  Assessment/ Plan:  83 y.o. male with end-stage kidney disease, dialysis,diabetes type 2, coronary disease, CABG, anemia of chronic kidney disease, anxiety disorder, GERD, recurrent pleural effusions s/p thoracentesis .  [facility]. MWF-1. [facility]/Dr Kolluru  1.  ESRD on HD MWF 2.  Acute shortness of breath from pulmonary edema with bilateral pleural effusions.   3.  Anemia of chronic kidney disease 4.  Secondary hyperparathyroidism  Patient appears to be doing better.  Hemodialysis has been scheduled for the patient.  He will be moving out of the critical care unit today.  Hemoglobin stable at 9.1.  Patient will be maintained on Epogen 4000 units IV  with dialysis.  In addition he will continue on Sensipar 30 mg p.o. daily.  We will continue to monitor his progress closely.   LOS: 3 Lula Kolton 11/19/20188:54 AM  [address] [place], [place] [phone]

## 2017-07-10 NOTE — Progress Notes (Signed)
HD initiated via L AVF without issue using lidocaine prep per patient request. Patient currently resting quietly on venturi mask. No complaints of pain. Continue to monitor patient.

## 2017-07-10 NOTE — Progress Notes (Signed)
Initial Nutrition Assessment  DOCUMENTATION CODES:   Non-severe (moderate) malnutrition in context of chronic illness  INTERVENTION:  Provide Nepro Shake po BID, each supplement provides 425 kcal and 19 grams protein.  Provide Rena-vite QHS.  Provide Vitamin C 500 mg daily. Patient is at risk for deficiency.  NUTRITION DIAGNOSIS:   Moderate Malnutrition related to chronic illness(ESRD on HD) as evidenced by moderate fat depletion, mild muscle depletion, moderate muscle depletion.  GOAL:   Patient will meet greater than or equal to 90% of their needs  MONITOR:   PO intake, Supplement acceptance, Labs, Weight trends, Skin, I & O's  REASON FOR ASSESSMENT:   Malnutrition Screening Tool    ASSESSMENT:   83 year old male with PMHx of DM type 2, HLD, HTN, CAD s/p CABG in 1999, spinal stenosis, vitamin B12 deficiency, ischemic cardiomyopathy, OSA, anxiety, GERD, nephrolithiasis, recurrent pleural effusions, ESRD on HD since 08/2015 who is admitted with respiratory failure, volume overload.  -Patient s/p left US thoracentesis yesterday for pleural effusion.  Met with patient at bedside. He reports he has had a decreased appetite for at least 3 months now. He has an absence of hunger (anorexia). Denies any N/V, abdominal pain. He eats 2-3 meals per day. For breakfast he has cornflakes or oats with milk or cream. For lunch he has a small blueberry muffin. For dinner he may have a grilled cheese sandwich. He drinks one Nepro daily. Patient reports that here he is only finishing 25-33% of his meals.   Patient reports his UBW was 218 lbs, but he is unsure how long ago that was. Per chart patient was 170.2 lbs on 07/15/2016 and has lost 6.4 lbs (3.8% body weight) over the past year, which is not significant for time frame. Patient believes his dry weight is 73-74 kg.  Medications reviewed and include: calcium acetate 2001 mg TID, Sensipar 30 mg daily, Epogen 4000 units with HD, ferrous sulfate  325 mg daily, Lasix 20 mg Q12hrs, Novolog 0-9 units TID, Megace 20 mg daily, senna, human albumin 25 grams once today in HD.  Labs reviewed: CBG 123-169. On 11/18 Chloride 97, BUN 33, Creatinine 2.88. Potasium was WNL. Phosphorus was 4.9 on 11/17.  Discussed with RN.  NUTRITION - FOCUSED PHYSICAL EXAM:    Most Recent Value  Orbital Region  Moderate depletion  Upper Arm Region  Moderate depletion  Thoracic and Lumbar Region  Mild depletion  Buccal Region  Moderate depletion  Temple Region  Moderate depletion  Clavicle Bone Region  Moderate depletion  Clavicle and Acromion Bone Region  Moderate depletion  Scapular Bone Region  Mild depletion  Dorsal Hand  Mild depletion  Patellar Region  Mild depletion  Anterior Thigh Region  Mild depletion  Posterior Calf Region  Mild depletion  Edema (RD Assessment)  Mild [BLE]  Hair  Reviewed  Eyes  Reviewed  Mouth  Reviewed  Skin  Reviewed [multiple bruises/ecchymosis on BUE]  Nails  Reviewed     Diet Order:  Diet renal with fluid restriction Fluid restriction: 1200 mL Fluid; Room service appropriate? Yes; Fluid consistency: Thin  EDUCATION NEEDS:   No education needs have been identified at this time  Skin:  Skin Assessment: Reviewed RN Assessment  Last BM:  07/08/2017  Height:   Ht Readings from Last 1 Encounters:  07/07/17 '5\' 6"'$  (1.676 m)    Weight:   Wt Readings from Last 1 Encounters:  07/10/17 163 lb 12.8 oz (74.3 kg)    Ideal Body Weight:  64.5 kg  BMI:  Body mass index is 26.44 kg/m.  Estimated Nutritional Needs:   Kcal:  1775-2050 (MSJ x 1.3-1.5)  Protein:  90-105 grams (1.2-1.4 grams/kg)  Fluid:  UOP + 1 L  Kenneth Blade, MS, RD, LDN Office: [phone] Pager: [phone] After Hours/Weekend Pager: [phone]

## 2017-07-10 NOTE — Progress Notes (Signed)
Unable to meet uf goal d/t system clotting with 17 minutes remaining. Patient received albumin and no heparin treatment. Able to return almost all blood. Less than 50cc blood loss. Report given to primary RN

## 2017-07-10 NOTE — Plan of Care (Signed)
VSS O/N, pt. Remains on venti mask @ 40% as pt. Breaths through mouth when sleeping. UOP: 125 mL Pt. Became confused around 2 am, waking up and not remembering where he was.  Pt. Redirectable- but did not sleep well. No other care concerns at this time

## 2017-07-10 NOTE — Progress Notes (Signed)
Pre HD assessment  

## 2017-07-10 NOTE — Evaluation (Signed)
Physical Therapy Evaluation Patient Details Name: Kenneth [name] T Reitan MRN: [id number] DOB: May 05, 1928 Today's Date: 07/10/2017   History of Present Illness  presented to ER after worsening SOB after dialysis; admitted with acute hypoxic respiratory failure due to CHF with bilat pulmonary edema, bilat pleural effusion.  Status post L thoracentesis for 1.0L.  Currently on 12L supplemental O2 with ventimask.  Clinical Impression  Upon evaluation, patient alert and oriented; follows all commands and demonstrates good effort with all activities. Eager for OOB and LE strengthening.  Bilat UE/LE generally 4-/5 with functional ROM throughout.  Currently requiring min assist for bed mobility; min/mod assist for sit/stand, basic transfers and very short-distance gait (5') with RW.  Standing balance very unsteady with poor balance reactions, all planes; very high risk for falls. Unsafe to attempt without RW and +1 at all times (even for very short-distances within the home). Mild/mod SOB with exertion, but maintains sats >93% on ventimask (12L).  Will continue to monitor cardiopulmonary response to activity as oxygen requirements lowered (on 2L at home). Would benefit from skilled PT to address above deficits and promote optimal return to PLOF; recommend transition to STR upon discharge from acute hospitalization.     Follow Up Recommendations SNF    Equipment Recommendations       Recommendations for Other Services       Precautions / Restrictions Precautions Precautions: Fall Precaution Comments: No BP L UE Restrictions Weight Bearing Restrictions: No      Mobility  Bed Mobility Overal bed mobility: Needs Assistance Bed Mobility: Supine to Sit     Supine to sit: Min assist        Transfers Overall transfer level: Needs assistance Equipment used: Rolling walker (2 wheeled) Transfers: Sit to/from Stand Sit to Stand: Min assist;Mod assist         General transfer comment: assist  to position and block feet, as they tend to slide forward; assist for forward trunk lean, anterior weight translation and lift off  Ambulation/Gait Ambulation/Gait assistance: Min assist;Mod assist Ambulation Distance (Feet): 5 Feet Assistive device: Rolling walker (2 wheeled)       General Gait Details: broad BOS, choppy steps with poor balance; poor control and weight shifting.  Unsafe/unable to complete without RW and +1 assist.  Stairs            Wheelchair Mobility    Modified Rankin (Stroke Patients Only)       Balance Overall balance assessment: Needs assistance Sitting-balance support: No upper extremity supported;Feet supported Sitting balance-Leahy Scale: Good     Standing balance support: Bilateral upper extremity supported Standing balance-Leahy Scale: Poor                               Pertinent Vitals/Pain Pain Assessment: No/denies pain    Home Living Family/patient expects to be discharged to:: Private residence Living Arrangements: Spouse/significant other Available Help at Discharge: Family Type of Home: House Home Access: Level entry     Home Layout: One level Home Equipment: Environmental consultantWalker - 2 wheels;Cane - single point;Wheelchair - manual;Grab bars - tub/shower;Grab bars - toilet      Prior Function           Comments: Per previous documentation (confirmed with patient this admission), Pt ambulates very short ambulation distances in home (around 10 feet per pt description) and gets pushed in w/c in community.  Limited mobility d/t SOB at baseline.  Does endorse approx 3  falls in previous year.  Requires assist for ADL's.     Hand Dominance        Extremity/Trunk Assessment   Upper Extremity Assessment Upper Extremity Assessment: Overall WFL for tasks assessed    Lower Extremity Assessment Lower Extremity Assessment: Overall WFL for tasks assessed(grossly at least 4-/5 throughout)       Communication      Cognition  Arousal/Alertness: Awake/alert Behavior During Therapy: WFL for tasks assessed/performed Overall Cognitive Status: Within Functional Limits for tasks assessed                                        General Comments      Exercises     Assessment/Plan    PT Assessment Patient needs continued PT services  PT Problem List Decreased strength;Decreased range of motion;Decreased activity tolerance;Decreased balance;Decreased coordination;Decreased mobility;Decreased knowledge of use of DME;Decreased safety awareness;Decreased knowledge of precautions;Cardiopulmonary status limiting activity       PT Treatment Interventions DME instruction;Gait training;Functional mobility training;Therapeutic activities;Therapeutic exercise;Balance training;Patient/family education    PT Goals (Current goals can be found in the Care Plan section)  Acute Rehab PT Goals Patient Stated Goal: to get out of the bed PT Goal Formulation: With patient Time For Goal Achievement: 07/24/17 Potential to Achieve Goals: Good    Frequency Min 2X/week   Barriers to discharge Decreased caregiver support      Co-evaluation               AM-PAC PT "6 Clicks" Daily Activity  Outcome Measure Difficulty turning over in bed (including adjusting bedclothes, sheets and blankets)?: Unable Difficulty moving from lying on back to sitting on the side of the bed? : Unable Difficulty sitting down on and standing up from a chair with arms (e.g., wheelchair, bedside commode, etc,.)?: Unable Help needed moving to and from a bed to chair (including a wheelchair)?: A Lot Help needed walking in hospital room?: A Lot Help needed climbing 3-5 steps with a railing? : A Lot 6 Click Score: 9    End of Session Equipment Utilized During Treatment: Gait belt;Oxygen Activity Tolerance: Patient tolerated treatment well Patient left: in chair;with call bell/phone within reach Nurse Communication: Mobility status PT  Visit Diagnosis: Unsteadiness on feet (R26.81);Difficulty in walking, not elsewhere classified (R26.2)    Time: [id number] PT Time Calculation (min) (ACUTE ONLY): 23 min   Charges:   PT Evaluation $PT Eval Moderate Complexity: 1 Mod     PT G Codes:   PT G-Codes **NOT FOR INPATIENT CLASS** Functional Assessment Tool Used: AM-PAC 6 Clicks Basic Mobility Functional Limitation: Mobility: Walking and moving around Mobility: Walking and Moving Around Current Status ([id number](G8978): At least 60 percent but less than 80 percent impaired, limited or restricted Mobility: Walking and Moving Around Goal Status [id number]([id number]): At least 1 percent but less than 20 percent impaired, limited or restricted    Breslyn Abdo H. Manson PasseyBrown, PT, DPT, NCS 07/10/17, 2:04 PM [phone]

## 2017-07-11 DIAGNOSIS — R627 Adult failure to thrive: Secondary | ICD-10-CM

## 2017-07-11 DIAGNOSIS — N186 End stage renal disease: Secondary | ICD-10-CM

## 2017-07-11 DIAGNOSIS — Z66 Do not resuscitate: Secondary | ICD-10-CM

## 2017-07-11 DIAGNOSIS — Z992 Dependence on renal dialysis: Secondary | ICD-10-CM

## 2017-07-11 DIAGNOSIS — Z515 Encounter for palliative care: Secondary | ICD-10-CM

## 2017-07-11 LAB — GLUCOSE, CAPILLARY
GLUCOSE-CAPILLARY: 123 mg/dL — AB (ref 65–99)
Glucose-Capillary: 84 mg/dL (ref 65–99)
Glucose-Capillary: 217 mg/dL — ABNORMAL HIGH (ref 65–99)
Glucose-Capillary: 138 mg/dL — ABNORMAL HIGH (ref 65–99)

## 2017-07-11 LAB — PHOSPHORUS: Phosphorus: 2.9 mg/dL (ref 2.5–4.6)

## 2017-07-11 LAB — BASIC METABOLIC PANEL
Anion gap: 6 (ref 5–15)
BUN: 30 mg/dL — AB (ref 6–20)
CO2: 31 mmol/L (ref 22–32)
CREATININE: 3.2 mg/dL — AB (ref 0.61–1.24)
Calcium: 8.7 mg/dL — ABNORMAL LOW (ref 8.9–10.3)
Chloride: 100 mmol/L — ABNORMAL LOW (ref 101–111)
GFR calc Af Amer: 19 mL/min — ABNORMAL LOW (ref >60)
GFR, EST NON AFRICAN AMERICAN: 16 mL/min — AB (ref >60)
Glucose, Bld: 111 mg/dL — ABNORMAL HIGH (ref 65–99)
Potassium: 4.5 mmol/L (ref 3.5–5.1)
SODIUM: 137 mmol/L (ref 135–145)

## 2017-07-11 LAB — HEPATITIS B SURFACE ANTIGEN: HEP B S AG: NEGATIVE

## 2017-07-11 LAB — MISC LABCORP TEST (SEND OUT): LABCORP TEST CODE: 19588

## 2017-07-11 LAB — CYTOLOGY - NON PAP

## 2017-07-11 NOTE — Progress Notes (Signed)
Pre hd 

## 2017-07-11 NOTE — Progress Notes (Signed)
PT Cancellation Note  Patient Details Name: Kenneth [name] T Taves MRN: [id number] DOB: 05-27-1928   Cancelled Treatment:    Reason Eval/Treat Not Completed: Patient at procedure or test/unavailable(Treatment session attempted.  Patient currently off unit for dialysis.  Will re-attempt at later time/date as medically appropriate and available.)   Jakub Debold H. Manson PasseyBrown, PT, DPT, NCS 07/11/17, 10:06 AM [phone]

## 2017-07-11 NOTE — Consult Note (Signed)
Consultation Note Date: 07/11/2017   Patient Name: Kenneth [name]  DOB: Sep 01, 1927  MRN: [id number]  Age / Sex: 83 y.o., male  PCP: Barbette ReichmannHande, Vishwanath, MD Referring Physician: Enid BaasKalisetti, Radhika, MD  Reason for Consultation: Establishing goals of care and Psychosocial/spiritual support  HPI/Patient Profile: 83 y.o. male  admitted on 07/07/2017 with   known history of sepsis, coronary artery disease status post CABG, hypertension, ESRD on dialysis, COPD on chronic 2 L nasal cannula oxygen, recurrent pleural effusion from congestive heart failure with recurrent thoracentesis last thoracentesis done was May 30, 2017 950 cc of fluid was removed comes to the emergency room after dialysis when he started having increasing shortness of breath.  Sats were in the 80s.  Chest x-ray showed bilateral pulmonary edema with bilateral pleural effusion right greater than left.  Patient was placed on BiPAP.   High risk for decompensation regardless of continued medical interventions secondary to multiple comorbidities and overall failure to thrive.  Patient and family face treatment option decisions/continuation of dialysis, advanced directive decisions and anticipatory care needs.  Clinical Assessment and Goals of Care:  This NP Lorinda CreedMary Reis Pienta reviewed medical records, received report from team, assessed the patient and then meet at the patient's bedside along with his wife/Kai Railsback Meacham/HPOA  to discuss diagnosis, prognosis, GOC, EOL wishes disposition and options.  A detailed discussion was had today regarding advanced directives.  Concepts specific to code status, artifical feeding and hydration, continued IV antibiotics and rehospitalization was had.  The difference between a aggressive medical intervention path  and a palliative comfort care path for this patient at this time was had.  Values and goals of care important to  patient and family were attempted to be elicited.  MOST form introduced  Concept of Hospice and Palliative Care were discussed  Natural trajectory and expectations at EOL were discussed.  Questions and concerns addressed.   Family encouraged to call with questions or concerns.  PMT will continue to support holistically.  Osvaldo AngstMary Mallicoat was able to clearly verbalize her understanding of the seriousness of her husband's medical conditions and the long-term poor prognosis.  She realizes that as a family they face life and death decisions.    She tells me that she believes her husband understands that his time is limited and is considering his options, and he has been expressing over the past many months to her that he does not believe that he can "go on like this".  Wife reports continued physical, functional and cognitive decline over the past year.  Patient and family face advanced directive decisions and anticipatory care needs.   HCPOA/ wife/Paiton Boultinghouse Grabill    SUMMARY OF RECOMMENDATIONS    Code Status/Advance Care Planning:  DNR   Palliative Prophylaxis:   Aspiration, Bowel Regimen, Frequent Pain Assessment and Oral Care  Additional Recommendations (Limitations, Scope, Preferences):  Full Scope Treatment-patient's wife is leaning towards a shift to a more comfort approach to care.  Will discuss with her daughter tonight and they plan to meet with this nurse  practitioner again in the morning at 10:00 to clarify goals of care.  Psycho-social/Spiritual:   Desire for further Chaplaincy support:no  Additional Recommendations: Education on Hospice  Prognosis:   Unable to determine- prognosis will depend on desire for life prolonging preventions, specifically continuation of dialysis  Discharge Planning: To Be Determined      Primary Diagnoses: Present on Admission: **None**   I have reviewed the medical record, interviewed the patient and family, and examined the patient.  The following aspects are pertinent.  Past Medical History:  Diagnosis Date  . Anemia   . Anxiety   . Arthritis   . B12 deficiency   . Coronary artery disease    a. 2vCABG LIMA-LAD, SVG-OM1 in 1999; b. Lexiscan myoview (5/10) showed EF 45%, HK apex & lateral wall, fixed perfusion defect involving apex, inferoapex, & inferolateral wall, also some inferoapical ischemia; LHC (5/10) showed patent LIMA-LAD and totally occluded SVG-OM1, 99% pLAD, 40% ostial CFX, 40% mCFX, 30% OM1, 20% pRCA  . Diabetes mellitus   . ESRD on hemodialysis (HCC)    a. once weekly as of 08/2015. Currently 3xweek since 09/11/15.  Marland Kitchen GERD (gastroesophageal reflux disease)   . History of MRI of chest 11/10   a. Cardiac; EF 48%, mid anterolateral and inferolateral walls, apical lateral wall, apical septal wall, and true apex were all think and akinetic; 51-75% thickness subendocardial delayed enhancement in mid anterolateral and inferolateral walls, full thickness enhancement in apical laterla and apical septal walls and true apex  . Hyperlipidemia   . Hypertension   . Ischemic cardiomyopathy    a. likely ICM, EF 55-60%, RWMA could not be excluded, nl PASP, nl CVP  . Nephrolithiasis   . Obesity   . Occasional tremors   . OSA (obstructive sleep apnea)    Moderate CPAP  . Shortness of breath dyspnea   . Spinal stenosis    Social History   Socioeconomic History  . Marital status: Married    Spouse name: None  . Number of children: 1  . Years of education: None  . Highest education level: None  Social Needs  . Financial resource strain: None  . Food insecurity - worry: None  . Food insecurity - inability: None  . Transportation needs - medical: None  . Transportation needs - non-medical: None  Occupational History  . Occupation: Retired  Tobacco Use  . Smoking status: Former Smoker    Types: Cigarettes  . Smokeless tobacco: Never Used  Substance and Sexual Activity  . Alcohol use: No  . Drug use: No  .  Sexual activity: None  Other Topics Concern  . None  Social History Narrative   Lives in [place].   Has 1 daughter in [place] and 3 grandchildren.   Daughter has a country music band.   Family History  Problem Relation Age of Onset  . Kidney failure Mother   . Heart failure Father    Scheduled Meds: . calcium acetate  2,001 mg Oral TID  . cinacalcet  30 mg Oral Daily  . epoetin (EPOGEN/PROCRIT) injection  4,000 Units Intravenous Q M,W,F-HD  . feeding supplement (NEPRO CARB STEADY)  237 mL Oral BID BM  . ferrous sulfate  325 mg Oral Daily  . furosemide  20 mg Intravenous Q12H  . heparin  5,000 Units Subcutaneous Q8H  . insulin aspart  0-9 Units Subcutaneous TID WC  . ipratropium-albuterol  3 mL Nebulization Q6H  . mouth rinse  15 mL Mouth Rinse q12n4p  .  megestrol  20 mg Oral Daily  . metoprolol succinate  12.5 mg Oral Daily  . midodrine  5 mg Oral BID WC  . multivitamin  1 tablet Oral QHS  . pravastatin  20 mg Oral q1800  . senna  1 tablet Oral BID  . sertraline  25 mg Oral Daily  . tamsulosin  0.4 mg Oral Daily  . [START ON 07/12/2017] tiotropium  1 capsule Inhalation Daily  . traZODone  50 mg Oral QHS  . vitamin C  500 mg Oral Daily   Continuous Infusions: . albumin human     PRN Meds:.acetaminophen **OR** acetaminophen, albuterol, ondansetron **OR** ondansetron (ZOFRAN) IV Medications Prior to Admission:  Prior to Admission medications   Medication Sig Start Date End Date Taking? Authorizing Provider  aspirin EC 81 MG tablet Take 81 mg by mouth daily.   Yes [provider]  B Complex-C-Folic Acid (VOL-CARE RX) 1 MG TABS Take 1 tablet by mouth every evening.  12/15/16  Yes [provider]  calcium acetate (PHOSLO) 667 MG capsule Take 2,001 mg by mouth 3 (three) times daily.  06/17/16  Yes [provider]  cinacalcet (SENSIPAR) 30 MG tablet Take 1 tablet by mouth daily. 12/17/15  Yes [provider]  ferrous sulfate 325 (65 FE) MG  tablet Take 325 mg by mouth daily.   Yes [provider]  Lancets (FREESTYLE) lancets Use 3 times a day to check blood sugar. 04/21/17  Yes Altamese Dilling, MD  lidocaine-prilocaine (EMLA) cream Apply 2.5 application topically as needed.   Yes [provider]  lovastatin (MEVACOR) 20 MG tablet Take 20 mg by mouth at bedtime.   Yes [provider]  megestrol (MEGACE) 20 MG tablet Take 20 mg by mouth daily.   Yes [provider]  metoprolol succinate (TOPROL XL) 25 MG 24 hr tablet Take 0.5 tablets (12.5 mg total) by mouth daily. 04/22/17  Yes Altamese Dilling, MD  nitroGLYCERIN (NITROSTAT) 0.4 MG SL tablet Place 1 tablet (0.4 mg total) under the tongue every 5 (five) minutes as needed for chest pain. 08/17/16 04/19/18 Yes Gollan, Tollie Pizza, MD  Nutritional Supplements (FEEDING SUPPLEMENT, NEPRO CARB STEADY,) LIQD Take 237 mLs by mouth daily. 04/21/17  Yes Altamese Dilling, MD  sertraline (ZOLOFT) 25 MG tablet Take 1 tablet by mouth daily.  12/07/16  Yes [provider]  SPIRIVA HANDIHALER 18 MCG inhalation capsule Place 1 capsule into inhaler and inhale daily. 05/11/16  Yes [provider]  tamsulosin (FLOMAX) 0.4 MG CAPS capsule Take 1 capsule (0.4 mg total) by mouth daily. 09/04/15  Yes Gouru, Deanna Artis, MD  torsemide (DEMADEX) 100 MG tablet Take 100 mg daily by mouth.   Yes [provider]  traZODone (DESYREL) 50 MG tablet Take 1 tablet by mouth at bedtime. 05/17/16  Yes [provider]   Allergies  Allergen Reactions  . Bee Venom   . Beef-Derived Products Hives  . Iodine Hives  . Ivp Dye [Iodinated Diagnostic Agents] Hives   Review of Systems  Unable to perform ROS: Mental status change    Physical Exam  Constitutional: He appears ill.  Cardiovascular: Tachycardia present.  Musculoskeletal:  generalized weakness  Skin: Skin is warm and dry.    Vital Signs: BP (!) 90/43 (BP Location: Right Arm)   Pulse  100   Temp 98.1 F (36.7 C) (Oral)   Resp 18   Ht 5\' 6"  (1.676 m)   Wt 72.1 kg (159 lb)   SpO2 94%  BMI 25.66 kg/m  Pain Assessment: 0-10   Pain Score: 0-No pain   SpO2: SpO2: 94 % O2 Device:SpO2: 94 % O2 Flow Rate: .O2 Flow Rate (L/min): 7 L/min  IO: Intake/output summary:   Intake/Output Summary (Last 24 hours) at 07/11/2017 0934 Last data filed at 07/11/2017 0020 Gross per 24 hour  Intake 60 ml  Output 1066 ml  Net -1006 ml    LBM: Last BM Date: 07/08/17 Baseline Weight: Weight: 72.6 kg (160 lb) Most recent weight: Weight: 72.1 kg (159 lb)     Palliative Assessment/Data:  30%    Discussed with Dr Nemiah CommanderKalisetti  Time In: 0930 Time Out: 1045 Time Total: 75 minutes Greater than 50%  of this time was spent counseling and coordinating care related to the above assessment and plan.  Signed by: Lorinda CreedMary Krishana Lutze, NP   Please contact Palliative Medicine Team phone at [phone] for questions and concerns.  For individual provider: See Loretha StaplerAmion

## 2017-07-11 NOTE — Progress Notes (Signed)
[facility], [place] 07/11/17  Subjective:  Patient seen and evaluated during hemodialysis today. Palliative care representatives also met with the patient's wife today. They are considering cessation of dialysis.    Objective:  Vital signs in last 24 hours:  Temp:  [98.1 F (36.7 C)-98.9 F (37.2 C)] 98.4 F (36.9 C) (11/20 0947) Pulse Rate:  [88-100] 92 (11/20 1130) Resp:  [14-28] 27 (11/20 1130) BP: (80-115)/(43-72) 97/51 (11/20 1130) SpO2:  [90 %-100 %] 100 % (11/20 1130) FiO2 (%):  [40 %] 40 % (11/20 0714) Weight:  [72.1 kg (158 lb 15.2 oz)-74.3 kg (163 lb 12.8 oz)] 72.1 kg (158 lb 15.2 oz) (11/20 0947)  Weight change:  Filed Weights   07/10/17 1634 07/11/17 0647 07/11/17 0947  Weight: 73.6 kg (162 lb 4.1 oz) 72.1 kg (159 lb) 72.1 kg (158 lb 15.2 oz)    Intake/Output:    Intake/Output Summary (Last 24 hours) at 07/11/2017 1151 Last data filed at 07/11/2017 0020 Gross per 24 hour  Intake 60 ml  Output 1066 ml  Net -1006 ml     Physical Exam: General:  elderly male  HEENT  [place]/AT hearing intact  Neck  supple  Pulm/lungs  scattered rhonchi bilatearl  CVS/Heart  S1S2 no rubs  Abdomen:   Soft, nontender, nondistended  Extremities:  1+ LE edema  Neurologic:  Awake, but confused at times  Skin:  No acute rashes          Basic Metabolic Panel:  Recent Labs  Lab 07/07/17 1617 07/08/17 0446 07/09/17 0429 07/11/17 0538 07/11/17 0918  NA 137 138 137 137  --   K 3.4* 5.1 4.4 4.5  --   CL 94* 96* 97* 100*  --   CO2 32 34* 31 31  --   GLUCOSE 155* 186* 137* 111*  --   BUN 11 20 33* 30*  --   CREATININE 1.81* 2.59* 2.88* 3.20*  --   CALCIUM 8.5* 8.1* 8.8* 8.7*  --   MG  --  2.1  --   --   --   PHOS  --  4.9*  --   --  2.9     CBC: Recent Labs  Lab 07/07/17 1750 07/08/17 0446 07/09/17 0429  WBC 6.5 4.7 6.4  NEUTROABS 5.6  --   --   HGB 10.3* 9.6* 9.1*  HCT 32.9* 31.9* 30.0*  MCV 94.9 96.0 95.8  PLT 298 331 383       Lab Results  Component Value Date   HEPBSAG Negative 07/10/2017   HEPBSAB Non Reactive 09/01/2015      Microbiology:  Recent Results (from the past 240 hour(s))  Microscopic Examination     Status: Abnormal   Collection Time: 07/04/17  4:14 PM  Result Value Ref Range Status   WBC, UA 0-5 0 - 5 /hpf Final   RBC, UA >30 (A) 0 - 2 /hpf Final   Epithelial Cells (non renal) 0-10 0 - 10 /hpf Final   Renal Epithel, UA 0-10 None seen /hpf Final   Casts Present (A) None seen /lpf Final   Cast Type Hyaline casts N/A Final   Mucus, UA Present (A) Not Estab. Final   Bacteria, UA Moderate (A) None seen/Few Final  MRSA PCR Screening     Status: None   Collection Time: 07/07/17 11:22 PM  Result Value Ref Range Status   MRSA by PCR NEGATIVE NEGATIVE Final    Comment:  The GeneXpert MRSA Assay (FDA approved for NASAL specimens only), is one component of a comprehensive MRSA colonization surveillance program. It is not intended to diagnose MRSA infection nor to guide or monitor treatment for MRSA infections.   Body fluid culture     Status: None (Preliminary result)   Collection Time: 07/09/17  1:55 PM  Result Value Ref Range Status   Specimen Description PLEURAL  Final   Special Requests NONE  Final   Gram Stain NO WBC SEEN NO ORGANISMS SEEN   Final   Culture   Final    NO GROWTH 2 DAYS Performed at [facility], [address], [place], [place] [address]    Report Status PENDING  Incomplete    Coagulation Studies: No results for input(s): LABPROT, INR in the last 72 hours.  Urinalysis: No results for input(s): COLORURINE, LABSPEC, PHURINE, GLUCOSEU, HGBUR, BILIRUBINUR, KETONESUR, PROTEINUR, UROBILINOGEN, NITRITE, LEUKOCYTESUR in the last 72 hours.  Invalid input(s): APPERANCEUR    Imaging: Dg Chest Port 1 View  Result Date: 07/09/2017 CLINICAL DATA:  Status post left thoracentesis EXAM: PORTABLE CHEST 1 VIEW COMPARISON:  07/09/2017 FINDINGS:  Cardiac shadow is stable. Postoperative changes are again seen. Stable right pleural effusion is noted with basilar atelectasis. There is been interval left thoracentesis with decrease in the degree of pleural effusion. Persistent infiltrative density is noted in the base. No pneumothorax is seen. No bony abnormality is noted. IMPRESSION: No evidence of post thoracentesis pneumothorax. Reduction in left pleural effusion is noted. Electronically Signed   By: Inez Catalina M.D.   On: 07/09/2017 14:23   US Thoracentesis Asp Pleural Space W/img Guide  Result Date: 07/09/2017 INDICATION: Shortness of breath. Bilateral pleural effusions, left greater than right. EXAM: ULTRASOUND GUIDED LEFT THORACENTESIS MEDICATIONS: None indicated PROCEDURE: An ultrasound guided thoracentesis was thoroughly discussed with the patient and questions answered. The benefits, risks, alternatives and complications were also discussed. The patient understands and wishes to proceed with the procedure. Written consent was obtained. Ultrasound was performed to localize and mark an adequate pocket of fluid in the left chest. The area was then prepped and draped in the normal sterile fashion. 1% Lidocaine was used for local anesthesia. Under ultrasound guidance a Safe-T-Centesis catheter was introduced. Thoracentesis was performed. The catheter was removed and a dressing applied. FINDINGS: A total of approximately 1 L of blood-tinged fluid was removed. Samples were sent to the laboratory as requested by the clinical team. Aspiration was terminated when the patient described pain associated with the procedure, and his symptoms resolved. The patient tolerated the procedure well. COMPLICATIONS: None immediate. IMPRESSION: Successful ultrasound guided left thoracentesis yielding 1 L of pleural fluid. Follow-up chest radiograph shows no pneumothorax. Electronically Signed   By: Lucrezia Europe M.D.   On: 07/09/2017 15:00     Medications:   . albumin  human     . calcium acetate  2,001 mg Oral TID  . cinacalcet  30 mg Oral Daily  . epoetin (EPOGEN/PROCRIT) injection  4,000 Units Intravenous Q M,W,F-HD  . feeding supplement (NEPRO CARB STEADY)  237 mL Oral BID BM  . ferrous sulfate  325 mg Oral Daily  . furosemide  20 mg Intravenous Q12H  . heparin  5,000 Units Subcutaneous Q8H  . insulin aspart  0-9 Units Subcutaneous TID WC  . ipratropium-albuterol  3 mL Nebulization Q6H  . mouth rinse  15 mL Mouth Rinse q12n4p  . megestrol  20 mg Oral Daily  . metoprolol succinate  12.5  mg Oral Daily  . midodrine  5 mg Oral BID WC  . multivitamin  1 tablet Oral QHS  . pravastatin  20 mg Oral q1800  . senna  1 tablet Oral BID  . sertraline  25 mg Oral Daily  . tamsulosin  0.4 mg Oral Daily  . [START ON 07/12/2017] tiotropium  1 capsule Inhalation Daily  . traZODone  50 mg Oral QHS  . vitamin C  500 mg Oral Daily   acetaminophen **OR** acetaminophen, albuterol, ondansetron **OR** ondansetron (ZOFRAN) IV  Assessment/ Plan:  83 y.o. male with end-stage kidney disease, dialysis,diabetes type 2, coronary disease, CABG, anemia of chronic kidney disease, anxiety disorder, GERD, recurrent pleural effusions s/p thoracentesis .  [facility]. MWF-1. [facility]/Dr Kolluru  1.  ESRD on HD MWF 2.  Acute shortness of breath from pulmonary edema with bilateral pleural effusions.   3.  Anemia of chronic kidney disease 4.  Secondary hyperparathyroidism  Plan: Patient seen and evaluated during hemodialysis today.  He appears to be tolerating this well.  Overall however the patient has had a slow steady decline in his physical condition.  Palliative care met with the patient's wife today.  She would like to discuss the case with her husband as well as her daughter.  Therefore palate of care will meet with the family again tomorrow.  Further plan as patient progresses.   LOS: [place], Giannis Corpuz 11/20/201811:51 [place], [place]

## 2017-07-11 NOTE — Progress Notes (Signed)
HD initiated via L AVF without issue. No heparin tx. Phos sent to lab as ordered

## 2017-07-11 NOTE — Progress Notes (Signed)
[place] [facility] [place] NAME: Kenneth [name]    MR#:  [id number]  DATE OF BIRTH:  1928-07-21  SUBJECTIVE:  CHIEF COMPLAINT:   Chief Complaint  Patient presents with  . Shortness of Breath  . Chest Pain   - On Ventimask, FiO2 40% today. Had dialysis again today for fluid overload. -Wife met with palliative care RN today and considering possible hospice. -Patient appears confused and has mild cognitive decline  REVIEW OF SYSTEMS:  Review of Systems  Constitutional: Positive for malaise/fatigue. Negative for chills and fever.  HENT: Negative for congestion, ear discharge, hearing loss and nosebleeds.   Eyes: Negative for blurred vision and double vision.  Respiratory: Positive for shortness of breath. Negative for cough and wheezing.   Cardiovascular: Negative for chest pain, palpitations and leg swelling.  Gastrointestinal: Negative for abdominal pain, constipation, diarrhea, nausea and vomiting.  Genitourinary: Negative for dysuria.  Musculoskeletal: Negative for myalgias.  Neurological: Negative for dizziness, sensory change, speech change, focal weakness, seizures and headaches.  Psychiatric/Behavioral: Negative for depression.    DRUG ALLERGIES:   Allergies  Allergen Reactions  . Bee Venom   . Beef-Derived Products Hives  . Iodine Hives  . Ivp Dye [Iodinated Diagnostic Agents] Hives    VITALS:  Blood pressure (!) 90/46, pulse (!) 50, temperature 98 F (36.7 C), temperature source Axillary, resp. rate (!) 24, height '5\' 6"'$  (1.676 m), weight 72.1 kg (158 lb 15.2 oz), SpO2 (!) 86 %.  PHYSICAL EXAMINATION:  Physical Exam  GENERAL:  83 y.o.-year-old patient lying in the bed with no acute distress.  EYES: Pupils equal, round, reactive to light and accommodation. No scleral icterus. Extraocular muscles intact.  HEENT: Head atraumatic, normocephalic. Oropharynx and nasopharynx clear. On Bipap NECK:  Supple, no jugular venous  distention. No thyroid enlargement, no tenderness.  LUNGS: Normal breath sounds bilaterally, no wheezing, rales,rhonchi or crepitation. No use of accessory muscles of respiration. Decreased bibasilar breath sounds with occasional crackles CARDIOVASCULAR: S1, S2 normal. No  rubs, or gallops. 2/6 systolic murmur present  ABDOMEN: Soft, nontender, nondistended. Bowel sounds present. No organomegaly or mass.  EXTREMITIES: No  cyanosis, or clubbing. 1+ pedal edema noted Left upper arm AV fistula with good thrill present NEUROLOGIC: Cranial nerves II through XII are intact. Muscle strength 5/5 in all extremities. Sensation intact. Gait not checked. Global weakness noted.  PSYCHIATRIC: The patient is alert and oriented x 2-3   SKIN: No obvious rash, lesion, or ulcer.    LABORATORY PANEL:   CBC Recent Labs  Lab 07/09/17 0429  WBC 6.4  HGB 9.1*  HCT 30.0*  PLT 383   ------------------------------------------------------------------------------------------------------------------  Chemistries  Recent Labs  Lab 07/07/17 1617 07/08/17 0446  07/11/17 0538  NA 137 138   < > 137  K 3.4* 5.1   < > 4.5  CL 94* 96*   < > 100*  CO2 32 34*   < > 31  GLUCOSE 155* 186*   < > 111*  BUN 11 20   < > 30*  CREATININE 1.81* 2.59*   < > 3.20*  CALCIUM 8.5* 8.1*   < > 8.7*  MG  --  2.1  --   --   AST 21  --   --   --   ALT 7*  --   --   --   ALKPHOS 76  --   --   --   BILITOT 0.7  --   --   --    < > =  values in this interval not displayed.   ------------------------------------------------------------------------------------------------------------------  Cardiac Enzymes Recent Labs  Lab 07/07/17 1617  TROPONINI 0.06*   ------------------------------------------------------------------------------------------------------------------  RADIOLOGY:  No results found.  EKG:   Orders placed or performed during the hospital encounter of 07/07/17  . ED EKG  . ED EKG    ASSESSMENT AND  PLAN:   Kenneth [name]  is a 83 y.o. male with a known history of sepsis, coronary artery disease status post CABG, hypertension, COPD on chronic 2 L nasal cannula oxygen, recurrent pleural effusion from congestive heart failure with recurrent thoracentesis last thoracentesis done was May 30, 2017 950 cc of fluid was removed comes to the emergency room after dialysis when he started having increasing shortness of breath.  1.  Acute on chronic hypoxic respiratory failure secondary to acute on chronic diastolic congestive heart failure/pulmonary edema -Was on BiPAP initially and then Ventimask. Status post left-sided thoracentesis on 07/09/2017 and 1 L taken out. -Remains on Ventimask at 40% FiO2 -Has repeat episodes of pulmonary edema and pleural effusion worsening-transudate in all cases. Discussed with nephrology about doing aggressive dialysis to help with the fluid overload. Not sure patient will be able to tolerate that. -Patient had dialysis yesterday and also today and feels exhausted. --PRN breathing treatments -Repeat echo again with diastolic dysfunction and EF of 55%.   2.  Bilateral pleural effusion with recurrent thoracentesis -Left-sided thoracentesis done in 1 L taken out this admission.  -Needing recurrent thoracentesis and steady decline in his clinical health. Palliative care consulted.  3.  End-stage renal disease on hemodialysis -Had progressively worsening CK D, has been on dialysis for almost 2 years now. -Nephrology consulted. -Regularly on a Monday, Wednesday and Friday schedule -Dialysis done yesterday per schedule an extra session today for fluid overload  4.  Hypotension-started on midodrine  5.  Diabetes sliding scale insulin and home meds  6.  DVT prophylaxis subcu heparin  Physical therapy consulted. At baseline has a walker but only ambulate a few steps due to chronic dyspnea -Palliative care consulted for goals of treatment as patient's overall  clinical condition has been declining lately -Wife considering hospice. Family meeting tomorrow  Discussed with wife at bedside   All the records are reviewed and case discussed with Care Management/Social Workerr. Management plans discussed with the patient, family and they are in agreement.  CODE STATUS: DNR  TOTAL TIME TAKING CARE OF THIS PATIENT: 34 minutes.   POSSIBLE D/C IN 1-2 DAYS, DEPENDING ON CLINICAL CONDITION.   Gladstone Lighter M.D on 07/11/2017 at 3:13 PM  Between 7am to 6pm - Pager - [phone]  After 6pm go to [email or web address] - password [facility]  Office  [phone]  CC: Primary care physician; Tracie Harrier, MD

## 2017-07-11 NOTE — Progress Notes (Signed)
HD completed without issue. Patient tolerated well. Report called to primary RN

## 2017-07-11 NOTE — Progress Notes (Signed)
Patient has been on Venturi mask at 40% during the night. He has taken it off several times. Nurse stayed in room with him to monitor a great deal during the night to prevent removal of mask..Oxygen level drops then rebounds(83% to mid 90s) when replaced. Patient reports he feels tired this morning. He did sleep during the night. This morning he is oriented to self, knows his birthday , nodded when asked if he knows where he is but did not say hospital. Swallowed medication with water without difficulty last night. Foley removed last night- pt did not void. Bladder scanned this am - 47ml.

## 2017-07-11 NOTE — Care Management Important Message (Signed)
Important Message  Patient Details  Name: Marykay LexHollan T Righter MRN: [id number] Date of Birth: 07-30-1928   Medicare Important Message Given:  Yes    Gwenette GreetBrenda S Otho Michalik, RN 07/11/2017, 11:23 AM

## 2017-07-12 DIAGNOSIS — I509 Heart failure, unspecified: Secondary | ICD-10-CM

## 2017-07-12 LAB — BASIC METABOLIC PANEL
Anion gap: 8 (ref 5–15)
BUN: 31 mg/dL — ABNORMAL HIGH (ref 6–20)
CALCIUM: 8.2 mg/dL — AB (ref 8.9–10.3)
CO2: 31 mmol/L (ref 22–32)
CREATININE: 2.9 mg/dL — AB (ref 0.61–1.24)
Chloride: 98 mmol/L — ABNORMAL LOW (ref 101–111)
GFR, EST AFRICAN AMERICAN: 21 mL/min — AB (ref >60)
GFR, EST NON AFRICAN AMERICAN: 18 mL/min — AB (ref >60)
Glucose, Bld: 135 mg/dL — ABNORMAL HIGH (ref 65–99)
Potassium: 3.4 mmol/L — ABNORMAL LOW (ref 3.5–5.1)
SODIUM: 137 mmol/L (ref 135–145)

## 2017-07-12 LAB — GLUCOSE, CAPILLARY
GLUCOSE-CAPILLARY: 129 mg/dL — AB (ref 65–99)
GLUCOSE-CAPILLARY: 237 mg/dL — AB (ref 65–99)
GLUCOSE-CAPILLARY: 143 mg/dL — AB (ref 65–99)
Glucose-Capillary: 160 mg/dL — ABNORMAL HIGH (ref 65–99)

## 2017-07-12 LAB — CBC
HEMATOCRIT: 29.7 % — AB (ref 40.0–52.0)
Hemoglobin: 9.2 g/dL — ABNORMAL LOW (ref 13.0–18.0)
MCH: 29.8 pg (ref 26.0–34.0)
MCHC: 30.9 g/dL — ABNORMAL LOW (ref 32.0–36.0)
MCV: 96.4 fL (ref 80.0–100.0)
Platelets: 366 10*3/uL (ref 150–440)
RBC: 3.08 MIL/uL — ABNORMAL LOW (ref 4.40–5.90)
RDW: 21.3 % — AB (ref 11.5–14.5)
WBC: 5.4 10*3/uL (ref 3.8–10.6)

## 2017-07-12 MED ORDER — PHENOL 1.4 % MT LIQD
1.0000 | OROMUCOSAL | Status: DC | PRN
  Filled 2017-07-12: qty 177

## 2017-07-12 MED ORDER — IPRATROPIUM-ALBUTEROL 0.5-2.5 (3) MG/3ML IN SOLN: 3.0000 mL | RESPIRATORY_TRACT | Status: DC | PRN

## 2017-07-12 NOTE — Progress Notes (Signed)
Per MD okay for RN to hold BP medications this morning. Pt will be going to dialysis.

## 2017-07-12 NOTE — Progress Notes (Signed)
[employer] [facility] - [place] [facility]   PATIENT NAME: Kenneth [name]    MR#:  [id number]  DATE OF BIRTH:  1927/11/10  SUBJECTIVE:  CHIEF COMPLAINT:   Chief Complaint  Patient presents with  . Shortness of Breath  . Chest Pain   - Patient seen during dialysis today. More clear. Not on Ventimask and on 4 L oxygen. -Family discussed with palliative care, plan is to continue dialysis at this time  REVIEW OF SYSTEMS:  Review of Systems  Constitutional: Positive for malaise/fatigue. Negative for chills and fever.  HENT: Negative for congestion, ear discharge, hearing loss and nosebleeds.   Eyes: Negative for blurred vision and double vision.  Respiratory: Positive for shortness of breath. Negative for cough and wheezing.   Cardiovascular: Negative for chest pain, palpitations and leg swelling.  Gastrointestinal: Negative for abdominal pain, constipation, diarrhea, nausea and vomiting.  Genitourinary: Negative for dysuria.  Musculoskeletal: Negative for myalgias.  Neurological: Negative for dizziness, sensory change, speech change, focal weakness, seizures and headaches.  Psychiatric/Behavioral: Negative for depression.    DRUG ALLERGIES:   Allergies  Allergen Reactions  . Bee Venom   . Beef-Derived Products Hives  . Iodine Hives  . Ivp Dye [Iodinated Diagnostic Agents] Hives    VITALS:  Blood pressure (!) 109/91, pulse (P) 100, temperature 98.9 F (37.2 C), temperature source Oral, resp. rate (!) 21, height 5\' 6"  (1.676 m), weight 67.3 kg (148 lb 5.9 oz), SpO2 95 %.  PHYSICAL EXAMINATION:  Physical Exam  GENERAL:  83 y.o.-year-old patient lying in the bed with no acute distress.  EYES: Pupils equal, round, reactive to light and accommodation. No scleral icterus. Extraocular muscles intact.  HEENT: Head atraumatic, normocephalic. Oropharynx and nasopharynx clear. On Bipap NECK:  Supple, no jugular venous distention. No thyroid enlargement, no tenderness.    LUNGS: Normal breath sounds bilaterally, no wheezing, rales,rhonchi or crepitation. No use of accessory muscles of respiration. Decreased bibasilar breath sounds with occasional crackles CARDIOVASCULAR: S1, S2 normal. No  rubs, or gallops. 2/6 systolic murmur present  ABDOMEN: Soft, nontender, nondistended. Bowel sounds present. No organomegaly or mass.  EXTREMITIES: No  cyanosis, or clubbing. 1+ pedal edema noted Left upper arm AV fistula with good thrill present NEUROLOGIC: Cranial nerves II through XII are intact. Muscle strength 5/5 in all extremities. Sensation intact. Gait not checked. Global weakness noted.  PSYCHIATRIC: The patient is alert and oriented x 2-3   SKIN: No obvious rash, lesion, or ulcer.    LABORATORY PANEL:   CBC Recent Labs  Lab 07/12/17 0532  WBC 5.4  HGB 9.2*  HCT 29.7*  PLT 366   ------------------------------------------------------------------------------------------------------------------  Chemistries  Recent Labs  Lab 07/07/17 1617 07/08/17 0446  07/12/17 0532  NA 137 138   < > 137  K 3.4* 5.1   < > 3.4*  CL 94* 96*   < > 98*  CO2 32 34*   < > 31  GLUCOSE 155* 186*   < > 135*  BUN 11 20   < > 31*  CREATININE 1.81* 2.59*   < > 2.90*  CALCIUM 8.5* 8.1*   < > 8.2*  MG  --  2.1  --   --   AST 21  --   --   --   ALT 7*  --   --   --   ALKPHOS 76  --   --   --   BILITOT 0.7  --   --   --    < > =  values in this interval not displayed.   ------------------------------------------------------------------------------------------------------------------  Cardiac Enzymes Recent Labs  Lab 07/07/17 1617  TROPONINI 0.06*   ------------------------------------------------------------------------------------------------------------------  RADIOLOGY:  No results found.  EKG:   Orders placed or performed during the hospital encounter of 07/07/17  . ED EKG  . ED EKG    ASSESSMENT AND PLAN:   Kenneth [name]  is a 83 y.o. male with a  known history of sepsis, coronary artery disease status post CABG, hypertension, COPD on chronic 2 L nasal cannula oxygen, recurrent pleural effusion from congestive heart failure with recurrent thoracentesis last thoracentesis done was May 30, 2017 950 cc of fluid was removed comes to the emergency room after dialysis when he started having increasing shortness of breath.  1.  Acute on chronic hypoxic respiratory failure secondary to acute on chronic diastolic congestive heart failure/pulmonary edema -Was on BiPAP initially and then Ventimask. Status post left-sided thoracentesis on 07/09/2017 and 1 L taken out. -Currently on 4 L oxygen, on chronic 2-3 L at home -Has repeat episodes of pulmonary edema and pleural effusion and multiple admissions for the same requiring thoracentesis. -Echocardiogram with mild diastolic dysfunction with EF is 55% his fluid overload is most likely due to unable to take off lot of fluid during dialysis. -Discussed with nephrology, patient has been having aggressive dialysis for the last 3 days in a row. On Monday Wednesday Friday schedule usually. -That seems to be helping him. Continue aggressive dialysis as outpatient as well --PRN breathing treatments -Repeat echo again with diastolic dysfunction and EF of 55%.   2.  Bilateral pleural effusion with recurrent thoracentesis -Left-sided thoracentesis done in 1 L taken out this admission.  -Needing recurrent thoracentesis and steady decline in his clinical health.  -On dialysis aggressively with improvement in dyspnea  3.  End-stage renal disease on hemodialysis -Had progressively worsening CK D, has been on dialysis for almost 2 years now. -Nephrology consulted. -Regularly on a Monday, Wednesday and Friday schedule -Dialysis done for the last 3 days for fluid overload. Symptoms are improving. Next dialysis is on Friday per schedule  4.  Hypotension-started on midodrine  5.  Diabetes sliding scale  insulin and home meds  6.  DVT prophylaxis subcu heparin  Physical therapy consulted. At baseline has a walker but only ambulate a few steps due to chronic dyspnea -Palliative care consulted for goals of treatment as patient's overall clinical condition has been declining lately -Initially wife wanted hospice, however patient wants to continue dialysis at this time. -Plan is to discharge to rehabilitation once respiratory wise is stable with palliative care follow-up     All the records are reviewed and case discussed with Care Management/Social Workerr. Management plans discussed with the patient, family and they are in agreement.  CODE STATUS: DNR  TOTAL TIME TAKING CARE OF THIS PATIENT: 35 minutes.   POSSIBLE D/C IN 1-2 DAYS, DEPENDING ON CLINICAL CONDITION.   Enid BaasKALISETTI,Pj Zehner M.D on 07/12/2017 at 3:36 PM  Between 7am to 6pm - Pager - [phone]  After 6pm go to [email or web address] - Social research officer, governmentpassword [email or web address]  [facility]  Office  [phone]  CC: Primary care physician; Barbette ReichmannHande, Vishwanath, MD

## 2017-07-12 NOTE — Progress Notes (Signed)
HD TX ended  

## 2017-07-12 NOTE — Progress Notes (Signed)
HD TX started  

## 2017-07-12 NOTE — Progress Notes (Signed)
PT Cancellation Note  Patient Details Name: Kenneth [name] MRN: [id number] DOB: October 17, 1927   Cancelled Treatment:    Reason Eval/Treat Not Completed: (Per chart review, patient/family considering cessation of dialysis and transition to hospice care.  Scheduled for family meeting this PM.  Will hold services at this time and re-attempt after family meeting pending clarification of goals of care.)   Charlyne Robertshaw H. Manson PasseyBrown, PT, DPT, NCS 07/12/17, 10:40 AM [phone]

## 2017-07-12 NOTE — Progress Notes (Signed)
PT Cancellation Note  Patient Details Name: Kenneth [name] MRN: [id number] DOB: 03-Sep-1927   Cancelled Treatment:    Reason Eval/Treat Not Completed: Patient at procedure or test/unavailable(Per notes, patient/family planning to continue with dialysis and current POC.  Now off unit for dialysis; will continue efforts at later time/date as medically appropriate and available.)   [name] H. Manson PasseyBrown, PT, DPT, NCS 07/12/17, 3:03 PM [phone]

## 2017-07-12 NOTE — Progress Notes (Signed)
[facility] [place], [place] 07/12/17  Subjective:  Patient seen at bedside. Family was at bedside as well as palliative care. It appears that the patient and family are favoring continuing dialysis at this point in time.  Objective:  Vital signs in last 24 hours:  Temp:  [98 F (36.7 C)-98.5 F (36.9 C)] 98.1 F (36.7 C) (11/21 0421) Pulse Rate:  [50-104] 88 (11/21 0826) Resp:  [16-25] 24 (11/20 1345) BP: (90-180)/(44-133) 108/50 (11/21 0826) SpO2:  [86 %-100 %] 95 % (11/21 0812) FiO2 (%):  [40 %-55 %] 55 % (11/21 0812) Weight:  [67.6 kg (149 lb 1.6 oz)] 67.6 kg (149 lb 1.6 oz) (11/21 0430)  Weight change: -2.2 kg (-13.6 oz) Filed Weights   07/11/17 0647 07/11/17 0947 07/12/17 0430  Weight: 72.1 kg (159 lb) 72.1 kg (158 lb 15.2 oz) 67.6 kg (149 lb 1.6 oz)    Intake/Output:    Intake/Output Summary (Last 24 hours) at 07/12/2017 1152 Last data filed at 07/12/2017 1019 Gross per 24 hour  Intake 480 ml  Output 1387 ml  Net -907 ml     Physical Exam: General:  elderly male  HEENT  [place]/AT hearing intact  Neck  supple  Pulm/lungs  scattered rhonchi bilateral  CVS/Heart  S1S2 no rubs  Abdomen:   Soft, nontender, nondistended  Extremities:  trace LE edema  Neurologic:  Awake, alert, oriented x 3 today  Skin:  No acute rashes          Basic Metabolic Panel:  Recent Labs  Lab 07/07/17 1617 07/08/17 0446 07/09/17 0429 07/11/17 0538 07/11/17 0918 07/12/17 0532  NA 137 138 137 137  --  137  K 3.4* 5.1 4.4 4.5  --  3.4*  CL 94* 96* 97* 100*  --  98*  CO2 32 34* 31 31  --  31  GLUCOSE 155* 186* 137* 111*  --  135*  BUN 11 20 33* 30*  --  31*  CREATININE 1.81* 2.59* 2.88* 3.20*  --  2.90*  CALCIUM 8.5* 8.1* 8.8* 8.7*  --  8.2*  MG  --  2.1  --   --   --   --   PHOS  --  4.9*  --   --  2.9  --      CBC: Recent Labs  Lab 07/07/17 1750 07/08/17 0446 07/09/17 0429 07/12/17 0532  WBC 6.5 4.7 6.4 5.4  NEUTROABS 5.6  --   --   --   HGB  10.3* 9.6* 9.1* 9.2*  HCT 32.9* 31.9* 30.0* 29.7*  MCV 94.9 96.0 95.8 96.4  PLT 298 331 383 366      Lab Results  Component Value Date   HEPBSAG Negative 07/10/2017   HEPBSAB Non Reactive 09/01/2015      Microbiology:  Recent Results (from the past 240 hour(s))  Microscopic Examination     Status: Abnormal   Collection Time: 07/04/17  4:14 PM  Result Value Ref Range Status   WBC, UA 0-5 0 - 5 /hpf Final   RBC, UA >30 (A) 0 - 2 /hpf Final   Epithelial Cells (non renal) 0-10 0 - 10 /hpf Final   Renal Epithel, UA 0-10 None seen /hpf Final   Casts Present (A) None seen /lpf Final   Cast Type Hyaline casts N/A Final   Mucus, UA Present (A) Not Estab. Final   Bacteria, UA Moderate (A) None seen/Few Final  MRSA PCR Screening     Status: None  Collection Time: 07/07/17 11:22 PM  Result Value Ref Range Status   MRSA by PCR NEGATIVE NEGATIVE Final    Comment:        The GeneXpert MRSA Assay (FDA approved for NASAL specimens only), is one component of a comprehensive MRSA colonization surveillance program. It is not intended to diagnose MRSA infection nor to guide or monitor treatment for MRSA infections.   Body fluid culture     Status: None (Preliminary result)   Collection Time: 07/09/17  1:55 PM  Result Value Ref Range Status   Specimen Description PLEURAL  Final   Special Requests NONE  Final   Gram Stain NO WBC SEEN NO ORGANISMS SEEN   Final   Culture   Final    NO GROWTH 3 DAYS Performed at [facility], [address] [place], [place] [address]    Report Status PENDING  Incomplete    Coagulation Studies: No results for input(s): LABPROT, INR in the last 72 hours.  Urinalysis: No results for input(s): COLORURINE, LABSPEC, PHURINE, GLUCOSEU, HGBUR, BILIRUBINUR, KETONESUR, PROTEINUR, UROBILINOGEN, NITRITE, LEUKOCYTESUR in the last 72 hours.  Invalid input(s): APPERANCEUR    Imaging: No results found.   Medications:   . albumin human     .  calcium acetate  2,001 mg Oral TID  . cinacalcet  30 mg Oral Daily  . epoetin (EPOGEN/PROCRIT) injection  4,000 Units Intravenous Q M,W,F-HD  . feeding supplement (NEPRO CARB STEADY)  237 mL Oral BID BM  . ferrous sulfate  325 mg Oral Daily  . furosemide  20 mg Intravenous Q12H  . heparin  5,000 Units Subcutaneous Q8H  . insulin aspart  0-9 Units Subcutaneous TID WC  . mouth rinse  15 mL Mouth Rinse q12n4p  . megestrol  20 mg Oral Daily  . metoprolol succinate  12.5 mg Oral Daily  . midodrine  5 mg Oral BID WC  . multivitamin  1 tablet Oral QHS  . pravastatin  20 mg Oral q1800  . senna  1 tablet Oral BID  . sertraline  25 mg Oral Daily  . tamsulosin  0.4 mg Oral Daily  . tiotropium  1 capsule Inhalation Daily  . traZODone  50 mg Oral QHS  . vitamin C  500 mg Oral Daily   acetaminophen **OR** acetaminophen, ipratropium-albuterol, ondansetron **OR** ondansetron (ZOFRAN) IV, phenol  Assessment/ Plan:  83 y.o. male with end-stage kidney disease, dialysis,diabetes type 2, coronary disease, CABG, anemia of chronic kidney disease, anxiety disorder, GERD, recurrent pleural effusions s/p thoracentesis .  [facility]. MWF-1. [facility]/Dr Kolluru  1.  ESRD on HD MWF 2.  Acute shortness of breath from pulmonary edema with bilateral pleural effusions.   3.  Anemia of chronic kidney disease 4.  Secondary hyperparathyroidism  Plan:  Patient seen and evaluated at bedside today.  His family was at bedside as well as palliative care team.  Patient decided that he would like to continue renal replacement therapy at this point in time.  It appears that he has benefited from frequent dialysis treatments this week.  His mental status is greatly improved this week and he is breathing comfortably at the moment.  Therefore we will plan for another dialysis session today.  Thereafter his next dialysis treatment will be on Friday.   LOS: 5 Finis Hendricksen 11/21/201811:52 AM  [address]  [facility] [place], [place] [phone]

## 2017-07-12 NOTE — Clinical Social Work Note (Signed)
[name] has noted that PT has recommended SNF however, Palliative Care has been consulted to discuss goals of care and potential hospice consideration. York SpanielMonica Miral Hoopes MSW,LCSW [phone]

## 2017-07-12 NOTE — Progress Notes (Signed)
Pre hd 

## 2017-07-12 NOTE — Progress Notes (Signed)
Post hd 

## 2017-07-12 NOTE — Progress Notes (Signed)
Patient ID: Kenneth [name], male   DOB: [birth year], 83 y.o.   MRN: [id number]  This NP visited patient at the bedside as a follow up to  yesterday's GOCs meeting.  Wife, daughter and two grandchildren at the bedside.  Requested Dr Benito MccreedyLeteef for conversation with family, he updated them from a renal perspective.  I stayed with family and continued conversation reagrding diagnosis, prognsosi, GOC, EOL wishes disposition and options.  We discussed various scenarios  related to his need for dialysis and the wife's adamant decline of SNF for any kind of medical care.       We detailed the concept of adult failure to thrive and  human mortality.   Most importantly we discussed values and goals of care important to [name].  Although all family members verbalize an understanding of of long term poor prognosis, they see [name] as doing well today and cannot at this time make decision for de-escalation of care.  Decision is to have dialysis today and again on Friday and if stable discharge home.  Family will attempt to continue out-patient dialysis.  If this is not possible or if patient  is rehospitalization they will consider stopping dialysis at that time and access a hospice facility.  Discussed with family the importance of continued conversation with both family members  and their  medical providers regarding overall plan of care and treatment options,  ensuring decisions are within the context of the patients values and GOCs.  Questions and concerns addressed   This NP will f/u on Friday morning   Time in 1000          Time out 1045    Total time spent on the unit was  45 minutes   Greater than 50% of the time was spent in counseling and coordination of care  Lorinda CreedMary Kaisy Severino NP  Palliative Medicine Team Team Phone # [phone] Pager [phone]

## 2017-07-12 NOTE — Progress Notes (Signed)
PRE HD ASSESSMENT 

## 2017-07-13 LAB — GLUCOSE, CAPILLARY
Glucose-Capillary: 145 mg/dL — ABNORMAL HIGH (ref 65–99)
Glucose-Capillary: 140 mg/dL — ABNORMAL HIGH (ref 65–99)
Glucose-Capillary: 191 mg/dL — ABNORMAL HIGH (ref 65–99)
Glucose-Capillary: 104 mg/dL — ABNORMAL HIGH (ref 65–99)

## 2017-07-13 LAB — BASIC METABOLIC PANEL
ANION GAP: 8 (ref 5–15)
BUN: 31 mg/dL — AB (ref 6–20)
CALCIUM: 9 mg/dL (ref 8.9–10.3)
CO2: 30 mmol/L (ref 22–32)
CREATININE: 3.11 mg/dL — AB (ref 0.61–1.24)
Chloride: 99 mmol/L — ABNORMAL LOW (ref 101–111)
GFR calc Af Amer: 19 mL/min — ABNORMAL LOW (ref >60)
GFR, EST NON AFRICAN AMERICAN: 17 mL/min — AB (ref >60)
GLUCOSE: 167 mg/dL — AB (ref 65–99)
Potassium: 3.8 mmol/L (ref 3.5–5.1)
Sodium: 137 mmol/L (ref 135–145)

## 2017-07-13 LAB — BODY FLUID CULTURE
Culture: NO GROWTH
Gram Stain: NONE SEEN

## 2017-07-13 NOTE — Progress Notes (Signed)
[facility] - [place] at [facility]   PATIENT NAME: Kenneth [name]    MR#:  [id number]  DATE OF BIRTH:  1927/08/24  SUBJECTIVE:  CHIEF COMPLAINT:   Chief Complaint  Patient presents with  . Shortness of Breath  . Chest Pain   - Patient appears More clear.  on Ventimask  -Family discussed with palliative care, plan is to continue dialysis at this time  REVIEW OF SYSTEMS:  Review of Systems  Constitutional: Positive for malaise/fatigue. Negative for chills and fever.  HENT: Negative for congestion, ear discharge, hearing loss and nosebleeds.   Eyes: Negative for blurred vision and double vision.  Respiratory: Positive for shortness of breath. Negative for cough and wheezing.   Cardiovascular: Negative for chest pain, palpitations and leg swelling.  Gastrointestinal: Negative for abdominal pain, constipation, diarrhea, nausea and vomiting.  Genitourinary: Negative for dysuria.  Musculoskeletal: Negative for myalgias.  Neurological: Negative for dizziness, sensory change, speech change, focal weakness, seizures and headaches.  Psychiatric/Behavioral: Negative for depression.    DRUG ALLERGIES:   Allergies  Allergen Reactions  . Bee Venom   . Beef-Derived Products Hives  . Iodine Hives  . Ivp Dye [Iodinated Diagnostic Agents] Hives    VITALS:  Blood pressure (!) 123/56, pulse 98, temperature 98 F (36.7 C), temperature source Oral, resp. rate 20, height 5\' 6"  (1.676 m), weight 69.5 kg (153 lb 3.2 oz), SpO2 95 %.  PHYSICAL EXAMINATION:  Physical Exam  GENERAL:  83 y.o.-year-old patient lying in the bed with no acute distress.  EYES: Pupils equal, round, reactive to light and accommodation. No scleral icterus. Extraocular muscles intact.  HEENT: Head atraumatic, normocephalic. Oropharynx and nasopharynx clear. On Bipap NECK:  Supple, no jugular venous distention. No thyroid enlargement, no tenderness.  LUNGS: Normal breath sounds bilaterally, no  wheezing, rales,rhonchi or crepitation. No use of accessory muscles of respiration. Decreased bibasilar breath sounds with occasional crackles CARDIOVASCULAR: S1, S2 normal. No  rubs, or gallops. 2/6 systolic murmur present  ABDOMEN: Soft, nontender, nondistended. Bowel sounds present. No organomegaly or mass.  EXTREMITIES: No  cyanosis, or clubbing. 1+ pedal edema noted Left upper arm AV fistula with good thrill present NEUROLOGIC: Cranial nerves II through XII are intact. Muscle strength 5/5 in all extremities. Sensation intact. Gait not checked. Global weakness noted.  PSYCHIATRIC: The patient is alert and oriented x 2-3   SKIN: No obvious rash, lesion, or ulcer.    LABORATORY PANEL:   CBC Recent Labs  Lab 07/12/17 0532  WBC 5.4  HGB 9.2*  HCT 29.7*  PLT 366   ------------------------------------------------------------------------------------------------------------------  Chemistries  Recent Labs  Lab 07/07/17 1617 07/08/17 0446  07/13/17 0535  NA 137 138   < > 137  K 3.4* 5.1   < > 3.8  CL 94* 96*   < > 99*  CO2 32 34*   < > 30  GLUCOSE 155* 186*   < > 167*  BUN 11 20   < > 31*  CREATININE 1.81* 2.59*   < > 3.11*  CALCIUM 8.5* 8.1*   < > 9.0  MG  --  2.1  --   --   AST 21  --   --   --   ALT 7*  --   --   --   ALKPHOS 76  --   --   --   BILITOT 0.7  --   --   --    < > = values in this interval  not displayed.   ------------------------------------------------------------------------------------------------------------------  Cardiac Enzymes Recent Labs  Lab 07/07/17 1617  TROPONINI 0.06*   ------------------------------------------------------------------------------------------------------------------  RADIOLOGY:  No results found.  EKG:   Orders placed or performed during the hospital encounter of 07/07/17  . ED EKG  . ED EKG    ASSESSMENT AND PLAN:   Kenneth [name]  is a 83 y.o. male with a known history of sepsis, coronary artery disease  status post CABG, hypertension, COPD on chronic 2 L nasal cannula oxygen, recurrent pleural effusion from congestive heart failure with recurrent thoracentesis last thoracentesis done was May 30, 2017 950 cc of fluid was removed comes to the emergency room after dialysis when he started having increasing shortness of breath.  1.  Acute on chronic hypoxic respiratory failure secondary to acute on chronic diastolic congestive heart failure/pulmonary edema -Was on BiPAP initially and then Ventimask. Status post left-sided thoracentesis on 07/09/2017 and 1 L taken out. -Currently on ventimask oxygen, on chronic 2-3 L at home -pt Has repeat episodes of pulmonary edema and pleural effusion and multiple admissions for the same requiring thoracentesis. -Echocardiogram with mild diastolic dysfunction with EF is 55% his fluid overload is most likely due to unable to take off lot of fluid during dialysis. -Discussed with nephrology, patient has been having aggressive dialysis for the last 3 days in a row. On Monday Wednesday Friday schedule usually. -That seems to be helping him. Continue aggressive dialysis as outpatient as well --PRN breathing treatments -Repeat echo again with diastolic dysfunction and EF of 55%.   2.  Bilateral pleural effusion with recurrent thoracentesis -Left-sided thoracentesis done in 1 L taken out this admission.  -Needing recurrent thoracentesis and steady decline in his clinical health.  -On dialysis aggressively with improvement in dyspnea  3.  End-stage renal disease on hemodialysis -Had progressively worsening CK D, has been on dialysis for almost 2 years now. -Nephrology consulted. -Regularly on a Monday, Wednesday and Friday schedule -Dialysis done for the last 3 days for fluid overload. Symptoms are improving. Next dialysis is on Friday per schedule  4.  Hypotension-started on midodrine  5.  Diabetes sliding scale insulin and home meds  6.  DVT prophylaxis  subcu heparin  Physical therapy consulted. At baseline has a walker but only ambulate a few steps due to chronic dyspnea -Palliative care consulted for goals of treatment as patient's overall clinical condition has been declining lately -Initially wife wanted hospice, however patient wants to continue dialysis at this time. -Plan is to discharge to rehabilitation once respiratory wise is stable with palliative care follow-up   D/w dter at length    All the records are reviewed and case discussed with Care Management/Social Workerr. Management plans discussed with the patient, family and they are in agreement.  CODE STATUS: DNR  TOTAL TIME TAKING CARE OF THIS PATIENT: 30 minutes.   POSSIBLE D/C IN 1-2 DAYS, DEPENDING ON CLINICAL CONDITION.   Enedina FinnerSona Laparis Durrett M.D on 07/13/2017 at 12:47 PM  Between 7am to 6pm - Pager - [phone]  After 6pm go to [email or web address] - Social research officer, governmentpassword EPAS [facility]  [facility]  Office  [phone]  CC: Primary care physician; Barbette ReichmannHande, Vishwanath, MD

## 2017-07-13 NOTE — Plan of Care (Signed)
Patient continues to require frequent reassurance.  RN discussed plan of care with niece, Ms. Drue SecondSnider, who verbalized understanding.  No questions at this time

## 2017-07-13 NOTE — Progress Notes (Signed)
[facility] [place], [place] 07/13/17  Subjective:  Patient a bit more lethargic today. He did have hemodialysis yesterday. Currently on oxygen facemask.  Objective:  Vital signs in last 24 hours:  Temp:  [98 F (36.7 C)-98.9 F (37.2 C)] 98 F (36.7 C) (11/22 0525) Pulse Rate:  [88-116] 98 (11/22 1033) Resp:  [18-32] 20 (11/22 0525) BP: (73-142)/(43-128) 123/56 (11/22 1033) SpO2:  [92 %-100 %] 95 % (11/22 0603) Weight:  [67.3 kg (148 lb 5.9 oz)-69.5 kg (153 lb 3.2 oz)] 69.5 kg (153 lb 3.2 oz) (11/21 1639)  Weight change: -4.8 kg (-9.3 oz) Filed Weights   07/12/17 0430 07/12/17 1350 07/12/17 1639  Weight: 67.6 kg (149 lb 1.6 oz) 67.3 kg (148 lb 5.9 oz) 69.5 kg (153 lb 3.2 oz)    Intake/Output:    Intake/Output Summary (Last 24 hours) at 07/13/2017 1037 Last data filed at 07/13/2017 0726 Gross per 24 hour  Intake 240 ml  Output 500 ml  Net -260 ml     Physical Exam: General:  elderly male  HEENT  [place]/AT O2 facemaks on  Neck  supple  Pulm/lungs  scattered rhonchi bilateral, normal effort  CVS/Heart  S1S2 no rubs  Abdomen:   Soft, nontender, nondistended  Extremities:  trace LE edema  Neurologic:  Awake, alert, oriented x 3 today  Skin:  No acute rashes          Basic Metabolic Panel:  Recent Labs  Lab 07/08/17 0446 07/09/17 0429 07/11/17 0538 07/11/17 0918 07/12/17 0532 07/13/17 0535  NA 138 137 137  --  137 137  K 5.1 4.4 4.5  --  3.4* 3.8  CL 96* 97* 100*  --  98* 99*  CO2 34* 31 31  --  31 30  GLUCOSE 186* 137* 111*  --  135* 167*  BUN 20 33* 30*  --  31* 31*  CREATININE 2.59* 2.88* 3.20*  --  2.90* 3.11*  CALCIUM 8.1* 8.8* 8.7*  --  8.2* 9.0  MG 2.1  --   --   --   --   --   PHOS 4.9*  --   --  2.9  --   --      CBC: Recent Labs  Lab 07/07/17 1750 07/08/17 0446 07/09/17 0429 07/12/17 0532  WBC 6.5 4.7 6.4 5.4  NEUTROABS 5.6  --   --   --   HGB 10.3* 9.6* 9.1* 9.2*  HCT 32.9* 31.9* 30.0* 29.7*  MCV 94.9 96.0  95.8 96.4  PLT 298 331 383 366      Lab Results  Component Value Date   HEPBSAG Negative 07/10/2017   HEPBSAB Non Reactive 09/01/2015      Microbiology:  Recent Results (from the past 240 hour(s))  Microscopic Examination     Status: Abnormal   Collection Time: 07/04/17  4:14 PM  Result Value Ref Range Status   WBC, UA 0-5 0 - 5 /hpf Final   RBC, UA >30 (A) 0 - 2 /hpf Final   Epithelial Cells (non renal) 0-10 0 - 10 /hpf Final   Renal Epithel, UA 0-10 None seen /hpf Final   Casts Present (A) None seen /lpf Final   Cast Type Hyaline casts N/A Final   Mucus, UA Present (A) Not Estab. Final   Bacteria, UA Moderate (A) None seen/Few Final  MRSA PCR Screening     Status: None   Collection Time: 07/07/17 11:22 PM  Result Value Ref Range Status  MRSA by PCR NEGATIVE NEGATIVE Final    Comment:        The GeneXpert MRSA Assay (FDA approved for NASAL specimens only), is one component of a comprehensive MRSA colonization surveillance program. It is not intended to diagnose MRSA infection nor to guide or monitor treatment for MRSA infections.   Body fluid culture     Status: None (Preliminary result)   Collection Time: 07/09/17  1:55 PM  Result Value Ref Range Status   Specimen Description PLEURAL  Final   Special Requests NONE  Final   Gram Stain NO WBC SEEN NO ORGANISMS SEEN   Final   Culture   Final    NO GROWTH 3 DAYS Performed at [facility], [address] [place], [place] [address]    Report Status PENDING  Incomplete    Coagulation Studies: No results for input(s): LABPROT, INR in the last 72 hours.  Urinalysis: No results for input(s): COLORURINE, LABSPEC, PHURINE, GLUCOSEU, HGBUR, BILIRUBINUR, KETONESUR, PROTEINUR, UROBILINOGEN, NITRITE, LEUKOCYTESUR in the last 72 hours.  Invalid input(s): APPERANCEUR    Imaging: No results found.   Medications:   . albumin human     . calcium acetate  2,001 mg Oral TID  . cinacalcet  30 mg Oral Daily   . epoetin (EPOGEN/PROCRIT) injection  4,000 Units Intravenous Q M,W,F-HD  . feeding supplement (NEPRO CARB STEADY)  237 mL Oral BID BM  . ferrous sulfate  325 mg Oral Daily  . furosemide  20 mg Intravenous Q12H  . heparin  5,000 Units Subcutaneous Q8H  . insulin aspart  0-9 Units Subcutaneous TID WC  . mouth rinse  15 mL Mouth Rinse q12n4p  . megestrol  20 mg Oral Daily  . metoprolol succinate  12.5 mg Oral Daily  . midodrine  5 mg Oral BID WC  . multivitamin  1 tablet Oral QHS  . pravastatin  20 mg Oral q1800  . senna  1 tablet Oral BID  . sertraline  25 mg Oral Daily  . tamsulosin  0.4 mg Oral Daily  . tiotropium  1 capsule Inhalation Daily  . traZODone  50 mg Oral QHS  . vitamin C  500 mg Oral Daily   acetaminophen **OR** acetaminophen, ipratropium-albuterol, ondansetron **OR** ondansetron (ZOFRAN) IV, phenol  Assessment/ Plan:  83 y.o. male with end-stage kidney disease, dialysis,diabetes type 2, coronary disease, CABG, anemia of chronic kidney disease, anxiety disorder, GERD, recurrent pleural effusions s/p thoracentesis .  [facility]. MWF-1. [facility]/Dr Kolluru  1.  ESRD on HD MWF 2.  Acute shortness of breath from pulmonary edema with bilateral pleural effusions.   3.  Anemia of chronic kidney disease 4.  Secondary hyperparathyroidism  Plan:  Patient completed hemodialysis yesterday.  No urgent indication for dialysis today.  We will plan for dialysis treatment again tomorrow.  Hemoglobin currently stable at 9.2.  Continue Epogen 4000 units IV with dialysis.  We will also continue to monitor bone mineral metabolism parameters.  Patient will be maintained on calcium acetate 3 tablets p.o. 3 times daily with meals.   LOS: 6 Cherylyn Sundby 11/22/201810:37 AM  [address] [facility] [place], [place] [phone]

## 2017-07-13 NOTE — Progress Notes (Signed)
Patient continues to remove leads and pulse oximetry monitor,  RN repositioned, adjusted patient and encouraged several deep inhalations, patient's saturation went up to 94% on 4L/[place]

## 2017-07-13 NOTE — Progress Notes (Signed)
Per MD order pt was switch from his venti mask to nasal canula on 4 L of oxygen. Pt Oxygen started to drop to low 80's. Notified MD and put pt back on Venti Mask. Pt O2 pick up back to 96 %. Will continue to assess and monitor pt.

## 2017-07-14 DIAGNOSIS — J9611 Chronic respiratory failure with hypoxia: Secondary | ICD-10-CM

## 2017-07-14 LAB — CBC
HEMATOCRIT: 31.2 % — AB (ref 40.0–52.0)
Hemoglobin: 9.7 g/dL — ABNORMAL LOW (ref 13.0–18.0)
MCH: 29.5 pg (ref 26.0–34.0)
MCHC: 31 g/dL — AB (ref 32.0–36.0)
MCV: 95.3 fL (ref 80.0–100.0)
Platelets: 375 10*3/uL (ref 150–440)
RBC: 3.28 MIL/uL — ABNORMAL LOW (ref 4.40–5.90)
RDW: 21.1 % — AB (ref 11.5–14.5)
WBC: 7.8 10*3/uL (ref 3.8–10.6)

## 2017-07-14 LAB — PHOSPHORUS: Phosphorus: 1.9 mg/dL — ABNORMAL LOW (ref 2.5–4.6)

## 2017-07-14 LAB — GLUCOSE, CAPILLARY
GLUCOSE-CAPILLARY: 136 mg/dL — AB (ref 65–99)
Glucose-Capillary: 146 mg/dL — ABNORMAL HIGH (ref 65–99)

## 2017-07-14 MED ORDER — TORSEMIDE 100 MG PO TABS
100.0000 mg | ORAL_TABLET | Freq: Every day | ORAL | Status: DC
  Administered 2017-07-14: 100 mg via ORAL
  Filled 2017-07-14: qty 1

## 2017-07-14 MED ORDER — MIDODRINE HCL 5 MG PO TABS: 5.0000 mg | ORAL_TABLET | Freq: Two times a day (BID) | ORAL | 0 refills | Status: AC

## 2017-07-14 MED ORDER — ASCORBIC ACID 500 MG PO TABS: 500.0000 mg | ORAL_TABLET | Freq: Every day | ORAL | 0 refills | Status: AC

## 2017-07-14 MED ORDER — IPRATROPIUM-ALBUTEROL 0.5-2.5 (3) MG/3ML IN SOLN: 3.0000 mL | RESPIRATORY_TRACT | 0 refills | Status: AC | PRN

## 2017-07-14 NOTE — Progress Notes (Signed)
tx started 

## 2017-07-14 NOTE — Clinical Social Work Note (Addendum)
4:00pm: Dr. Sherryll BurgerShah has spoken at length with patient's wife and she is in agreement now to have patient transported to [facility] this afternoon.  3:30pm: Patient was suppose to discharge this afternoon to a facility. [facility] MD completed discharge and spoke with patient's wife. Patient's wife has now stated she wants patient to stay over night even though she realizes that insurance may not pay due to patient not being medically needing to remain in the hospital.  York SpanielMonica Minha Fulco MSW,LCSW [phone]

## 2017-07-14 NOTE — Discharge Summary (Signed)
[facility] - [place] at [facility]   PATIENT NAME: Kenneth [name]    MR#:  [id number]  DATE OF BIRTH:  04-08-1928  DATE OF ADMISSION:  07/07/2017 ADMITTING PHYSICIAN: Enedina Finner, MD  DATE OF DISCHARGE: 07/14/2017  PRIMARY CARE PHYSICIAN: Barbette Reichmann, MD    ADMISSION DIAGNOSIS:  Hypoxia [R09.02] COPD exacerbation (HCC) [J44.1] Congestive heart failure, unspecified HF chronicity, unspecified heart failure type (HCC) [I50.9] CHF (congestive heart failure) (HCC) [I50.9]  DISCHARGE DIAGNOSIS:  1.  Acute on chronic hypoxic respiratory failure secondary to acute on chronic diastolic congestive and pulmonary edema with recurrent pleural effusion status post thoracentesis on the left side on 07/09/2017 2.  Bilateral pleural effusion with recurrent thoracentesis 3.  End-stage renal disease on hemodialysis 4.  Relative hypotension SECONDARY DIAGNOSIS:   Past Medical History:  Diagnosis Date  . Anemia   . Anxiety   . Arthritis   . B12 deficiency   . Coronary artery disease    a. 2vCABG LIMA-LAD, SVG-OM1 in 1999; b. Lexiscan myoview (5/10) showed EF 45%, HK apex & lateral wall, fixed perfusion defect involving apex, inferoapex, & inferolateral wall, also some inferoapical ischemia; LHC (5/10) showed patent LIMA-LAD and totally occluded SVG-OM1, 99% pLAD, 40% ostial CFX, 40% mCFX, 30% OM1, 20% pRCA  . Diabetes mellitus   . ESRD on hemodialysis (HCC)    a. once weekly as of 08/2015. Currently 3xweek since 09/11/15.  Marland Kitchen GERD (gastroesophageal reflux disease)   . History of MRI of chest 11/10   a. Cardiac; EF 48%, mid anterolateral and inferolateral walls, apical lateral wall, apical septal wall, and true apex were all think and akinetic; 51-75% thickness subendocardial delayed enhancement in mid anterolateral and inferolateral walls, full thickness enhancement in apical laterla and apical septal walls and true apex  . Hyperlipidemia   . Hypertension   .  Ischemic cardiomyopathy    a. likely ICM, EF 55-60%, RWMA could not be excluded, nl PASP, nl CVP  . Nephrolithiasis   . Obesity   . Occasional tremors   . OSA (obstructive sleep apnea)    Moderate CPAP  . Shortness of breath dyspnea   . Spinal stenosis     HOSPITAL COURSE:   HollanPickardis a83 y.o.malewith a known history of sepsis, coronary artery disease status post CABG, hypertension, COPD on chronic 2 L nasal cannula oxygen, recurrent pleural effusion from congestive heart failure with recurrent thoracentesis last thoracentesis done was May 30, 2017 950 cc of fluid was removed comes to the emergency room after dialysis when he started having increasing shortness of breath.  1. Acute on chronic hypoxic respiratory failure secondary to acute on chronic diastolic congestive heart failure/pulmonary edema -Was on BiPAP initially and then Ventimask. Status post left-sided thoracentesis on 07/09/2017 and 1 L taken out. -Currently on [place] 3.5 liters and sats 100% this morning oxygen, on chronic 2-3 L at home -pt Has repeat episodes of pulmonary edema and pleural effusion and multiple admissions for the same requiring thoracentesis. -Echocardiogram with mild diastolic dysfunction with EF is 55% -Discussed with nephrology, patient has been having aggressive dialysis in the hospital with UF  -That seems to be helping him. Continue aggressive dialysis as outpatient as well --PRN breathing treatments  2. Bilateral pleural effusion with recurrent thoracentesis -Left-sided thoracentesis done in 1 L taken out this admission.  -Needing recurrent thoracentesis and steady decline in his clinical health.  -On dialysis aggressively with improvement in dyspnea  3. End-stage renal disease on hemodialysis -Had progressively worsening  CK D, has been on dialysis for almost 2 years now. -Nephrology consult -Regularly on a Monday, Wednesday and Friday schedule -Dialysis done for the last 3 days  for fluid overload. Symptoms are improving. Next dialysis is on Friday per schedule  4. Hypotension-started on midodrine  5. Diabetes sliding scale insulin and home meds  6. DVT prophylaxis subcu heparin  Physical therapy consulted. At baseline has a walker but only ambulate a few steps due to chronic dyspnea -Palliative care consulted for goals of treatment as patient's overall clinical condition has been declining lately -Initially wife wanted hospice, however patient wants to continue dialysis at this time. -Plan is to discharge to rehabilitation today.  Patient was able to sit in the chair during dialysis. -Was discussed with patient's wife Mrs. Tanya Nonesickard.  She will discussed with her daughter and decide which facility.   -Discussed with Dr. Lourdes SledgeLatif agrees with plan.   CONSULTS OBTAINED:  Treatment Team:  Tora Kindredonforti, John, DO Mosetta PigeonSingh, Harmeet, MD  DRUG ALLERGIES:   Allergies  Allergen Reactions  . Bee Venom   . Beef-Derived Products Hives  . Iodine Hives  . Ivp Dye [Iodinated Diagnostic Agents] Hives    DISCHARGE MEDICATIONS:   Current Discharge Medication List    START taking these medications   Details  ipratropium-albuterol (DUONEB) 0.5-2.5 (3) MG/3ML SOLN Take 3 mLs by nebulization every 4 (four) hours as needed. Qty: 360 mL, Refills: 0    midodrine (PROAMATINE) 5 MG tablet Take 1 tablet (5 mg total) by mouth 2 (two) times daily with a meal. Qty: 60 tablet, Refills: 0    vitamin C (VITAMIN C) 500 MG tablet Take 1 tablet (500 mg total) by mouth daily. Qty: 30 tablet, Refills: 0      CONTINUE these medications which have NOT CHANGED   Details  aspirin EC 81 MG tablet Take 81 mg by mouth daily.    B Complex-C-Folic Acid (VOL-CARE RX) 1 MG TABS Take 1 tablet by mouth every evening.  Refills: 5    calcium acetate (PHOSLO) 667 MG capsule Take 2,001 mg by mouth 3 (three) times daily.     cinacalcet (SENSIPAR) 30 MG tablet Take 1 tablet by mouth daily.     ferrous sulfate 325 (65 FE) MG tablet Take 325 mg by mouth daily.    Lancets (FREESTYLE) lancets Use 3 times a day to check blood sugar. Qty: 60 each, Refills: 5    lidocaine-prilocaine (EMLA) cream Apply 2.5 application topically as needed.    lovastatin (MEVACOR) 20 MG tablet Take 20 mg by mouth at bedtime.    megestrol (MEGACE) 20 MG tablet Take 20 mg by mouth daily.    metoprolol succinate (TOPROL XL) 25 MG 24 hr tablet Take 0.5 tablets (12.5 mg total) by mouth daily. Qty: 30 tablet, Refills: 0    nitroGLYCERIN (NITROSTAT) 0.4 MG SL tablet Place 1 tablet (0.4 mg total) under the tongue every 5 (five) minutes as needed for chest pain. Qty: 25 tablet, Refills: 3    Nutritional Supplements (FEEDING SUPPLEMENT, NEPRO CARB STEADY,) LIQD Take 237 mLs by mouth daily. Qty: 3 Can, Refills: 0    sertraline (ZOLOFT) 25 MG tablet Take 1 tablet by mouth daily.     SPIRIVA HANDIHALER 18 MCG inhalation capsule Place 1 capsule into inhaler and inhale daily.    tamsulosin (FLOMAX) 0.4 MG CAPS capsule Take 1 capsule (0.4 mg total) by mouth daily. Qty: 30 capsule, Refills: 0    torsemide (DEMADEX) 100 MG tablet Take 100  mg daily by mouth.    traZODone (DESYREL) 50 MG tablet Take 1 tablet by mouth at bedtime.        If you experience worsening of your admission symptoms, develop shortness of breath, life threatening emergency, suicidal or homicidal thoughts you must seek medical attention immediately by calling 911 or calling your MD immediately  if symptoms less severe.  You Must read complete instructions/literature along with all the possible adverse reactions/side effects for all the Medicines you take and that have been prescribed to you. Take any new Medicines after you have completely understood and accept all the possible adverse reactions/side effects.   Please note  You were cared for by a hospitalist during your hospital stay. If you have any questions about your discharge  medications or the care you received while you were in the hospital after you are discharged, you can call the unit and asked to speak with the hospitalist on call if the hospitalist that took care of you is not available. Once you are discharged, your primary care physician will handle any further medical issues. Please note that NO REFILLS for any discharge medications will be authorized once you are discharged, as it is imperative that you return to your primary care physician (or establish a relationship with a primary care physician if you do not have one) for your aftercare needs so that they can reassess your need for medications and monitor your lab values. Today   SUBJECTIVE   No new complaints  VITAL SIGNS:  Blood pressure 111/71, pulse 95, temperature 98.6 F (37 C), temperature source Oral, resp. rate (!) 24, height 5\' 6"  (1.676 m), weight 70 kg (154 lb 5.2 oz), SpO2 100 %.  I/O:    Intake/Output Summary (Last 24 hours) at 07/14/2017 1407 Last data filed at 07/14/2017 0900 Gross per 24 hour  Intake 0 ml  Output 75 ml  Net -75 ml    PHYSICAL EXAMINATION:  GENERAL:  83 y.o.-year-old patient lying in the bed with no acute distress.  EYES: Pupils equal, round, reactive to light and accommodation. No scleral icterus. Extraocular muscles intact.  HEENT: Head atraumatic, normocephalic. Oropharynx and nasopharynx clear.  NECK:  Supple, no jugular venous distention. No thyroid enlargement, no tenderness.  LUNGS: deCreased breath sounds bilaterally, no wheezing, rales,rhonchi or crepitation. No use of accessory muscles of respiration.  CARDIOVASCULAR: S1, S2 normal. No murmurs, rubs, or gallops.  ABDOMEN: Soft, non-tender, non-distended. Bowel sounds present. No organomegaly or mass.  EXTREMITIES: No pedal edema, cyanosis, or clubbing.  NEUROLOGIC: Cranial nerves II through XII are intact. Muscle strength 5/5 in all extremities. Sensation intact. Gait not checked.  PSYCHIATRIC: The  patient is alert and oriented x 3.  SKIN: No obvious rash, lesion, or ulcer.   DATA REVIEW:   CBC  Recent Labs  Lab 07/14/17 1030  WBC 7.8  HGB 9.7*  HCT 31.2*  PLT 375    Chemistries  Recent Labs  Lab 07/07/17 1617 07/08/17 0446  07/13/17 0535  NA 137 138   < > 137  K 3.4* 5.1   < > 3.8  CL 94* 96*   < > 99*  CO2 32 34*   < > 30  GLUCOSE 155* 186*   < > 167*  BUN 11 20   < > 31*  CREATININE 1.81* 2.59*   < > 3.11*  CALCIUM 8.5* 8.1*   < > 9.0  MG  --  2.1  --   --  AST 21  --   --   --   ALT 7*  --   --   --   ALKPHOS 76  --   --   --   BILITOT 0.7  --   --   --    < > = values in this interval not displayed.    Microbiology Results   Recent Results (from the past 240 hour(s))  Microscopic Examination     Status: Abnormal   Collection Time: 07/04/17  4:14 PM  Result Value Ref Range Status   WBC, UA 0-5 0 - 5 /hpf Final   RBC, UA >30 (A) 0 - 2 /hpf Final   Epithelial Cells (non renal) 0-10 0 - 10 /hpf Final   Renal Epithel, UA 0-10 None seen /hpf Final   Casts Present (A) None seen /lpf Final   Cast Type Hyaline casts N/A Final   Mucus, UA Present (A) Not Estab. Final   Bacteria, UA Moderate (A) None seen/Few Final  MRSA PCR Screening     Status: None   Collection Time: 07/07/17 11:22 PM  Result Value Ref Range Status   MRSA by PCR NEGATIVE NEGATIVE Final    Comment:        The GeneXpert MRSA Assay (FDA approved for NASAL specimens only), is one component of a comprehensive MRSA colonization surveillance program. It is not intended to diagnose MRSA infection nor to guide or monitor treatment for MRSA infections.   Body fluid culture     Status: None   Collection Time: 07/09/17  1:55 PM  Result Value Ref Range Status   Specimen Description PLEURAL  Final   Special Requests NONE  Final   Gram Stain NO WBC SEEN NO ORGANISMS SEEN   Final   Culture   Final    NO GROWTH 3 DAYS Performed at [facility], [address] [place], [place]  [address]    Report Status 07/13/2017 FINAL  Final    RADIOLOGY:  No results found.   Management plans discussed with the patient, family and they are in agreement.  CODE STATUS:     Code Status Orders  (From admission, onward)        Start     Ordered   07/07/17 2324  Do not attempt resuscitation (DNR)  Continuous    Question Answer Comment  In the event of cardiac or respiratory ARREST Do not call a "code blue"   In the event of cardiac or respiratory ARREST Do not perform Intubation, CPR, defibrillation or ACLS   In the event of cardiac or respiratory ARREST Use medication by any route, position, wound care, and other measures to relive pain and suffering. May use oxygen, suction and manual treatment of airway obstruction as needed for comfort.      07/07/17 2323    Code Status History    Date Active Date Inactive Code Status Order ID Comments User Context   04/19/2017 17:08 04/21/2017 18:26 DNR [id number]  Enedina Finner, MD Inpatient   07/15/2016 00:59 07/15/2016 17:50 Full Code [id number]  Tonye Royalty, DO Inpatient   03/22/2016 15:31 03/23/2016 12:54 Full Code [id number]  Renford Dills, MD Inpatient   08/24/2015 16:21 09/04/2015 20:59 Full Code [id number]  Enedina Finner, MD Inpatient      TOTAL TIME TAKING CARE OF THIS PATIENT: *40 minutes.    Enedina Finner M.D on 07/14/2017 at 2:07 PM  Between 7am to 6pm - Pager - [phone] After 6pm go  to [email or web address] - Social research officer, governmentpassword [email or web address]  [facility]  Office  [phone]  CC: Primary care physician; Barbette ReichmannHande, Vishwanath, MD

## 2017-07-14 NOTE — Clinical Social Work Note (Signed)
Clinical Social Work Assessment  Patient Details  Name: Kenneth [name] MRN: [id number] Date of Birth: 1927/09/05  Date of referral:  07/14/17               Reason for consult:  Discharge Planning                Permission sought to share information with:    Permission granted to share information::     Name::        Agency::     Relationship::     Contact Information:     Housing/Transportation Living arrangements for the past 2 months:  Single Family Home Source of Information:  Spouse Patient Interpreter Needed:  None Criminal Activity/Legal Involvement Pertinent to Current Situation/Hospitalization:  No - Comment as needed Significant Relationships:  Spouse Lives with:  Spouse Do you feel safe going back to the place where you live?  Yes Need for family participation in patient care:  Yes (Comment)  Care giving concerns:  Patient resides at home with his spouse.   Social Worker assessment / plan:  MD stated patient might be ready for discharge today and is on 4 liters oxygen and no longer on venti mask. [name] spoke with patient's wife in patient's room (patient was in dialysis) and explained role and purpose of visit. Patient's wife is in agreement with STR and states patient is also, but they have very particular criteria. Patient's wife states that they do not want [facility], [facility], [facility] for definite. She also stated that she does not know about [facility] or [facility] and states she wants [facility] or [facility]. [name] explained that a bed search would be conducted with all facilities in [facility] and to have a plan B in the event she does not get bed offers from the facilities they want.   Employment status:  Retired Database administratornsurance information:  Managed Medicare PT Recommendations:  Skilled Nursing Facility Information / Referral to community resources:     Patient/Family's Response to care:  Patient's wife expressed appreciation for [name]  assistance.  Patient/Family's Understanding of and Emotional Response to Diagnosis, Current Treatment, and Prognosis:  Patient's wife is very particular about which facility which narrows their options.   Emotional Assessment Appearance:    Attitude/Demeanor/Rapport:    Affect (typically observed):    Orientation:  Oriented to Self, Oriented to Place, Oriented to  Time, Oriented to Situation Alcohol / Substance use:  Not Applicable Psych involvement (Current and /or in the community):  No (Comment)  Discharge Needs  Concerns to be addressed:  Care Coordination Readmission within the last 30 days:  No Current discharge risk:  None Barriers to Discharge:  No Barriers Identified   York SpanielMonica Bettina Warn, LCSW 07/14/2017, 10:39 AM

## 2017-07-14 NOTE — Progress Notes (Signed)
Patient ID: Marykay LexHollan T Salser, male   DOB: 25-Mar-1928, 83 y.o.   MRN: [id number]  This NP visited patient at the bedside as a follow up for her palliative needs and emotional support, wife at bedside.  Continued onversation regarding goals of care and disposition options.  Wife tells me plan is to disposition to skilled nursing facility and continue outpatient dialysis as long as she can tolerate with some level of quality of life.  Discussed with patient the importance of continued conversation with family and their  medical providers regarding overall plan of care and treatment options,  ensuring decisions are within the context of the patients values and GOCs.  Questions and concerns addressed    Discussed with Dr. Allena KatzPatel   PMT continue to support holistically.  Time in 1130          Time out 1145      Total time spen on the unit was 15 minutes  Greater than 50% of the time was spent in counseling and coordination of care  Lorinda CreedMary Shontay Wallner NP  Palliative Medicine Team Team Phone # [phone] Pager [phone]

## 2017-07-14 NOTE — Progress Notes (Signed)
Pre HD  

## 2017-07-14 NOTE — Progress Notes (Signed)
Pt d/c to [facility] on 4 L [place]. Report called to facility.  IV removed intact. EMS called for transport.

## 2017-07-14 NOTE — Progress Notes (Signed)
PT Cancellation Note  Patient Details Name: Kenneth [name] MRN: [id number] DOB: [birth year]   Cancelled Treatment:    Reason Eval/Treat Not Completed: Patient at procedure or test/unavailable.  Pt currently off floor at dialysis.  Will attempt to see pt again later today, schedule permitting.    Encarnacion ChuAshley Larz Mark PT, DPT 07/14/2017, 12:55 PM

## 2017-07-14 NOTE — Progress Notes (Signed)
[facility] , [place] 07/14/17  Subjective:  Patient seen and evaluated during hemodialysis. He appears to be tolerating quite well. Blood pressure 118/58.  Objective:  Vital signs in last 24 hours:  Temp:  [98 F (36.7 C)-98.6 F (37 C)] 98.6 F (37 C) (11/23 1015) Pulse Rate:  [86-105] 87 (11/23 1100) Resp:  [16-20] 17 (11/23 1100) BP: (98-157)/(50-64) 117/56 (11/23 1100) SpO2:  [83 %-100 %] 100 % (11/23 1100) FiO2 (%):  [55 %] 55 % (11/22 1338) Weight:  [70 kg (154 lb 5.2 oz)] 70 kg (154 lb 5.2 oz) (11/23 1015)  Weight change:  Filed Weights   07/12/17 1350 07/12/17 1639 07/14/17 1015  Weight: 67.3 kg (148 lb 5.9 oz) 69.5 kg (153 lb 3.2 oz) 70 kg (154 lb 5.2 oz)    Intake/Output:    Intake/Output Summary (Last 24 hours) at 07/14/2017 1120 Last data filed at 07/14/2017 0900 Gross per 24 hour  Intake 0 ml  Output 125 ml  Net -125 ml     Physical Exam: General:  elderly male  HEENT  [place]/AT OM moist  Neck  supple  Pulm/lungs  scattered rhonchi bilateral, normal effort  CVS/Heart  S1S2 no rubs  Abdomen:   Soft, nontender, nondistended  Extremities:  trace LE edema  Neurologic:  Awake, alert, resting in bed  Skin:  No acute rashes          Basic Metabolic Panel:  Recent Labs  Lab 07/08/17 0446 07/09/17 0429 07/11/17 0538 07/11/17 0918 07/12/17 0532 07/13/17 0535  NA 138 137 137  --  137 137  K 5.1 4.4 4.5  --  3.4* 3.8  CL 96* 97* 100*  --  98* 99*  CO2 34* 31 31  --  31 30  GLUCOSE 186* 137* 111*  --  135* 167*  BUN 20 33* 30*  --  31* 31*  CREATININE 2.59* 2.88* 3.20*  --  2.90* 3.11*  CALCIUM 8.1* 8.8* 8.7*  --  8.2* 9.0  MG 2.1  --   --   --   --   --   PHOS 4.9*  --   --  2.9  --   --      CBC: Recent Labs  Lab 07/07/17 1750 07/08/17 0446 07/09/17 0429 07/12/17 0532 07/14/17 1030  WBC 6.5 4.7 6.4 5.4 7.8  NEUTROABS 5.6  --   --   --   --   HGB 10.3* 9.6* 9.1* 9.2* 9.7*  HCT 32.9* 31.9* 30.0* 29.7* 31.2*   MCV 94.9 96.0 95.8 96.4 95.3  PLT 298 331 383 366 375      Lab Results  Component Value Date   HEPBSAG Negative 07/10/2017   HEPBSAB Non Reactive 09/01/2015      Microbiology:  Recent Results (from the past 240 hour(s))  Microscopic Examination     Status: Abnormal   Collection Time: 07/04/17  4:14 PM  Result Value Ref Range Status   WBC, UA 0-5 0 - 5 /hpf Final   RBC, UA >30 (A) 0 - 2 /hpf Final   Epithelial Cells (non renal) 0-10 0 - 10 /hpf Final   Renal Epithel, UA 0-10 None seen /hpf Final   Casts Present (A) None seen /lpf Final   Cast Type Hyaline casts N/A Final   Mucus, UA Present (A) Not Estab. Final   Bacteria, UA Moderate (A) None seen/Few Final  MRSA PCR Screening     Status: None   Collection Time: 07/07/17  11:22 PM  Result Value Ref Range Status   MRSA by PCR NEGATIVE NEGATIVE Final    Comment:        The GeneXpert MRSA Assay (FDA approved for NASAL specimens only), is one component of a comprehensive MRSA colonization surveillance program. It is not intended to diagnose MRSA infection nor to guide or monitor treatment for MRSA infections.   Body fluid culture     Status: None   Collection Time: 07/09/17  1:55 PM  Result Value Ref Range Status   Specimen Description PLEURAL  Final   Special Requests NONE  Final   Gram Stain NO WBC SEEN NO ORGANISMS SEEN   Final   Culture   Final    NO GROWTH 3 DAYS Performed at [facility], [address] [place], [place] [address]    Report Status 07/13/2017 FINAL  Final    Coagulation Studies: No results for input(s): LABPROT, INR in the last 72 hours.  Urinalysis: No results for input(s): COLORURINE, LABSPEC, PHURINE, GLUCOSEU, HGBUR, BILIRUBINUR, KETONESUR, PROTEINUR, UROBILINOGEN, NITRITE, LEUKOCYTESUR in the last 72 hours.  Invalid input(s): APPERANCEUR    Imaging: No results found.   Medications:   . albumin human     . calcium acetate  2,001 mg Oral TID  . cinacalcet  30 mg  Oral Daily  . epoetin (EPOGEN/PROCRIT) injection  4,000 Units Intravenous Q M,W,F-HD  . feeding supplement (NEPRO CARB STEADY)  237 mL Oral BID BM  . ferrous sulfate  325 mg Oral Daily  . heparin  5,000 Units Subcutaneous Q8H  . insulin aspart  0-9 Units Subcutaneous TID WC  . mouth rinse  15 mL Mouth Rinse q12n4p  . megestrol  20 mg Oral Daily  . metoprolol succinate  12.5 mg Oral Daily  . midodrine  5 mg Oral BID WC  . multivitamin  1 tablet Oral QHS  . pravastatin  20 mg Oral q1800  . senna  1 tablet Oral BID  . sertraline  25 mg Oral Daily  . tamsulosin  0.4 mg Oral Daily  . tiotropium  1 capsule Inhalation Daily  . torsemide  100 mg Oral Daily  . traZODone  50 mg Oral QHS  . vitamin C  500 mg Oral Daily   acetaminophen **OR** acetaminophen, ipratropium-albuterol, ondansetron **OR** ondansetron (ZOFRAN) IV, phenol  Assessment/ Plan:  83 y.o. male with end-stage kidney disease, dialysis,diabetes type 2, coronary disease, CABG, anemia of chronic kidney disease, anxiety disorder, GERD, recurrent pleural effusions s/p thoracentesis .  [facility]/MWF-1. [facility]/Dr Kolluru  1.  ESRD on HD MWF 2.  Acute shortness of breath from pulmonary edema with bilateral pleural effusions.   3.  Anemia of chronic kidney disease 4.  Secondary hyperparathyroidism  Plan:  Patient seen and evaluated during hemodialysis today.  He appears to be tolerating this quite well.  Continue Epogen with dialysis treatments.  Hospitalist planning on rehabilitation facility placement.  We will continue to monitor his status with dialysis as an outpatient.   LOS: 7 Magdalene Tardiff 11/23/201811:20 AM  [address] [facility] [place], [place] [phone]

## 2017-07-14 NOTE — Progress Notes (Signed)
Physical Therapy Treatment Patient Details Name: Kenneth [name] MRN: [id number] DOB: Jul 01, 1928 Today's Date: 07/14/2017    History of Present Illness presented to ER after worsening SOB after dialysis; admitted with acute hypoxic respiratory failure due to CHF with bilat pulmonary edema, bilat pleural effusion.  Status post L thoracentesis for 1.0L.  Currently on 12L supplemental O2 with ventimask.    PT Comments    Kenneth [name] made modest progress with mobility.  He put forth good effort but required mod assist for bed mobility and transfers.  When ambulating he demonstrates instability, requiring min assist at times.  SpO2 as low as 88% on 4L O2 while ambulating.  SNF remains most appropriate d/c plan at this time.     Follow Up Recommendations  SNF     Equipment Recommendations       Recommendations for Other Services       Precautions / Restrictions Precautions Precautions: Fall;Other (comment) Precaution Comments: O2; no BP LUE Restrictions Weight Bearing Restrictions: No    Mobility  Bed Mobility Overal bed mobility: Needs Assistance Bed Mobility: Supine to Sit;Sit to Supine     Supine to sit: Mod assist;HOB elevated Sit to supine: Mod assist   General bed mobility comments: Assist to elevate trunk.  Pt with increased time and effort with heavy use of bed rail.  Requires assist at trunk to return to supine.   Transfers Overall transfer level: Needs assistance Equipment used: Rolling walker (2 wheeled) Transfers: Sit to/from Stand Sit to Stand: Mod assist         General transfer comment: Assist to boost to standing after cues to scoot to EOB for improved mechanical advantage.  Pt with poorly controlled descent to sit.   Ambulation/Gait Ambulation/Gait assistance: Min assist Ambulation Distance (Feet): 15 Feet Assistive device: Rolling walker (2 wheeled) Gait Pattern/deviations: Step-through pattern;Decreased stride length;Antalgic;Trunk flexed    Gait velocity interpretation: Below normal speed for age/gender General Gait Details: Pt initially with slower and unsteady gait, requiring min assist to steady.  Pt reports need to void and moves quickly and unsafely in room.  SpO2 down to 88% on 4L O2.    Stairs            Wheelchair Mobility    Modified Rankin (Stroke Patients Only)       Balance Overall balance assessment: Needs assistance Sitting-balance support: No upper extremity supported;Feet supported Sitting balance-Leahy Scale: Good     Standing balance support: Bilateral upper extremity supported;During functional activity Standing balance-Leahy Scale: Poor Standing balance comment: Relies on UE support for static and dynamic activities                            Cognition Arousal/Alertness: Awake/alert Behavior During Therapy: WFL for tasks assessed/performed Overall Cognitive Status: Within Functional Limits for tasks assessed                                        Exercises General Exercises - Lower Extremity Ankle Circles/Pumps: AROM;Both;10 reps;Supine Quad Sets: Strengthening;Both;10 reps;Supine Straight Leg Raises: AROM;Both;10 reps;Supine Other Exercises Other Exercises: Max verbal cues and demonstration during session for pursed lip breathing which pt demonstrates back to this PT with intermittent success    General Comments        Pertinent Vitals/Pain Pain Assessment: No/denies pain    Home Living  Prior Function            PT Goals (current goals can now be found in the care plan section) Acute Rehab PT Goals Patient Stated Goal: to become more independent PT Goal Formulation: With patient Time For Goal Achievement: 07/24/17 Potential to Achieve Goals: Good Progress towards PT goals: Progressing toward goals    Frequency    Min 2X/week      PT Plan Current plan remains appropriate    Co-evaluation               AM-PAC PT "6 Clicks" Daily Activity  Outcome Measure  Difficulty turning over in bed (including adjusting bedclothes, sheets and blankets)?: Unable Difficulty moving from lying on back to sitting on the side of the bed? : Unable Difficulty sitting down on and standing up from a chair with arms (e.g., wheelchair, bedside commode, etc,.)?: Unable Help needed moving to and from a bed to chair (including a wheelchair)?: A Lot Help needed walking in hospital room?: A Lot Help needed climbing 3-5 steps with a railing? : A Lot 6 Click Score: 9    End of Session Equipment Utilized During Treatment: Gait belt;Oxygen Activity Tolerance: Patient tolerated treatment well;Patient limited by fatigue Patient left: in bed;with call bell/phone within reach;with bed alarm set;with nursing/sitter in room Nurse Communication: Mobility status;Other (comment)(SpO2) PT Visit Diagnosis: Unsteadiness on feet (R26.81);Difficulty in walking, not elsewhere classified (R26.2)     Time: [id number] PT Time Calculation (min) (ACUTE ONLY): 21 min  Charges:  $Gait Training: 8-22 mins                    G Codes:       Kenneth [name] PT, DPT 07/14/2017, 4:09 PM

## 2017-07-14 NOTE — NC FL2 (Signed)
[place] MEDICAID [name] LEVEL OF CARE SCREENING TOOL     IDENTIFICATION  Patient Name: Kenneth [name] Birthdate: April 30, 1928 Sex: male Admission Date (Current Location): 07/07/2017  [facility] [place]   Facility and Address:  [facility], [address], [place], [place] [address]      Provider Number: [id number]  Attending Physician Name and Address:  Enedina FinnerPatel, Sona, MD  Relative Name and Phone Number:       Current Level of Care: Hospital Recommended Level of Care: Skilled Nursing Facility Prior Approval Number:    Date Approved/Denied:   PASRR Number:    Discharge Plan: SNF    Current Diagnoses: Patient Active Problem List   Diagnosis Date Noted  . DNR (do not resuscitate)   . Palliative care by specialist   . Adult failure to thrive syndrome   . CHF (congestive heart failure) (HCC) 07/07/2017  . Microscopic hematuria 07/05/2017  . Coronary artery disease of native artery of native heart with stable angina pectoris (HCC) 01/22/2017  . Pulmonary edema 07/15/2016  . Anemia, unspecified 05/20/2016  . ESRD on dialysis (HCC) 09/28/2015  . Ischemic cardiomyopathy   . CKD (chronic kidney disease) stage 4, GFR 15-29 ml/min (HCC) 08/26/2015  . Sinus tachycardia 08/26/2015  . Demand ischemia (HCC)   . Coronary artery disease involving coronary bypass graft of native heart without angina pectoris   . Ischemic chest pain   . Acute non-ST elevation myocardial infarction (NSTEMI) (HCC) 08/24/2015  . NSTEMI (non-ST elevated myocardial infarction) (HCC)   . Angina pectoris (HCC)   . SOB (shortness of breath)   . Chronic diastolic CHF (congestive heart failure) (HCC)   . Centrilobular emphysema (HCC)   . Coronary artery disease involving coronary bypass graft of native heart with angina pectoris with documented spasm (HCC)   . Mixed hyperlipidemia   . ED (erectile dysfunction) 10/06/2014  . Type 2 diabetes mellitus with chronic  kidney disease (HCC) 05/26/2014  . Personal history of other malignant neoplasm of skin 03/19/2012  . OSA on CPAP 08/19/2009  . DYSPNEA 07/17/2009  . SNORING 07/17/2009  . HYPERLIPIDEMIA-MIXED 07/02/2009  . HYPERTENSION, UNSPECIFIED 07/02/2009  . CORONARY ATHEROSCLEROSIS OF ARTERY BYPASS GRAFT 07/02/2009    Orientation RESPIRATION BLADDER Height & Weight     Self, Place, Situation, Time  O2(4 liters) Incontinent Weight: 153 lb 3.2 oz (69.5 kg) Height:  5\' 6"  (167.6 cm)  BEHAVIORAL SYMPTOMS/MOOD NEUROLOGICAL BOWEL NUTRITION STATUS  (none) (none) Incontinent Diet(renal with 12 ml fluid restriction)  AMBULATORY STATUS COMMUNICATION OF NEEDS Skin   Extensive Assist Verbally Normal                       Personal Care Assistance Level of Assistance  Dressing, Bathing Bathing Assistance: Maximum assistance   Dressing Assistance: Maximum assistance     Functional Limitations Info  Sight, Hearing Sight Info: Impaired Hearing Info: Impaired      SPECIAL CARE FACTORS FREQUENCY  PT (By licensed PT)                    Contractures Contractures Info: Not present    Additional Factors Info  Code Status, Allergies(outpatient dialysis) Code Status Info: dnr Allergies Info: bee venom, beef derived products and iodine           Current Medications (07/14/2017):  This is the current hospital active medication list Current Facility-Administered Medications  Medication Dose Route Frequency Provider Last Rate Last Dose  .  acetaminophen (TYLENOL) tablet 650 mg  650 mg Oral Q6H PRN Enedina FinnerPatel, Sona, MD       Or  . acetaminophen (TYLENOL) suppository 650 mg  650 mg Rectal Q6H PRN Enedina FinnerPatel, Sona, MD      . albumin human 25 % solution 25 g  25 g Intravenous Once in dialysis Mosetta PigeonSingh, Harmeet, MD      . calcium acetate (PHOSLO) capsule 2,001 mg  2,001 mg Oral TID Enedina FinnerPatel, Sona, MD   Stopped at 07/14/17 [phone]  . cinacalcet (SENSIPAR) tablet 30 mg  30 mg Oral Daily Enedina FinnerPatel, Sona, MD   Stopped  at 07/14/17 [phone]  . epoetin alfa (EPOGEN,PROCRIT) injection 4,000 Units  4,000 Units Intravenous Q M,W,F-HD Mosetta PigeonSingh, Harmeet, MD   4,000 Units at 07/12/17 1458  . feeding supplement (NEPRO CARB STEADY) liquid 237 mL  237 mL Oral BID BM Enid BaasKalisetti, Radhika, MD   Stopped at 07/14/17 [phone]  . ferrous sulfate tablet 325 mg  325 mg Oral Daily Enedina FinnerPatel, Sona, MD   Stopped at 07/14/17 0951  . heparin injection 5,000 Units  5,000 Units Subcutaneous Q8H Enedina FinnerPatel, Sona, MD   5,000 Units at 07/14/17 0506  . insulin aspart (novoLOG) injection 0-9 Units  0-9 Units Subcutaneous TID WC Enedina FinnerPatel, Sona, MD   1 Units at 07/14/17 0858  . ipratropium-albuterol (DUONEB) 0.5-2.5 (3) MG/3ML nebulizer solution 3 mL  3 mL Nebulization Q4H PRN Enid BaasKalisetti, Radhika, MD      . MEDLINE mouth rinse  15 mL Mouth Rinse q12n4p Lewie Loronukov, Magadalene S, NP   Stopped at 07/14/17 [phone]  . megestrol (MEGACE) tablet 20 mg  20 mg Oral Daily Enedina FinnerPatel, Sona, MD   Stopped at 07/14/17 [phone]  . metoprolol succinate (TOPROL-XL) 24 hr tablet 12.5 mg  12.5 mg Oral Daily Enedina FinnerPatel, Sona, MD   Stopped at 07/14/17 [phone]  . midodrine (PROAMATINE) tablet 5 mg  5 mg Oral BID WC Enid BaasKalisetti, Radhika, MD   5 mg at 07/14/17 1002  . multivitamin (RENA-VIT) tablet 1 tablet  1 tablet Oral QHS Enid BaasKalisetti, Radhika, MD   1 tablet at 07/13/17 2121  . ondansetron (ZOFRAN) tablet 4 mg  4 mg Oral Q6H PRN Enedina FinnerPatel, Sona, MD       Or  . ondansetron [facility](ZOFRAN) injection 4 mg  4 mg Intravenous Q6H PRN Enedina FinnerPatel, Sona, MD   4 mg at 07/10/17 1109  . phenol (CHLORASEPTIC) mouth spray 1 spray  1 spray Mouth/Throat PRN Enedina FinnerPatel, Sona, MD      . pravastatin (PRAVACHOL) tablet 20 mg  20 mg Oral q1800 Enedina FinnerPatel, Sona, MD   20 mg at 07/13/17 1715  . senna (SENOKOT) tablet 8.6 mg  1 tablet Oral BID Enedina FinnerPatel, Sona, MD   Stopped at 07/14/17 [phone]  . sertraline (ZOLOFT) tablet 25 mg  25 mg Oral Daily Enedina FinnerPatel, Sona, MD   Stopped at 07/14/17 [phone]  . tamsulosin (FLOMAX) capsule 0.4 mg  0.4 mg Oral Daily Enedina FinnerPatel, Sona, MD   Stopped at  07/14/17 [phone]  . tiotropium (SPIRIVA) inhalation capsule 18 mcg  1 capsule Inhalation Daily Enid BaasKalisetti, Radhika, MD   Stopped at 07/14/17 [phone]  . torsemide (DEMADEX) tablet 100 mg  100 mg Oral Daily Enedina FinnerPatel, Sona, MD   Stopped at 07/14/17 [phone]  . traZODone (DESYREL) tablet 50 mg  50 mg Oral QHS Enedina FinnerPatel, Sona, MD   50 mg at 07/13/17 2122  . vitamin C (ASCORBIC ACID) tablet 500 mg  500 mg Oral Daily Enid BaasKalisetti, Radhika, MD   Stopped at 07/14/17 [phone]  Discharge Medications: Please see discharge summary for a list of discharge medications.  Relevant Imaging Results:  Relevant Lab Results:   Additional Information ss: [id number]  York Spaniel, LCSW

## 2017-08-14 ENCOUNTER — Emergency Department: Payer: Medicare Other

## 2017-08-14 ENCOUNTER — Other Ambulatory Visit: Payer: Self-pay

## 2017-08-14 ENCOUNTER — Inpatient Hospital Stay
Admission: EM | Admit: 2017-08-14 | Discharge: 2017-08-22 | DRG: 070 | Disposition: E | Payer: Medicare Other | Attending: Specialist | Admitting: Specialist

## 2017-08-14 ENCOUNTER — Encounter: Payer: Self-pay | Admitting: Emergency Medicine

## 2017-08-14 DIAGNOSIS — Z951 Presence of aortocoronary bypass graft: Secondary | ICD-10-CM

## 2017-08-14 DIAGNOSIS — Z8249 Family history of ischemic heart disease and other diseases of the circulatory system: Secondary | ICD-10-CM

## 2017-08-14 DIAGNOSIS — D631 Anemia in chronic kidney disease: Secondary | ICD-10-CM | POA: Diagnosis present

## 2017-08-14 DIAGNOSIS — I255 Ischemic cardiomyopathy: Secondary | ICD-10-CM | POA: Diagnosis present

## 2017-08-14 DIAGNOSIS — Z91030 Bee allergy status: Secondary | ICD-10-CM

## 2017-08-14 DIAGNOSIS — G9341 Metabolic encephalopathy: Principal | ICD-10-CM | POA: Diagnosis present

## 2017-08-14 DIAGNOSIS — N2581 Secondary hyperparathyroidism of renal origin: Secondary | ICD-10-CM | POA: Diagnosis present

## 2017-08-14 DIAGNOSIS — I1311 Hypertensive heart and chronic kidney disease without heart failure, with stage 5 chronic kidney disease, or end stage renal disease: Secondary | ICD-10-CM | POA: Diagnosis present

## 2017-08-14 DIAGNOSIS — R443 Hallucinations, unspecified: Secondary | ICD-10-CM | POA: Diagnosis present

## 2017-08-14 DIAGNOSIS — W19XXXA Unspecified fall, initial encounter: Secondary | ICD-10-CM | POA: Diagnosis present

## 2017-08-14 DIAGNOSIS — E538 Deficiency of other specified B group vitamins: Secondary | ICD-10-CM | POA: Diagnosis present

## 2017-08-14 DIAGNOSIS — G4733 Obstructive sleep apnea (adult) (pediatric): Secondary | ICD-10-CM | POA: Diagnosis present

## 2017-08-14 DIAGNOSIS — R627 Adult failure to thrive: Secondary | ICD-10-CM | POA: Diagnosis present

## 2017-08-14 DIAGNOSIS — I25810 Atherosclerosis of coronary artery bypass graft(s) without angina pectoris: Secondary | ICD-10-CM | POA: Diagnosis present

## 2017-08-14 DIAGNOSIS — S0181XA Laceration without foreign body of other part of head, initial encounter: Secondary | ICD-10-CM | POA: Diagnosis present

## 2017-08-14 DIAGNOSIS — Z841 Family history of disorders of kidney and ureter: Secondary | ICD-10-CM

## 2017-08-14 DIAGNOSIS — F419 Anxiety disorder, unspecified: Secondary | ICD-10-CM | POA: Diagnosis present

## 2017-08-14 DIAGNOSIS — J432 Centrilobular emphysema: Secondary | ICD-10-CM | POA: Diagnosis present

## 2017-08-14 DIAGNOSIS — F0390 Unspecified dementia without behavioral disturbance: Secondary | ICD-10-CM | POA: Diagnosis present

## 2017-08-14 DIAGNOSIS — Z515 Encounter for palliative care: Secondary | ICD-10-CM | POA: Diagnosis not present

## 2017-08-14 DIAGNOSIS — R296 Repeated falls: Secondary | ICD-10-CM | POA: Diagnosis present

## 2017-08-14 DIAGNOSIS — I2510 Atherosclerotic heart disease of native coronary artery without angina pectoris: Secondary | ICD-10-CM | POA: Diagnosis present

## 2017-08-14 DIAGNOSIS — Z91041 Radiographic dye allergy status: Secondary | ICD-10-CM

## 2017-08-14 DIAGNOSIS — N186 End stage renal disease: Secondary | ICD-10-CM | POA: Diagnosis present

## 2017-08-14 DIAGNOSIS — E1122 Type 2 diabetes mellitus with diabetic chronic kidney disease: Secondary | ICD-10-CM | POA: Diagnosis present

## 2017-08-14 DIAGNOSIS — J9611 Chronic respiratory failure with hypoxia: Secondary | ICD-10-CM | POA: Diagnosis present

## 2017-08-14 DIAGNOSIS — M1990 Unspecified osteoarthritis, unspecified site: Secondary | ICD-10-CM | POA: Diagnosis present

## 2017-08-14 DIAGNOSIS — Z7982 Long term (current) use of aspirin: Secondary | ICD-10-CM

## 2017-08-14 DIAGNOSIS — Z992 Dependence on renal dialysis: Secondary | ICD-10-CM

## 2017-08-14 DIAGNOSIS — I5032 Chronic diastolic (congestive) heart failure: Secondary | ICD-10-CM | POA: Diagnosis present

## 2017-08-14 DIAGNOSIS — Z91018 Allergy to other foods: Secondary | ICD-10-CM

## 2017-08-14 DIAGNOSIS — S0990XA Unspecified injury of head, initial encounter: Secondary | ICD-10-CM

## 2017-08-14 DIAGNOSIS — Z66 Do not resuscitate: Secondary | ICD-10-CM | POA: Diagnosis present

## 2017-08-14 DIAGNOSIS — Z87891 Personal history of nicotine dependence: Secondary | ICD-10-CM

## 2017-08-14 DIAGNOSIS — Z6825 Body mass index (BMI) 25.0-25.9, adult: Secondary | ICD-10-CM

## 2017-08-14 DIAGNOSIS — Z96652 Presence of left artificial knee joint: Secondary | ICD-10-CM | POA: Diagnosis present

## 2017-08-14 DIAGNOSIS — E782 Mixed hyperlipidemia: Secondary | ICD-10-CM | POA: Diagnosis present

## 2017-08-14 DIAGNOSIS — I252 Old myocardial infarction: Secondary | ICD-10-CM

## 2017-08-14 LAB — COMPREHENSIVE METABOLIC PANEL
ALBUMIN: 2.8 g/dL — AB (ref 3.5–5.0)
ALK PHOS: 75 U/L (ref 38–126)
ALT: 7 U/L — ABNORMAL LOW (ref 17–63)
ANION GAP: 9 (ref 5–15)
AST: 19 U/L (ref 15–41)
BUN: 19 mg/dL (ref 6–20)
CALCIUM: 8.2 mg/dL — AB (ref 8.9–10.3)
CHLORIDE: 94 mmol/L — AB (ref 101–111)
CO2: 34 mmol/L — AB (ref 22–32)
Creatinine, Ser: 2.87 mg/dL — ABNORMAL HIGH (ref 0.61–1.24)
GFR calc non Af Amer: 18 mL/min — ABNORMAL LOW (ref >60)
GFR, EST AFRICAN AMERICAN: 21 mL/min — AB (ref >60)
GLUCOSE: 150 mg/dL — AB (ref 65–99)
POTASSIUM: 3.4 mmol/L — AB (ref 3.5–5.1)
SODIUM: 137 mmol/L (ref 135–145)
Total Bilirubin: 0.6 mg/dL (ref 0.3–1.2)
Total Protein: 7.1 g/dL (ref 6.5–8.1)

## 2017-08-14 LAB — CBC WITH DIFFERENTIAL/PLATELET
BASOS PCT: 1 %
Basophils Absolute: 0 10*3/uL (ref 0–0.1)
EOS ABS: 0 10*3/uL (ref 0–0.7)
EOS PCT: 1 %
HCT: 34.4 % — ABNORMAL LOW (ref 40.0–52.0)
HEMOGLOBIN: 10.6 g/dL — AB (ref 13.0–18.0)
LYMPHS ABS: 0.4 10*3/uL — AB (ref 1.0–3.6)
Lymphocytes Relative: 9 %
MCH: 30.9 pg (ref 26.0–34.0)
MCHC: 31 g/dL — AB (ref 32.0–36.0)
MCV: 99.7 fL (ref 80.0–100.0)
MONOS PCT: 9 %
Monocytes Absolute: 0.5 10*3/uL (ref 0.2–1.0)
NEUTROS PCT: 80 %
Neutro Abs: 3.9 10*3/uL (ref 1.4–6.5)
PLATELETS: 237 10*3/uL (ref 150–440)
RBC: 3.45 MIL/uL — ABNORMAL LOW (ref 4.40–5.90)
RDW: 21.9 % — AB (ref 11.5–14.5)
WBC: 4.9 10*3/uL (ref 3.8–10.6)

## 2017-08-14 LAB — TROPONIN I: Troponin I: 0.09 ng/mL (ref <0.03)

## 2017-08-14 MED ORDER — ACETAMINOPHEN 650 MG RE SUPP: 650.0000 mg | Freq: Four times a day (QID) | RECTAL | Status: DC | PRN

## 2017-08-14 MED ORDER — MORPHINE SULFATE (PF) 2 MG/ML IV SOLN: 2.0000 mg | INTRAVENOUS | Status: DC | PRN

## 2017-08-14 MED ORDER — ONDANSETRON HCL 4 MG PO TABS: 4.0000 mg | ORAL_TABLET | Freq: Four times a day (QID) | ORAL | Status: DC | PRN

## 2017-08-14 MED ORDER — TAMSULOSIN HCL 0.4 MG PO CAPS
0.4000 mg | ORAL_CAPSULE | Freq: Every day | ORAL | Status: DC
  Administered 2017-08-14: 0.4 mg via ORAL
  Filled 2017-08-14: qty 1

## 2017-08-14 MED ORDER — NITROGLYCERIN 0.4 MG SL SUBL: 0.4000 mg | SUBLINGUAL_TABLET | SUBLINGUAL | Status: DC | PRN

## 2017-08-14 MED ORDER — MORPHINE SULFATE (PF) 2 MG/ML IV SOLN
2.0000 mg | INTRAVENOUS | Status: DC | PRN
  Administered 2017-08-14: 2 mg via INTRAVENOUS
  Filled 2017-08-14: qty 1

## 2017-08-14 MED ORDER — ONDANSETRON HCL 4 MG/2ML IJ SOLN: 4.0000 mg | Freq: Four times a day (QID) | INTRAMUSCULAR | Status: DC | PRN

## 2017-08-14 MED ORDER — ACETAMINOPHEN 325 MG PO TABS: 650.0000 mg | ORAL_TABLET | Freq: Four times a day (QID) | ORAL | Status: DC | PRN

## 2017-08-14 MED ORDER — TORSEMIDE 20 MG PO TABS
100.0000 mg | ORAL_TABLET | Freq: Every day | ORAL | Status: DC
  Administered 2017-08-14: 100 mg via ORAL
  Filled 2017-08-14: qty 5

## 2017-08-14 MED ORDER — LORAZEPAM 2 MG/ML IJ SOLN
1.0000 mg | INTRAMUSCULAR | Status: DC | PRN
  Administered 2017-08-15: 1 mg via INTRAVENOUS
  Filled 2017-08-14: qty 1

## 2017-08-14 NOTE — Progress Notes (Signed)
[facility]  ROUNDING NOTE   Subjective:   [name] admitted to [facility] on 08/08/2017 for fall  Last hemodialysis was yesterday.   Objective:  Vital signs in last 24 hours:  Temp:  [97.3 F (36.3 C)] 97.3 F (36.3 C) (12/24 0949) Pulse Rate:  [85] 85 (12/24 0949) Resp:  [19-23] 19 (12/24 1200) BP: (93-103)/(59-70) 93/70 (12/24 1200) Weight:  [72.6 kg (160 lb)] 72.6 kg (160 lb) (12/24 0944)  Weight change:  Filed Weights   08/20/2017 0944  Weight: 72.6 kg (160 lb)    Intake/Output: No intake/output data recorded.   Intake/Output this shift:  No intake/output data recorded.  Physical Exam: General: Ill appearing  Head: Normocephalic, atraumatic. Moist oral mucosal membranes  Eyes: Anicteric, PERRL  Neck: Supple, trachea midline  Lungs:  Clear to auscultation  Heart: Regular rate and rhythm  Abdomen:  Soft, nontender,   Extremities: no peripheral edema.  Neurologic: Confused, lethargic  Skin: No lesions  Access: Left AVF    Basic Metabolic Panel: Recent Labs  Lab 07/27/2017 0950  NA 137  K 3.4*  CL 94*  CO2 34*  GLUCOSE 150*  BUN 19  CREATININE 2.87*  CALCIUM 8.2*    Liver Function Tests: Recent Labs  Lab 08/01/2017 0950  AST 19  ALT 7*  ALKPHOS 75  BILITOT 0.6  PROT 7.1  ALBUMIN 2.8*   No results for input(s): LIPASE, AMYLASE in the last 168 hours. No results for input(s): AMMONIA in the last 168 hours.  CBC: Recent Labs  Lab 08/17/2017 0950  WBC 4.9  NEUTROABS 3.9  HGB 10.6*  HCT 34.4*  MCV 99.7  PLT 237    Cardiac Enzymes: Recent Labs  Lab 07/27/2017 0950  TROPONINI 0.09*    BNP: Invalid input(s): POCBNP  CBG: No results for input(s): GLUCAP in the last 168 hours.  Microbiology: Results for orders placed or performed during the hospital encounter of 07/07/17  MRSA PCR Screening     Status: None   Collection Time: 07/07/17 11:22 PM  Result Value Ref Range Status   MRSA by PCR NEGATIVE NEGATIVE Final   Comment:        The GeneXpert MRSA Assay (FDA approved for NASAL specimens only), is one component of a comprehensive MRSA colonization surveillance program. It is not intended to diagnose MRSA infection nor to guide or monitor treatment for MRSA infections.   Body fluid culture     Status: None   Collection Time: 07/09/17  1:55 PM  Result Value Ref Range Status   Specimen Description PLEURAL  Final   Special Requests NONE  Final   Gram Stain NO WBC SEEN NO ORGANISMS SEEN   Final   Culture   Final    NO GROWTH 3 DAYS Performed at [facility], [address] [place], [place] [address]    Report Status 07/13/2017 FINAL  Final    Coagulation Studies: No results for input(s): LABPROT, INR in the last 72 hours.  Urinalysis: No results for input(s): COLORURINE, LABSPEC, PHURINE, GLUCOSEU, HGBUR, BILIRUBINUR, KETONESUR, PROTEINUR, UROBILINOGEN, NITRITE, LEUKOCYTESUR in the last 72 hours.  Invalid input(s): APPERANCEUR    Imaging: Dg Chest 1 View  Result Date: 07/31/2017 CLINICAL DATA:  Status post fall today. EXAM: CHEST 1 VIEW COMPARISON:  Single-view of the chest 06/29/2017. FINDINGS: Moderate to moderately large bilateral pleural effusions are seen, larger on the left. Left pleural effusion has increased since the prior examination. Associated compressive atelectasis is noted. There  is cardiomegaly without pulmonary edema. No acute bony abnormality. IMPRESSION: Moderate to moderately large pleural effusions are seen, greater on the left. Left effusion is increased since the prior exam. Associated compressive basilar atelectasis noted. Cardiomegaly without edema. Electronically Signed   By: Drusilla Kannerhomas  Dalessio M.D.   On: 07/23/2017 10:08     Medications:       Assessment/ Plan:  [name] is a 83 y.o. white male with end stage renal disease on hemodialysis since 08/2015, diabetic renal disease, hypertension, hyperlipidemia, coronary disease, obstructive  sleep apnea, CAD.   [facility] AVF  1. End Stage Renal Disease 2. Hypertension 3. Anemia of chronic kidney disease 4. Secondary Hyperparathyroidism.   After long conversation with family. Family wants to pursue comfort care. Discussed care with ED physician.    LOS: 0 Diyana Starrett 12/24/20181:03 PM

## 2017-08-14 NOTE — H&P (Signed)
[facility] [place] [facility]   PATIENT NAME: Kenneth [name]    MR#:  [id number]  DATE OF BIRTH:  [birth year]  DATE OF ADMISSION:  07/02/17  PRIMARY CARE PHYSICIAN: Barbette ReichmannHande, Vishwanath, MD   REQUESTING/REFERRING PHYSICIAN:   CHIEF COMPLAINT:   fall HISTORY OF PRESENT ILLNESS:  Kenneth [name]  is a 83 y.o. male with a known history of end-stage renal disease,coronary artery disease, dementia, B12 deficiency, arthritis and multiple other medical problems is brought into the ED after he sustained a fall. Patient admitted to the hospital with a chin laceration but bleeding was well controlled by the time of arrival. According to the family members patient is frequently falling and hallucinating more. Family has discussed with the ED physician and nephrologist and decided to pursue comfort care measures. Hospitalist team is called to admit the patient is hospice beds are not available. Daughter who is the healthcare power of attorney is at bedside and agreeable with comfort care. Patient is altered from baseline dementia  PAST MEDICAL HISTORY:   Past Medical History:  Diagnosis Date  . Anemia   . Anxiety   . Arthritis   . B12 deficiency   . Coronary artery disease    a. 2vCABG LIMA-LAD, SVG-OM1 in 1999; b. Lexiscan myoview (5/10) showed EF 45%, HK apex & lateral wall, fixed perfusion defect involving apex, inferoapex, & inferolateral wall, also some inferoapical ischemia; LHC (5/10) showed patent LIMA-LAD and totally occluded SVG-OM1, 99% pLAD, 40% ostial CFX, 40% mCFX, 30% OM1, 20% pRCA  . Diabetes mellitus   . ESRD on hemodialysis (HCC)    a. once weekly as of 08/2015. Currently 3xweek since 09/11/15.  Marland Kitchen. GERD (gastroesophageal reflux disease)   . History of MRI of chest 11/10   a. Cardiac; EF 48%, mid anterolateral and inferolateral walls, apical lateral wall, apical septal wall, and true apex were all think and akinetic; 51-75% thickness subendocardial  delayed enhancement in mid anterolateral and inferolateral walls, full thickness enhancement in apical laterla and apical septal walls and true apex  . Hyperlipidemia   . Hypertension   . Ischemic cardiomyopathy    a. likely ICM, EF 55-60%, RWMA could not be excluded, nl PASP, nl CVP  . Nephrolithiasis   . Obesity   . Occasional tremors   . OSA (obstructive sleep apnea)    Moderate CPAP  . Shortness of breath dyspnea   . Spinal stenosis     PAST SURGICAL HISTOIRY:   Past Surgical History:  Procedure Laterality Date  . BACK SURGERY     Multiple, with spinal stenosis  . BASCILIC VEIN TRANSPOSITION Left 12/11/2015   Procedure: BASCILIC VEIN TRANSPOSITION ( STAGE 1 );  Surgeon: Renford DillsGregory G Schnier, MD;  Location: [facility] ORS;  Service: Vascular;  Laterality: Left;  . CARDIAC CATHETERIZATION    . CATARACT EXTRACTION    . CORONARY ARTERY BYPASS GRAFT  1999   In [place] with LIMA-LAD and SVG-OM1  . EYE SURGERY Bilateral    Cataract extraction with IOL  . INSERTION OF DIALYSIS CATHETER Right    [facility]  . JOINT REPLACEMENT Left    Total Knee Replacement  . PERIPHERAL VASCULAR CATHETERIZATION N/A 09/01/2015   Procedure: Dialysis/Perma Catheter Insertion;  Surgeon: Renford DillsGregory G Schnier, MD;  Location: [facility] INVASIVE CV LAB;  Service: Cardiovascular;  Laterality: N/A;  . PERIPHERAL VASCULAR CATHETERIZATION Left 02/25/2016   Procedure: Dialysis/Perma Catheter Removal;  Surgeon: Annice NeedyJason S Dew, MD;  Location: [facility] INVASIVE CV LAB;  Service:  Cardiovascular;  Laterality: Left;  . PERIPHERAL VASCULAR CATHETERIZATION Left 03/22/2016   Procedure: A/V Shuntogram/Fistulagram;  Surgeon: Renford Dills, MD;  Location: [facility] INVASIVE CV LAB;  Service: Cardiovascular;  Laterality: Left;  . TOTAL KNEE ARTHROPLASTY  5/10    SOCIAL HISTORY:   Social History   Tobacco Use  . Smoking status: Former Smoker    Types: Cigarettes  . Smokeless tobacco: Never Used  Substance Use Topics  . Alcohol use: No     FAMILY HISTORY:   Family History  Problem Relation Age of Onset  . Kidney failure Mother   . Heart failure Father     DRUG ALLERGIES:   Allergies  Allergen Reactions  . Bee Venom   . Beef-Derived Products Hives  . Iodine Hives  . Ivp Dye [Iodinated Diagnostic Agents] Hives    REVIEW OF SYSTEMS:  ROS unobtainable MEDICATIONS AT HOME:   Prior to Admission medications   Medication Sig Start Date End Date Taking? Authorizing Provider  aspirin EC 81 MG tablet Take 81 mg by mouth daily.   Yes [provider]  lovastatin (MEVACOR) 20 MG tablet Take 20 mg by mouth at bedtime.   Yes [provider]  metoprolol succinate (TOPROL XL) 25 MG 24 hr tablet Take 0.5 tablets (12.5 mg total) by mouth daily. Patient taking differently: Take 25 mg by mouth daily.  04/22/17  Yes Altamese Dilling, MD  sertraline (ZOLOFT) 25 MG tablet Take 1 tablet by mouth daily.  12/07/16  Yes [provider]  SPIRIVA HANDIHALER 18 MCG inhalation capsule Place 1 capsule into inhaler and inhale daily. 05/11/16  Yes [provider]  tamsulosin (FLOMAX) 0.4 MG CAPS capsule Take 1 capsule (0.4 mg total) by mouth daily. 09/04/15  Yes Doha Boling, Deanna Artis, MD  traZODone (DESYREL) 50 MG tablet Take 1 tablet by mouth at bedtime. 05/17/16  Yes [provider]  vitamin C (VITAMIN C) 500 MG tablet Take 1 tablet (500 mg total) by mouth daily. 07/15/17  Yes Enedina Finner, MD  B Complex-C-Folic Acid (VOL-CARE RX) 1 MG TABS Take 1 tablet by mouth every evening.  12/15/16   [provider]  calcium acetate (PHOSLO) 667 MG capsule Take 2,001 mg by mouth 3 (three) times daily.  06/17/16   [provider]  cinacalcet (SENSIPAR) 30 MG tablet Take 1 tablet by mouth daily. 12/17/15   [provider]  ferrous sulfate 325 (65 FE) MG tablet Take 325 mg by mouth daily.    [provider]  ipratropium-albuterol (DUONEB) 0.5-2.5 (3) MG/3ML SOLN Take 3 mLs by nebulization  every 4 (four) hours as needed. 07/14/17   Enedina Finner, MD  Lancets (FREESTYLE) lancets Use 3 times a day to check blood sugar. 04/21/17   Altamese Dilling, MD  lidocaine-prilocaine (EMLA) cream Apply 2.5 application topically as needed.    [provider]  megestrol (MEGACE) 20 MG tablet Take 20 mg by mouth daily.    [provider]  midodrine (PROAMATINE) 5 MG tablet Take 1 tablet (5 mg total) by mouth 2 (two) times daily with a meal. 07/14/17   Enedina Finner, MD  nitroGLYCERIN (NITROSTAT) 0.4 MG SL tablet Place 1 tablet (0.4 mg total) under the tongue every 5 (five) minutes as needed for chest pain. 08/17/16 04/19/18  Antonieta Iba, MD  Nutritional Supplements (FEEDING SUPPLEMENT, NEPRO CARB STEADY,) LIQD Take 237 mLs by mouth daily. 04/21/17   Altamese Dilling, MD  torsemide (DEMADEX) 100 MG tablet Take 100 mg daily by mouth.  [provider]      VITAL SIGNS:  Blood pressure 93/70, pulse 85, temperature (!) 97.3 F (36.3 C), temperature source Axillary, resp. rate 19, height 5\' 7"  (1.702 m), weight 72.6 kg (160 lb).  PHYSICAL EXAMINATION:  GENERAL:  83 y.o.-year-old patient lying in the bed with no acute distress, but very restless EYES: Pupils equal, round, reactive to light and accommodation. No scleral icterus. Extraocular muscles intact.  HEENT: Head atraumatic, normocephalic. Oropharynx and nasopharynx clear.  NECK:  Supple, no jugular venous distention. No thyroid enlargement, no tenderness.  LUNGS: Diminished breath sounds bilaterally, no wheezing, has rales,rhonchi ,no  crepitation. No use of accessory muscles of respiration.  CARDIOVASCULAR: S1, S2 normal. No murmurs, rubs, or gallops.  ABDOMEN: Soft, nontender, nondistended. Bowel sounds present. Marland Kitchen  EXTREMITIES: No pedal edema, cyanosis, or clubbing.  NEUROLOGIC: Patient is disoriented  Sensation intact. Gait not checked.  PSYCHIATRIC: The patient is  disoriented  SKIN: chin laceration  , bruises are present on the extremities   LABORATORY PANEL:   CBC Recent Labs  Lab 08/08/2017 0950  WBC 4.9  HGB 10.6*  HCT 34.4*  PLT 237   ------------------------------------------------------------------------------------------------------------------  Chemistries  Recent Labs  Lab 07/27/2017 0950  NA 137  K 3.4*  CL 94*  CO2 34*  GLUCOSE 150*  BUN 19  CREATININE 2.87*  CALCIUM 8.2*  AST 19  ALT 7*  ALKPHOS 75  BILITOT 0.6   ------------------------------------------------------------------------------------------------------------------  Cardiac Enzymes Recent Labs  Lab 07/26/2017 0950  TROPONINI 0.09*   ------------------------------------------------------------------------------------------------------------------  RADIOLOGY:  Dg Chest 1 View  Result Date: 08/04/2017 CLINICAL DATA:  Status post fall today. EXAM: CHEST 1 VIEW COMPARISON:  Single-view of the chest 06/29/2017. FINDINGS: Moderate to moderately large bilateral pleural effusions are seen, larger on the left. Left pleural effusion has increased since the prior examination. Associated compressive atelectasis is noted. There is cardiomegaly without pulmonary edema. No acute bony abnormality. IMPRESSION: Moderate to moderately large pleural effusions are seen, greater on the left. Left effusion is increased since the prior exam. Associated compressive basilar atelectasis noted. Cardiomegaly without edema. Electronically Signed   By: Drusilla Kanner M.D.   On: 08/14/2017 10:08    EKG:   Orders placed or performed during the hospital encounter of 08/14/17  . ED EKG  . ED EKG  . EKG 12-Lead  . EKG 12-Lead    IMPRESSION AND PLAN:  Yama Nielson  is a 83 y.o. male with a known history of end-stage renal disease,coronary artery disease, dementia, B12 deficiency, arthritis and multiple other medical problems is brought into the ED after he sustained a fall. Patient's clinical situation is deteriorating,  family has decided to make him DO NOT RESUSCITATE with comfort care   Encephalopathy with underlying chronic dementia # FTT in adult with frequent falls  End-stage renal disease  Coronary artery disease Obstructive sleep apnea Diabetes mellitus  Patient's daughter who is the healthcare power of attorney is requesting comfort care measures. Will provide oxygen as needed Morphine as needed for anxiety, pain and air hunger Tylenol as needed Discontinue checking vital signs and blood work and other investigations Goal is to keep the patient as comfortable as possible Consult placed to hospice care and social work for placement to hospice home when bed is available  All the records are reviewed and case discussed with ED provider. Management plans discussed with the patient's daughter HCPOA  and she is  in agreement.  CODE STATUS:  TOTAL TIME TAKING CARE OF THIS  PATIENT: 39 minutes.   Note: This dictation was prepared with Dragon dictation along with smaller phrase technology. Any transcriptional errors that result from this process are unintentional.  Ramonita LabGouru, Mikaelah Trostle M.D on 16-Dec-2016 at 12:46 PM  Between 7am to 6pm - Pager - [phone]  After 6pm go to [email or web address] - password [email or web address]  [facility]  Office  [phone]  CC: Primary care physician; Barbette ReichmannHande, Vishwanath, MD

## 2017-08-14 NOTE — ED Triage Notes (Signed)
Arrives via [facility].  EMS called to residence for fall.  Initially called at 0400 for same, but transport refused.l  Arrives with chin laceration.  Bleeding controlled.  Patient has history of Alzheimer dementia, DM, HTN, CRF.  Spouse reports more frequent falls and patient hallucinating more.

## 2017-08-14 NOTE — ED Notes (Signed)
Patient assisted to use bedpan. Patient cleaned and brief changed. Patient repositioned in bed and given pillow and drink.

## 2017-08-14 NOTE — ED Provider Notes (Signed)
[facility] Emergency Department Provider Note       Time seen: ----------------------------------------- 9:55 AM on 08/10/2017 ----------------------------------------- Level V caveat: History/ROS limited by altered mental status  I have reviewed the triage vital signs and the nursing notes.  HISTORY   Chief Complaint Fall    HPI Kenneth [name] is a 83 y.o. male with a history of anemia, anxiety, arthritis, end-stage renal disease on dialysis, diabetes and dementia who presents to the ED for a fall.  Initially EMS was called out at 4 AM for a fall transport was refused.  He arrives with a chin laceration but bleeding controlled.  He does have extensive medical history again as noted above with dementia.  Spouse reports she is having more frequent falls and is hallucinating more.  No further information is available.  Past Medical History:  Diagnosis Date  . Anemia   . Anxiety   . Arthritis   . B12 deficiency   . Coronary artery disease    a. 2vCABG LIMA-LAD, SVG-OM1 in 1999; b. Lexiscan myoview (5/10) showed EF 45%, HK apex & lateral wall, fixed perfusion defect involving apex, inferoapex, & inferolateral wall, also some inferoapical ischemia; LHC (5/10) showed patent LIMA-LAD and totally occluded SVG-OM1, 99% pLAD, 40% ostial CFX, 40% mCFX, 30% OM1, 20% pRCA  . Diabetes mellitus   . ESRD on hemodialysis (HCC)    a. once weekly as of 08/2015. Currently 3xweek since 09/11/15.  Marland Kitchen. GERD (gastroesophageal reflux disease)   . History of MRI of chest 11/10   a. Cardiac; EF 48%, mid anterolateral and inferolateral walls, apical lateral wall, apical septal wall, and true apex were all think and akinetic; 51-75% thickness subendocardial delayed enhancement in mid anterolateral and inferolateral walls, full thickness enhancement in apical laterla and apical septal walls and true apex  . Hyperlipidemia   . Hypertension   . Ischemic cardiomyopathy    a. likely ICM,  EF 55-60%, RWMA could not be excluded, nl PASP, nl CVP  . Nephrolithiasis   . Obesity   . Occasional tremors   . OSA (obstructive sleep apnea)    Moderate CPAP  . Shortness of breath dyspnea   . Spinal stenosis     Patient Active Problem List   Diagnosis Date Noted  . Chronic respiratory failure with hypoxia (HCC)   . DNR (do not resuscitate)   . Palliative care by specialist   . Adult failure to thrive syndrome   . CHF (congestive heart failure) (HCC) 07/07/2017  . Microscopic hematuria 07/05/2017  . Coronary artery disease of native artery of native heart with stable angina pectoris (HCC) 01/22/2017  . Pulmonary edema 07/15/2016  . Anemia, unspecified 05/20/2016  . ESRD on dialysis (HCC) 09/28/2015  . Ischemic cardiomyopathy   . CKD (chronic kidney disease) stage 4, GFR 15-29 ml/min (HCC) 08/26/2015  . Sinus tachycardia 08/26/2015  . Demand ischemia (HCC)   . Coronary artery disease involving coronary bypass graft of native heart without angina pectoris   . Ischemic chest pain   . Acute non-ST elevation myocardial infarction (NSTEMI) (HCC) 08/24/2015  . NSTEMI (non-ST elevated myocardial infarction) (HCC)   . Angina pectoris (HCC)   . SOB (shortness of breath)   . Chronic diastolic CHF (congestive heart failure) (HCC)   . Centrilobular emphysema (HCC)   . Coronary artery disease involving coronary bypass graft of native heart with angina pectoris with documented spasm (HCC)   . Mixed hyperlipidemia   . ED (erectile dysfunction) 10/06/2014  .  Type 2 diabetes mellitus with chronic kidney disease (HCC) 05/26/2014  . Personal history of other malignant neoplasm of skin 03/19/2012  . OSA on CPAP 08/19/2009  . DYSPNEA 07/17/2009  . SNORING 07/17/2009  . HYPERLIPIDEMIA-MIXED 07/02/2009  . HYPERTENSION, UNSPECIFIED 07/02/2009  . CORONARY ATHEROSCLEROSIS OF ARTERY BYPASS GRAFT 07/02/2009    Past Surgical History:  Procedure Laterality Date  . BACK SURGERY     Multiple, with  spinal stenosis  . BASCILIC VEIN TRANSPOSITION Left 12/11/2015   Procedure: BASCILIC VEIN TRANSPOSITION ( STAGE 1 );  Surgeon: Renford Dills, MD;  Location: [facility] ORS;  Service: Vascular;  Laterality: Left;  . CARDIAC CATHETERIZATION    . CATARACT EXTRACTION    . CORONARY ARTERY BYPASS GRAFT  1999   In [place] with LIMA-LAD and SVG-OM1  . EYE SURGERY Bilateral    Cataract extraction with IOL  . INSERTION OF DIALYSIS CATHETER Right    Dr. Wyn Quaker, [facility]  . JOINT REPLACEMENT Left    Total Knee Replacement  . PERIPHERAL VASCULAR CATHETERIZATION N/A 09/01/2015   Procedure: Dialysis/Perma Catheter Insertion;  Surgeon: Renford Dills, MD;  Location: [facility] INVASIVE CV LAB;  Service: Cardiovascular;  Laterality: N/A;  . PERIPHERAL VASCULAR CATHETERIZATION Left 02/25/2016   Procedure: Dialysis/Perma Catheter Removal;  Surgeon: Annice Needy, MD;  Location: [facility] INVASIVE CV LAB;  Service: Cardiovascular;  Laterality: Left;  . PERIPHERAL VASCULAR CATHETERIZATION Left 03/22/2016   Procedure: A/V Shuntogram/Fistulagram;  Surgeon: Renford Dills, MD;  Location: [facility] INVASIVE CV LAB;  Service: Cardiovascular;  Laterality: Left;  . TOTAL KNEE ARTHROPLASTY  5/10    Allergies Bee venom; Beef-derived products; Iodine; and Ivp dye [iodinated diagnostic agents]  Social History Social History   Tobacco Use  . Smoking status: Former Smoker    Types: Cigarettes  . Smokeless tobacco: Never Used  Substance Use Topics  . Alcohol use: No  . Drug use: No    Review of Systems Unknown: Positive for chin laceration, altered mental status and falling  All systems negative/normal/unremarkable/unknown except as stated in the HPI  ____________________________________________   PHYSICAL EXAM:  VITAL SIGNS: ED Triage Vitals  Enc Vitals Group     BP 19-Aug-2017 0949 103/60     Pulse Rate 08/19/17 0949 85     Resp 08-19-2017 0949 20     Temp 08/19/17 0949 (!) 97.3 F (36.3 C)     Temp Source 08/19/2017 0949  Axillary     SpO2 --      Weight 08-19-2017 0944 160 lb (72.6 kg)     Height 08-19-17 0943 5\' 7"  (1.702 m)     Head Circumference --      Peak Flow --      Pain Score --      Pain Loc --      Pain Edu? --      Excl. in GC? --     Constitutional: Alert but disoriented, no distress  Eyes: Conjunctivae are normal. Normal extraocular movements.  Pupils are 3 mm and equal ENT   Head: Normocephalic, chin laceration is noted   Nose: No congestion/rhinnorhea.   Mouth/Throat: Mucous membranes are moist.   Neck: No stridor. Cardiovascular: Normal rate, regular rhythm. No murmurs, rubs, or gallops. Respiratory: Normal respiratory effort without tachypnea nor retractions. Breath sounds are clear and equal bilaterally. No wheezes/rales/rhonchi. Gastrointestinal: Soft and nontender. Normal bowel sounds Musculoskeletal: Nontender with normal range of motion in extremities. No lower extremity tenderness nor edema. Neurologic: Confused, moving all of his extremities,  neuro exam is difficult.   Skin: Y-shaped chin laceration just to the left of midline. Psychiatric: Mood and affect are normal.  ____________________________________________  EKG: Interpreted by me.  Sinus rhythm the rate of 85 bpm, prolonged PR interval, possible RVH, T wave abnormalities, normal QT  ____________________________________________  ED COURSE:  As part of my medical decision making, I reviewed the following data within the electronic MEDICAL RECORD NUMBER History obtained from family if available, nursing notes, old chart and ekg, as well as notes from prior ED visits. Patient presented for altered mental status and falls, we will assess with labs and imaging as indicated at this time. Clinical Course as of Aug 14 1150  Mon Aug 14, 2017  1113 Family is declining his CT imaging and are inquiring about hospice home care.  [JW]    Clinical Course User Index [JW] Kenneth Filbert, MD   ..Laceration  Repair Date/Time: 08/05/2017 9:57 AM Performed by: Kenneth Filbert, MD Authorized by: Kenneth Filbert, MD   Consent:    Consent obtained:  Emergent situation Anesthesia (see MAR for exact dosages):    Anesthesia method:  None Laceration details:    Location:  Face   Face location:  Chin   Length (cm):  2.5   Depth (mm):  5 Repair type:    Repair type:  Intermediate Treatment:    Irrigation solution:  Tap water   Irrigation method:  Tap   Visualized foreign bodies/material removed: no   Skin repair:    Repair method:  Tissue adhesive Post-procedure details:    Dressing:  Open (no dressing)   Patient tolerance of procedure:  Tolerated well, no immediate complications   ____________________________________________   LABS (pertinent positives/negatives)  Labs Reviewed  CBC WITH DIFFERENTIAL/PLATELET - Abnormal; Notable for the following components:      Result Value   RBC 3.45 (*)    Hemoglobin 10.6 (*)    HCT 34.4 (*)    MCHC 31.0 (*)    RDW 21.9 (*)    Lymphs Abs 0.4 (*)    All other components within normal limits  COMPREHENSIVE METABOLIC PANEL - Abnormal; Notable for the following components:   Potassium 3.4 (*)    Chloride 94 (*)    CO2 34 (*)    Glucose, Bld 150 (*)    Creatinine, Ser 2.87 (*)    Calcium 8.2 (*)    Albumin 2.8 (*)    ALT 7 (*)    GFR calc non Af Amer 18 (*)    GFR calc Af Amer 21 (*)    All other components within normal limits  TROPONIN I - Abnormal; Notable for the following components:   Troponin I 0.09 (*)    All other components within normal limits    RADIOLOGY Images were viewed by me  Chest x-ray  IMPRESSION: Moderate to moderately large pleural effusions are seen, greater on the left. Left effusion is increased since the prior exam. Associated compressive basilar atelectasis noted.  Cardiomegaly without edema. ____________________________________________  DIFFERENTIAL DIAGNOSIS   CVA, dementia, MI,  dehydration, occult infection, electrolyte abnormality, medication side effect, medication noncompliance  FINAL ASSESSMENT AND PLAN  Fall, chin laceration, dementia, end-stage renal disease on dialysis, pleural effusions   Plan: Patient had presented for falling with hallucinations and a chin laceration. Patient's labs are as expected with end-stage renal disease on dialysis. Patient's imaging revealed worsening pleural effusions.  Family has decided to make him comfort measures and we were unable to  place him into the hospice home so we will admit him to the hospital here for palliative care.  He is on the wait list for the hospice home.   Kenneth FilbertWilliams, Torrie Namba E, MD   Note: This note was generated in part or whole with voice recognition software. Voice recognition is usually quite accurate but there are transcription errors that can and very often do occur. I apologize for any typographical errors that were not detected and corrected.     Kenneth FilbertWilliams, Ysidra Sopher E, MD 12-26-2016 [phone]

## 2017-08-22 NOTE — Death Summary Note (Signed)
During RN rounds, patient pale, unresponsive.  No heart or lung sounds auscultated.  Verified with second RN.  Family, [facility]reddy, [facility], [facility] notified.  Family wishes to visit in room before funeral home picks up body.  Per wife, Kenneth [name], funeral home will be [employer] [place].  AC will meet w/ family when they arrive.  [facility] ruled out as potential donor.  Body prep complete, PIV and  removed, body cleaned up.

## 2017-08-22 NOTE — Discharge Summary (Signed)
Death summary  Date of admission 07/31/2017 Date of death Feb 04, 2017  History of present illness Kenneth [name]  is a 84 y.o. male with a known history of end-stage renal disease,coronary artery disease, dementia, B12 deficiency, arthritis and multiple other medical problems is brought into the ED after he sustained a fall. Patient admitted to the hospital with a chin laceration but bleeding was well controlled by the time of arrival. According to the family members patient is frequently falling and hallucinating more. Family has discussed with the ED physician and nephrologist and decided to pursue comfort care measures. Hospitalist team is called to admit the patient is hospice beds are not available. Daughter who is the healthcare power of attorney is at bedside and agreeable with comfort care. Patient is altered from baseline dementia  Hospital course  Encephalopathy with underlying chronic dementia # FTT in adult with frequent falls  End-stage renal disease  Coronary artery disease Obstructive sleep apnea Diabetes mellitus  Patient's daughter who is the healthcare power of attorney has requested  comfort care measures. provided oxygen as needed Morphine as needed for anxiety, pain and air hunger Tylenol as needed Discontinued checking vital signs and blood work and other investigations Goal is to keep the patient as comfortable as possible Consult placed to hospice care and social work for placement to hospice home when bed is available  Strict comfort care measures implemented and patient was deceased today a.m. comfortably

## 2017-08-22 DEATH — deceased

## 2018-03-25 IMAGING — DX DG CHEST 1V PORT
1 series · 1 of 1 positions shown · non-contrast
Comparison: Chest radiograph - 02/15/2017 ; 01/24/2017;
ultrasound-guided left-sided thoracentesis - 02/15/2017

CLINICAL DATA: Post right-sided thoracentesis.

Patient with history of recent left-sided thoracentesis performed on
02/15/2017 with associated postprocedural asymptomatic ex vacuo
pneumothorax.
EXAM:
PORTABLE CHEST 1 VIEW

[chest ap]
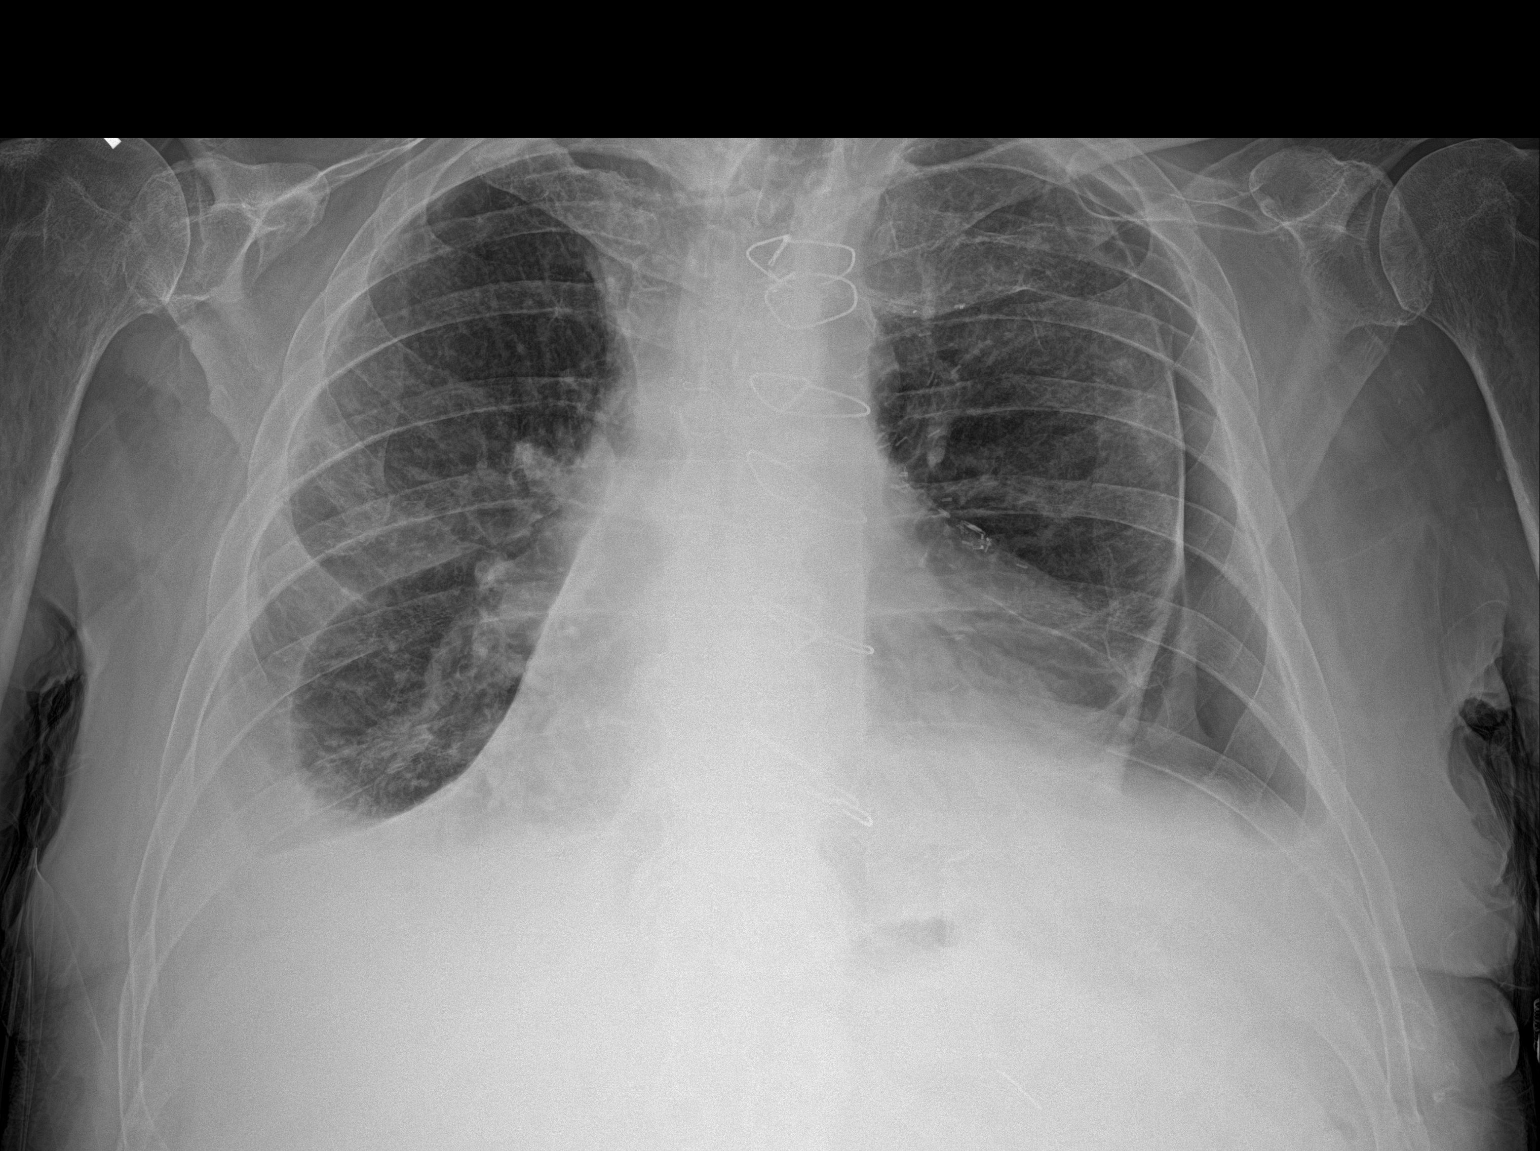

[1 of 1 positions shown; findings below may reference images not displayed]

FINDINGS: Grossly unchanged cardiac silhouette and mediastinal contours post
median sternotomy.

Interval reduction in persistent small right-sided pleural effusion
post thoracentesis. No pneumothorax.

Interval increase in small recurrent left-sided pleural effusion
with persistent incomplete re-expansion of the left lung with
associated residual ex vacuo pneumothorax. No mediastinal shift.
Pulmonary vasculature remains indistinct with cephalization of flow.
A small amount of fluid is again seen tracking within the right
major fissure. No definite acute osseus abnormalities.
IMPRESSION: 1. Interval reduction in persistent small right-sided effusion post
thoracentesis. No evidence of right-sided pneumothorax.
2. Interval reaccumulation of a small left-sided effusion with
residual left-sided ex vacuo pneumothorax.
3. Similar findings of cardiomegaly and pulmonary edema.

## 2018-06-23 IMAGING — CR DG CHEST 1V
1 series · 1 of 1 positions shown · non-contrast
Comparison: 04/19/2017

CLINICAL DATA: 89-year-old male

EXAM:
CHEST 1 VIEW

[dg chest 1 view]
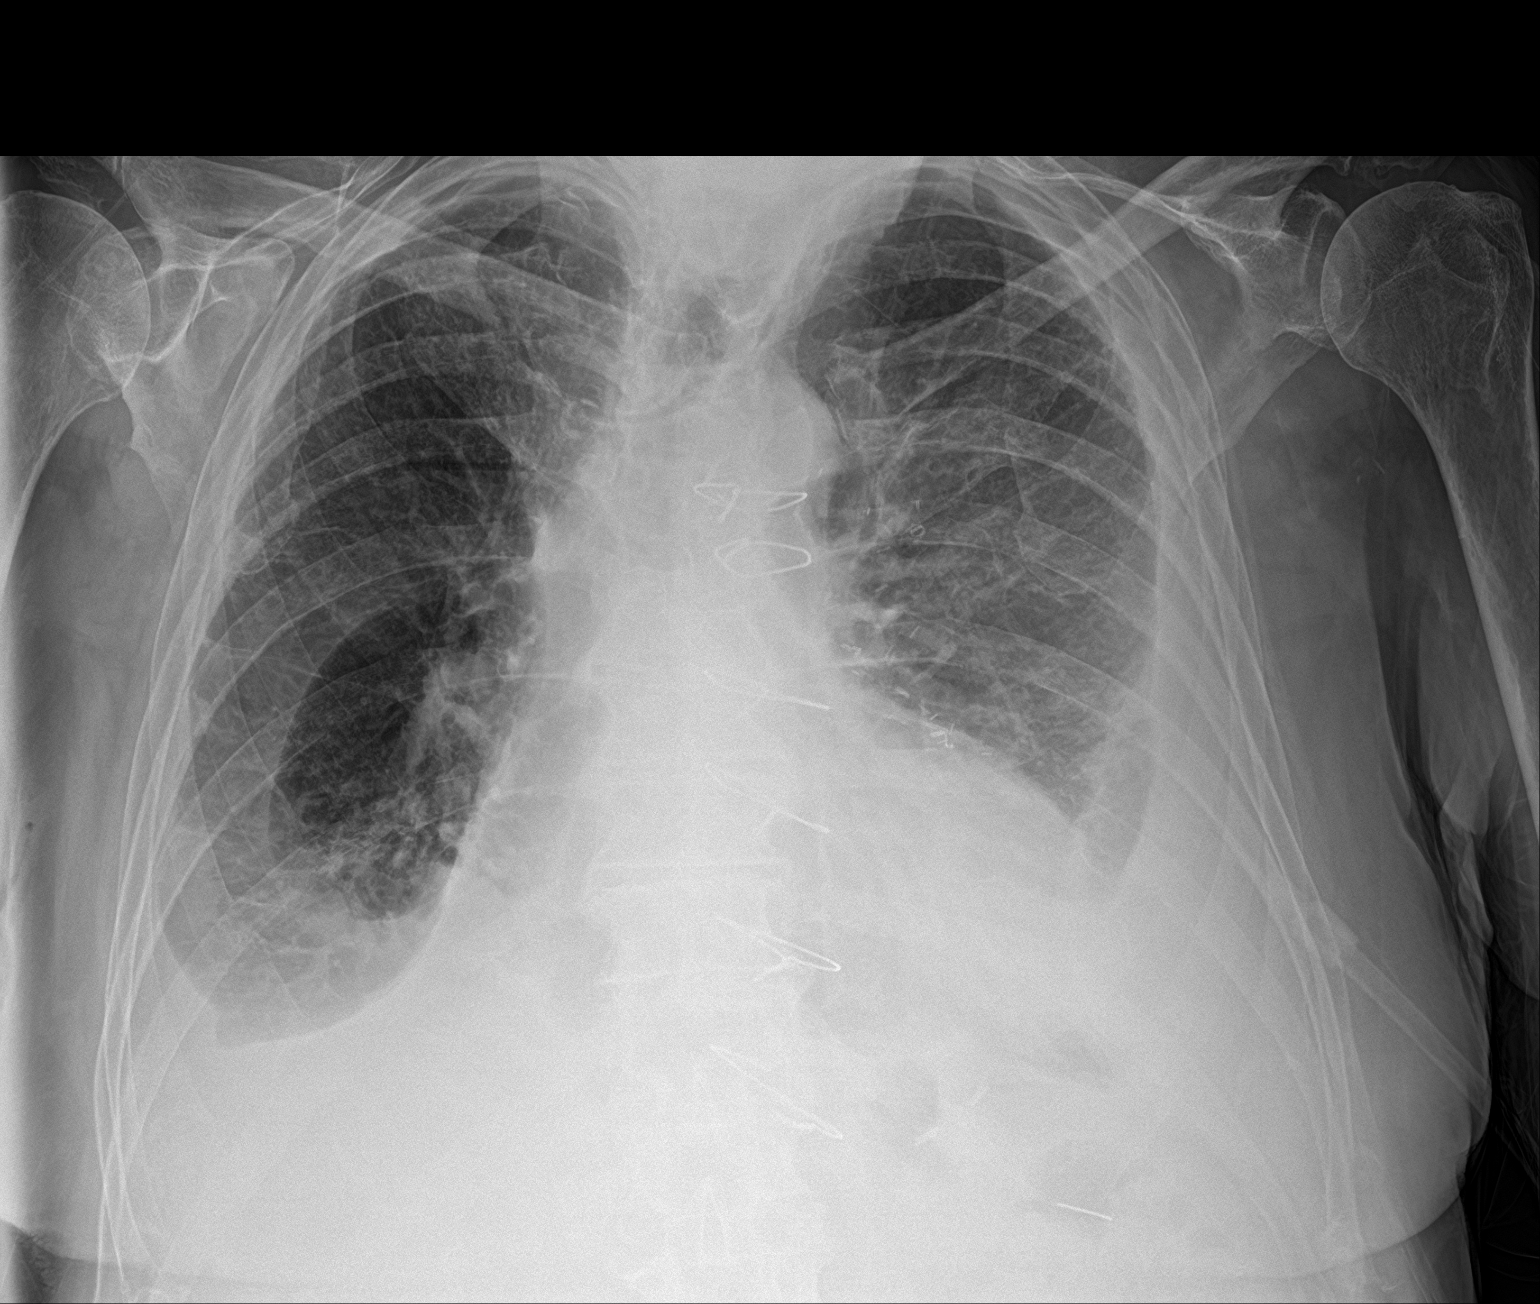

[1 of 1 positions shown; findings below may reference images not displayed]

FINDINGS: Cardiomediastinal silhouette unchanged in size and contour.

Surgical changes of median sternotomy and CABG.

Opacities at the bilateral lung bases with left-sided
pleuroparenchymal thickening, unchanged from prior. Opacity obscures
the bilateral hemidiaphragm and heart borders.

No pneumothorax.

Similar appearance of coarsened interstitial markings with
interlobular septal thickening.
IMPRESSION: No evidence of pneumothorax status post thoracentesis.

Persisting bilateral pleural effusion with associated atelectasis.
On the left fluid appears partially loculated, unchanged from prior.

## 2019-02-04 IMAGING — US US ABDOMEN LIMITED
1 series · 6 of 6 positions shown · non-contrast
Comparison: Chest radiograph- 02/15/2017; ultrasound-guided
left-sided thoracentesis -02/16/2027

CLINICAL DATA: Shortness of breath.  Concern for ascites.

EXAM:
LIMITED ABDOMEN ULTRASOUND FOR ASCITES
TECHNIQUE: Limited ultrasound survey for ascites was performed in all four
abdominal quadrants.

[Series 1: us abdomen limited · 0.31mm/px · 6 of 6 slices shown]
[im 1/6]
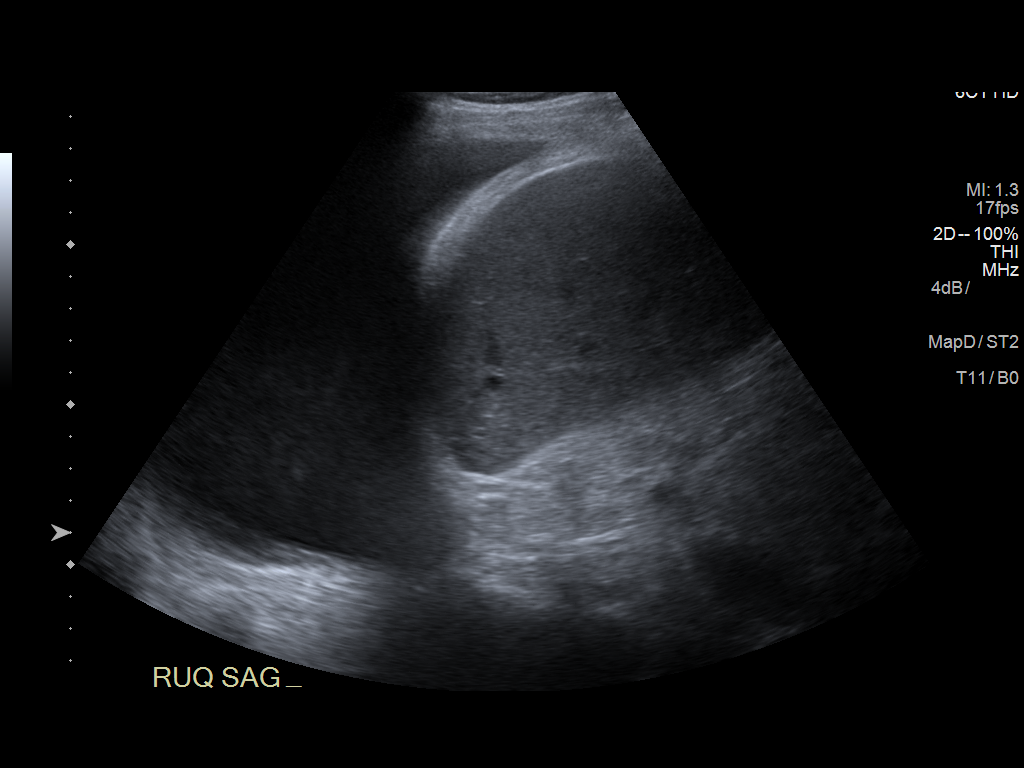
[im 2/6]
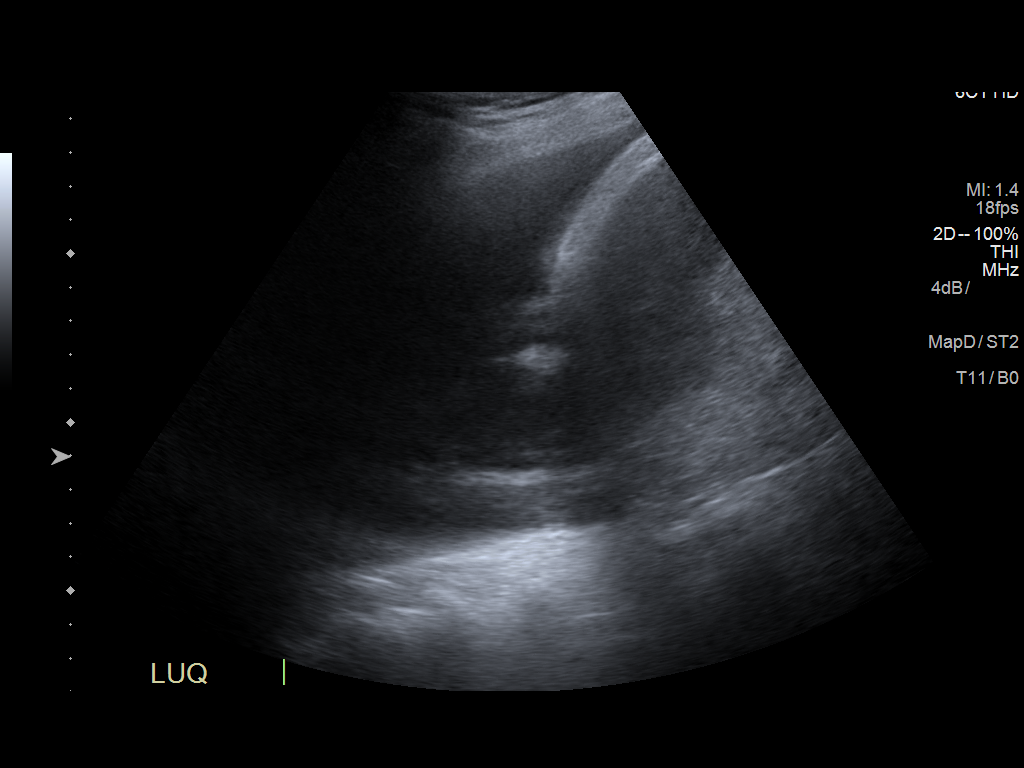
[im 3/6]
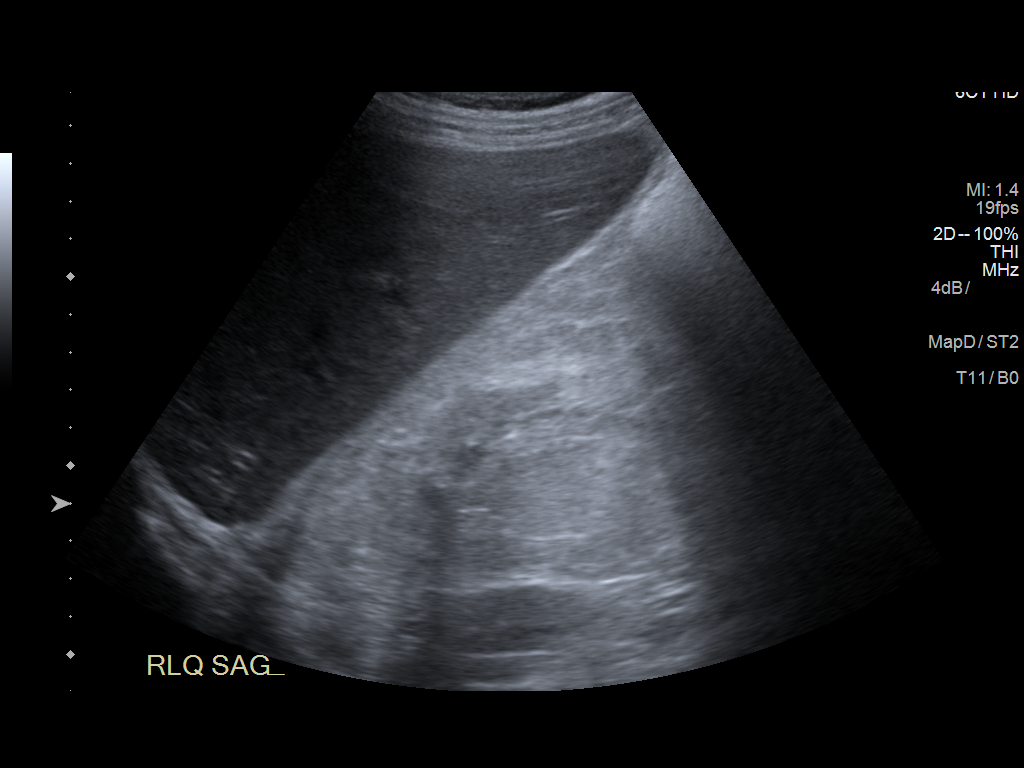
[im 4/6]
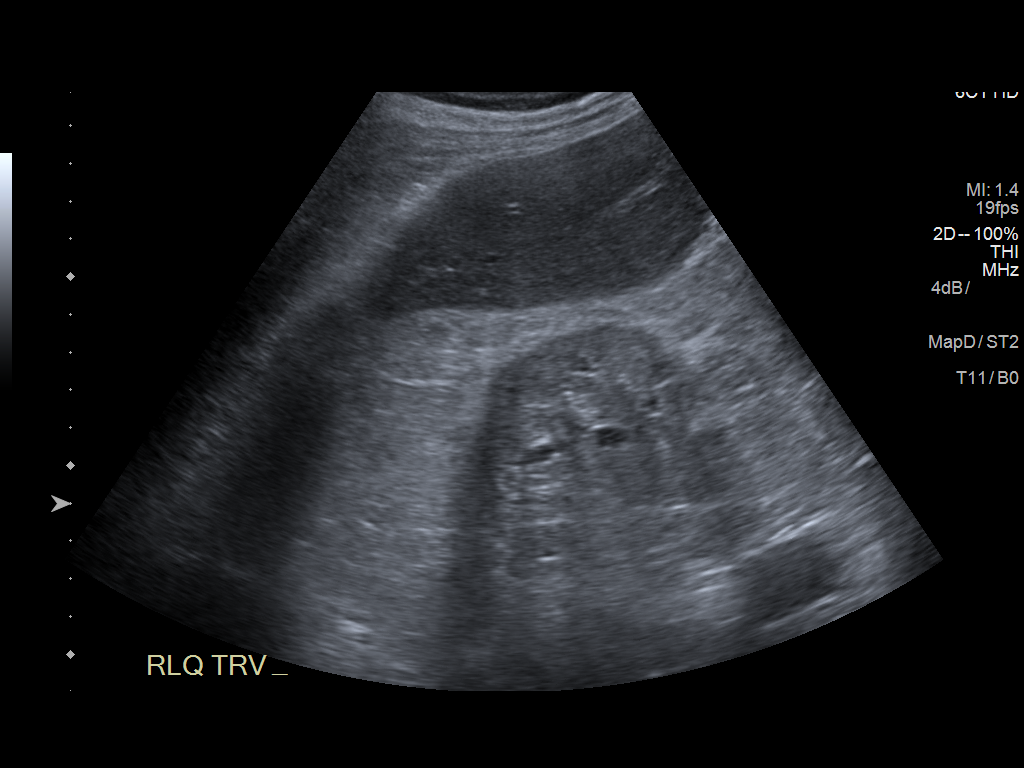
[im 5/6]
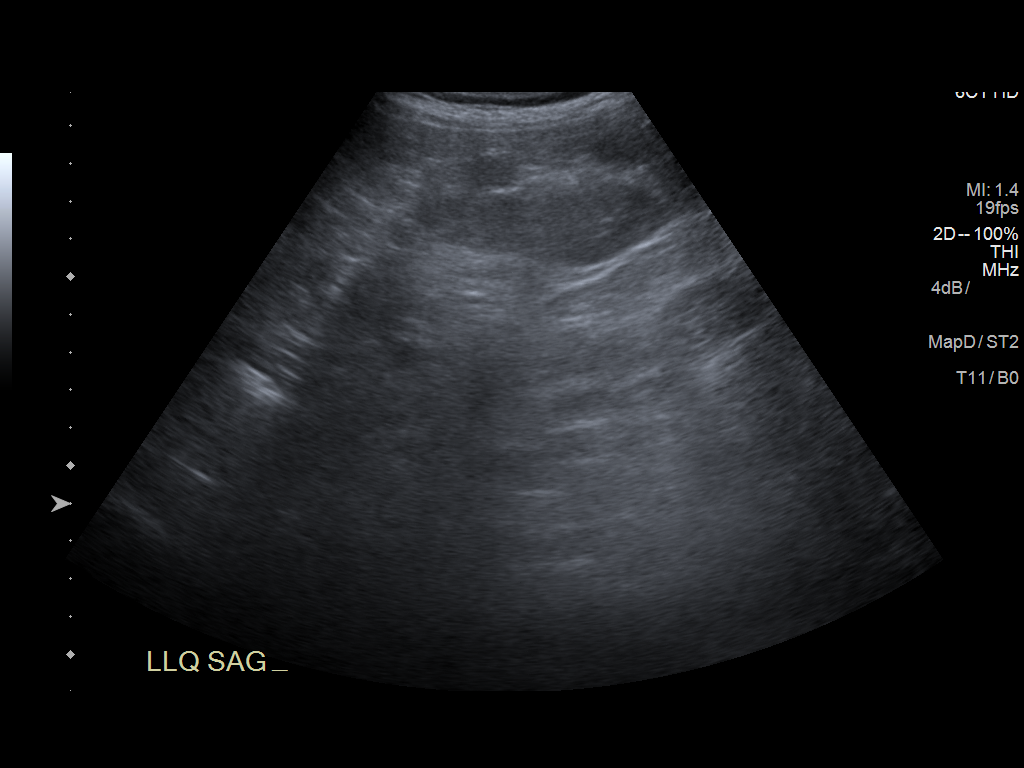
[im 6/6]
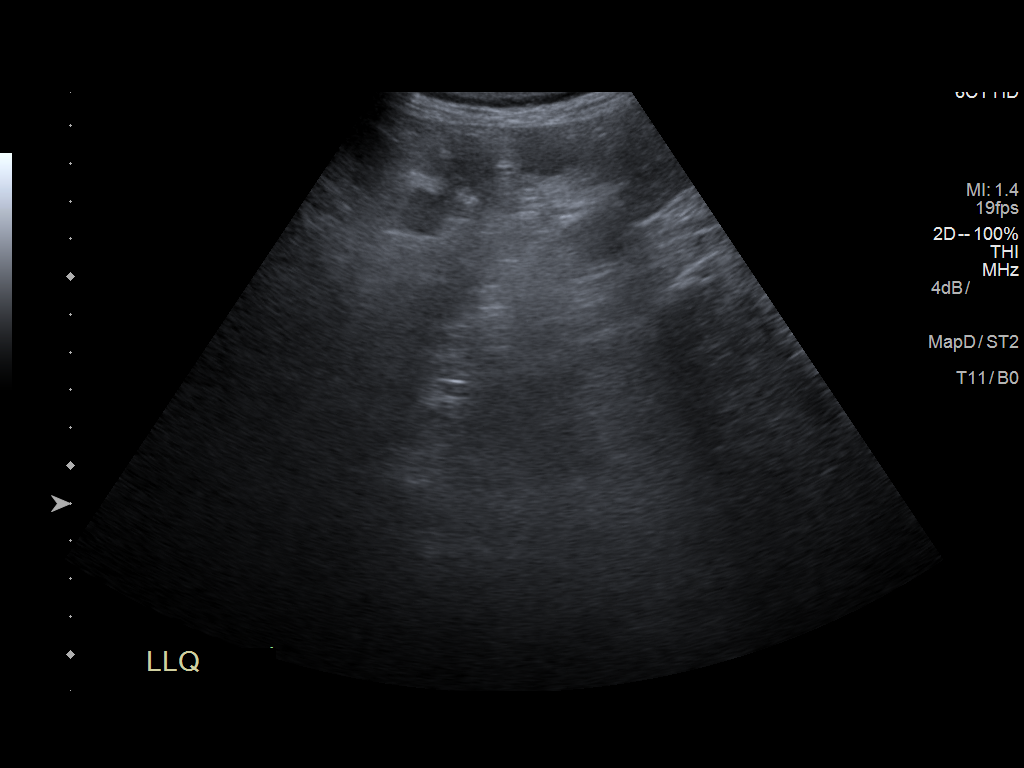

[6 of 6 positions shown; findings below may reference images not displayed]

FINDINGS: Sonographic evaluation of 4 quadrants of the abdomen are negative
for any intra-abdominal ascites.

Note is made of at least moderate size bilateral pleural effusions
(images 3 and 4).
IMPRESSION: 1. No intra-abdominal ascites.  No paracentesis performed.
2. At least moderate sized bilateral pleural effusions.
Above findings were discussed with Dr. Pulley and the decision was
made to proceed with ultrasound-guided right-sided thoracentesis.

## 2019-02-04 IMAGING — US US THORACENTESIS ASP PLEURAL SPACE W/IMG GUIDE
1 series · 6 of 6 positions shown · non-contrast
Comparison: Chest radiograph - 02/15/2017;

INDICATION: History of end-stage renal disease with recurrent symptomatic
bilateral pleural effusions.

Note, previous left-sided thoracentesis performed on 02/15/2017 was
complicated by development of an asymptomatic left-sided ex vacuo
pneumothorax.
Patient presents today for ultrasound-guided right-sided
thoracentesis.
EXAM:
US THORACENTESIS ASP PLEURAL SPACE W/IMG GUIDE
TECHNIQUE: Informed written consent was obtained from the patient after a
discussion of the risks, benefits and alternatives to treatment. A
timeout was performed prior to the initiation of the procedure.

[Series 1: us thoracentesis asp pleural space w/img guide · 0.27mm/px · 6 of 6 slices shown]
[im 1/6]
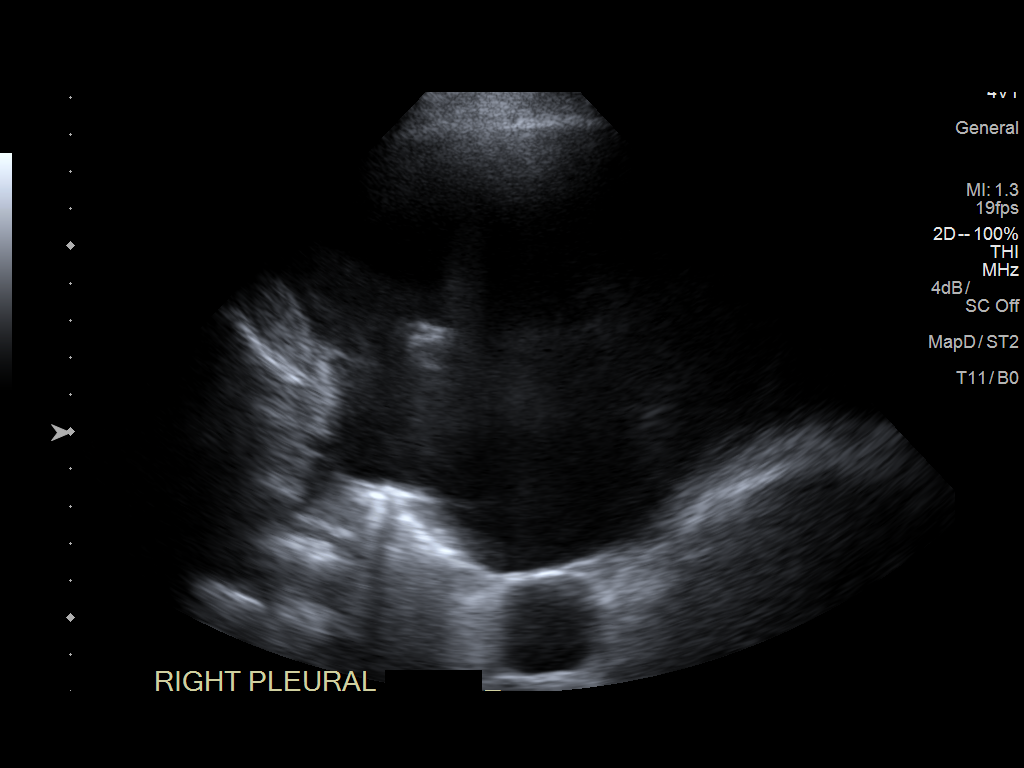
[im 2/6]
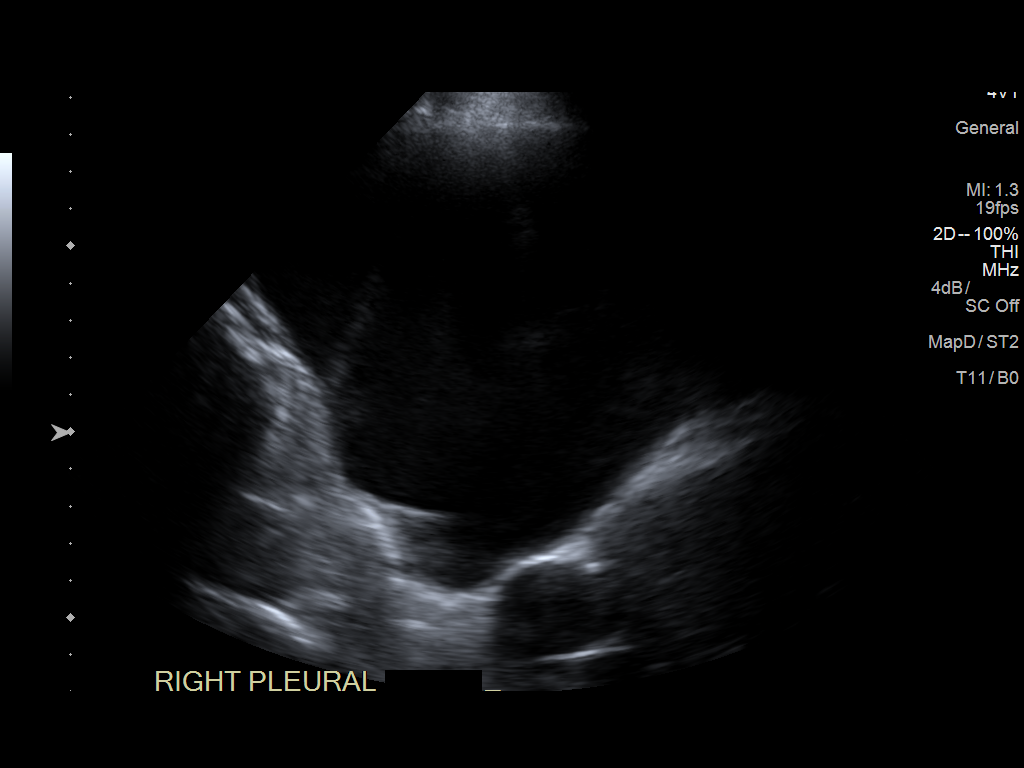
[im 3/6]
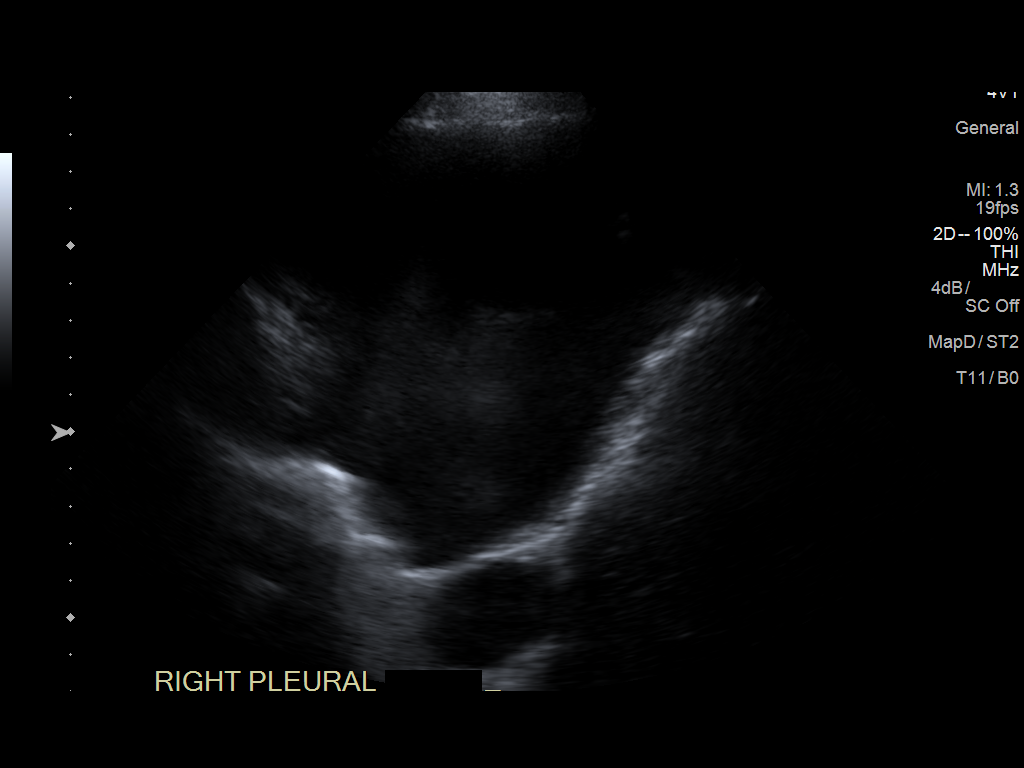
[im 4/6]
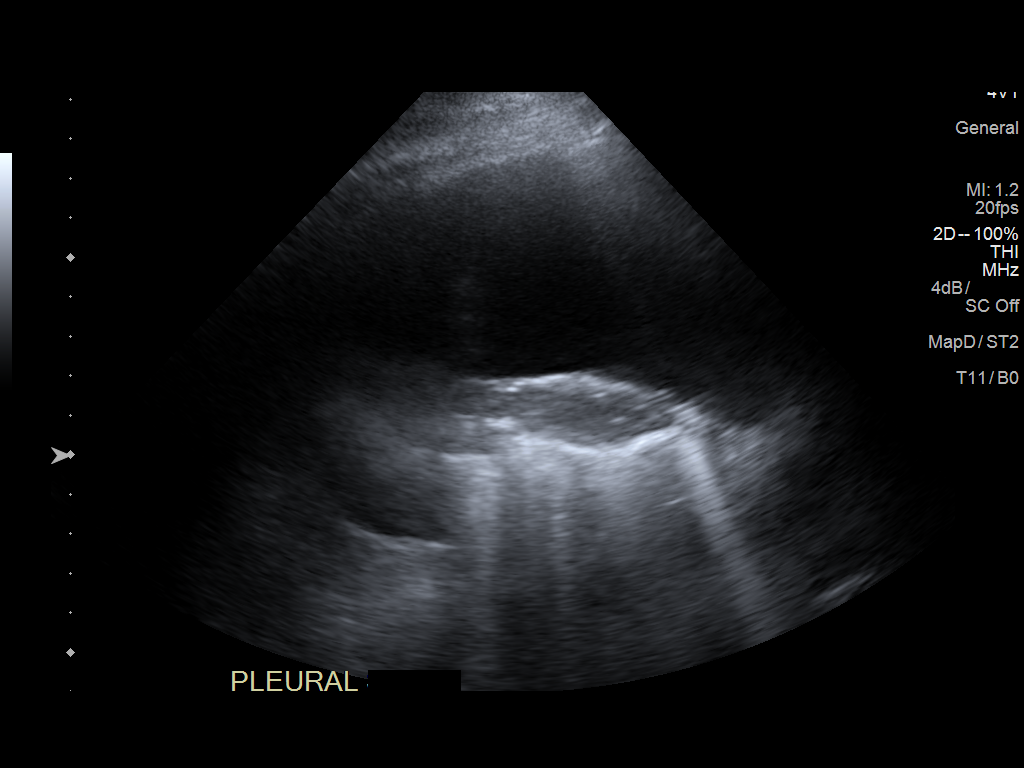
[im 5/6]
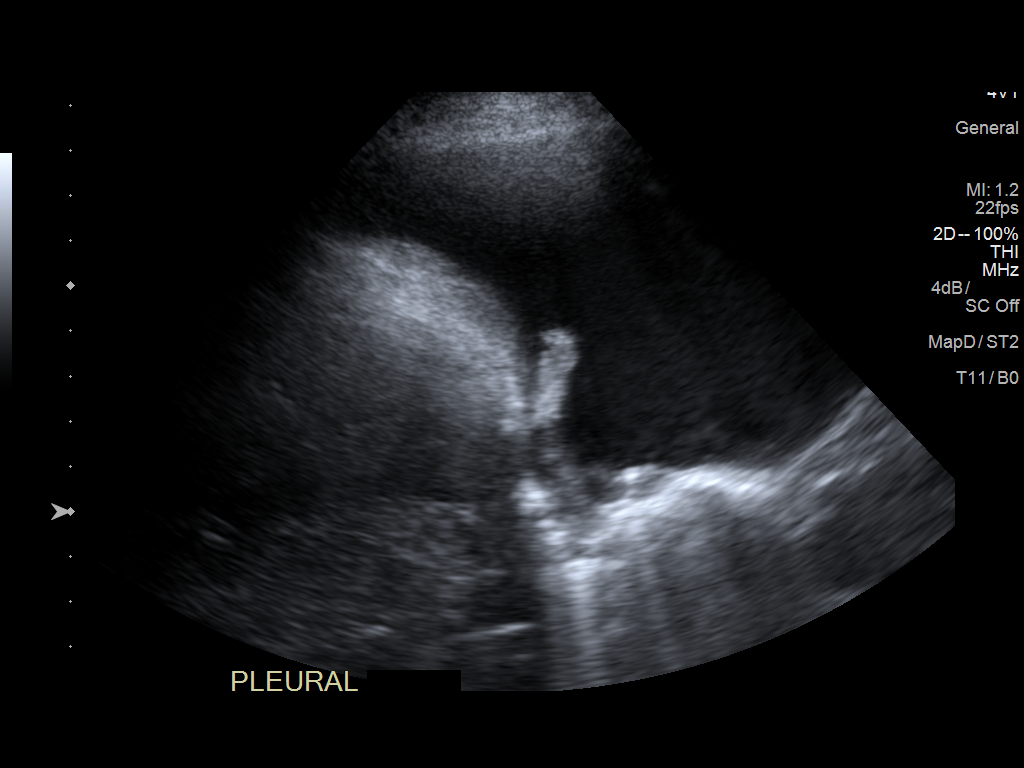
[im 6/6]
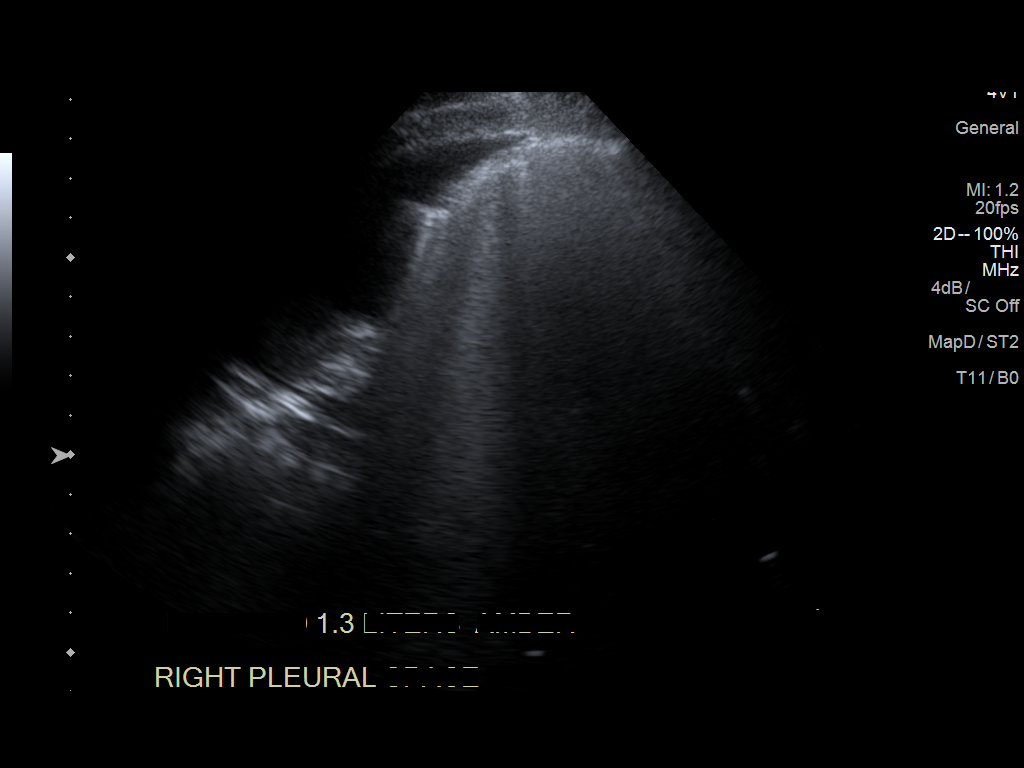

[6 of 6 positions shown; findings below may reference images not displayed]

01/24/2017;
ultrasound-guided left-sided thoracentesis - 02/15/2017

MEDICATIONS:
None.

COMPLICATIONS:
None immediate.
Initial ultrasound scanning demonstrates a moderate to large size
right-sided pleural effusion. The lower chest was prepped and draped
in the usual sterile fashion. 1% lidocaine was used for local
anesthesia. An ultrasound image was saved for documentation
purposes. An 8 Fr Safe-T-Centesis catheter was introduced. The
thoracentesis was performed. The catheter was removed and a dressing
was applied. The patient tolerated the procedure well without
immediate post procedural complication. The patient was escorted to
have an upright chest radiograph.
FINDINGS: A total of approximately 1.3 liters of serous fluid was removed.
IMPRESSION: Successful ultrasound-guided right sided thoracentesis yielding
liters of pleural fluid.
